# Patient Record
Sex: Male | Born: 1960 | Race: White | Hispanic: No | Marital: Married | Smoking: Never smoker
Health system: Southern US, Community
[De-identification: ages and names within clinical notes are randomized; demographics above are authoritative.]

## PROBLEM LIST (undated history)

## (undated) DIAGNOSIS — E785 Hyperlipidemia, unspecified: Secondary | ICD-10-CM

## (undated) DIAGNOSIS — I219 Acute myocardial infarction, unspecified: Secondary | ICD-10-CM

## (undated) DIAGNOSIS — I5022 Chronic systolic (congestive) heart failure: Secondary | ICD-10-CM

## (undated) DIAGNOSIS — I2510 Atherosclerotic heart disease of native coronary artery without angina pectoris: Secondary | ICD-10-CM

## (undated) DIAGNOSIS — Z95810 Presence of automatic (implantable) cardiac defibrillator: Secondary | ICD-10-CM

## (undated) HISTORY — DX: Hyperlipidemia, unspecified: E78.5

## (undated) HISTORY — DX: Acute myocardial infarction, unspecified: I21.9

## (undated) HISTORY — PX: CARDIAC CATHETERIZATION: SHX172

## (undated) HISTORY — DX: Atherosclerotic heart disease of native coronary artery without angina pectoris: I25.10

## (undated) HISTORY — DX: Chronic systolic (congestive) heart failure: I50.22

## (undated) HISTORY — PX: CAROTID STENT INSERTION: SHX5766

## (undated) HISTORY — DX: Presence of automatic (implantable) cardiac defibrillator: Z95.810

## (undated) HISTORY — PX: VASECTOMY: SHX75

## (undated) HISTORY — PX: CHOLECYSTECTOMY: SHX55

---

## 2002-08-27 ENCOUNTER — Ambulatory Visit (HOSPITAL_COMMUNITY): Admission: RE | Admit: 2002-08-27 | Discharge: 2002-08-27 | Payer: Self-pay | Admitting: Family Medicine

## 2002-08-27 ENCOUNTER — Encounter: Payer: Self-pay | Admitting: Family Medicine

## 2003-07-18 ENCOUNTER — Ambulatory Visit (HOSPITAL_COMMUNITY): Admission: RE | Admit: 2003-07-18 | Discharge: 2003-07-19 | Payer: Self-pay | Admitting: Interventional Cardiology

## 2005-11-24 DIAGNOSIS — I219 Acute myocardial infarction, unspecified: Secondary | ICD-10-CM

## 2005-11-24 HISTORY — PX: CORONARY ARTERY BYPASS GRAFT: SHX141

## 2005-11-24 HISTORY — DX: Acute myocardial infarction, unspecified: I21.9

## 2005-12-06 ENCOUNTER — Inpatient Hospital Stay (HOSPITAL_COMMUNITY): Admission: EM | Admit: 2005-12-06 | Discharge: 2005-12-24 | Payer: Self-pay | Admitting: Emergency Medicine

## 2005-12-06 ENCOUNTER — Ambulatory Visit: Payer: Self-pay | Admitting: Pulmonary Disease

## 2005-12-11 ENCOUNTER — Encounter (INDEPENDENT_AMBULATORY_CARE_PROVIDER_SITE_OTHER): Payer: Self-pay | Admitting: Interventional Cardiology

## 2006-01-09 ENCOUNTER — Encounter (HOSPITAL_COMMUNITY): Admission: RE | Admit: 2006-01-09 | Discharge: 2006-04-09 | Payer: Self-pay | Admitting: Interventional Cardiology

## 2006-04-26 HISTORY — PX: CARDIAC DEFIBRILLATOR PLACEMENT: SHX171

## 2006-05-16 ENCOUNTER — Inpatient Hospital Stay (HOSPITAL_COMMUNITY): Admission: RE | Admit: 2006-05-16 | Discharge: 2006-05-17 | Payer: Self-pay | Admitting: Cardiology

## 2007-12-03 ENCOUNTER — Encounter: Admission: RE | Admit: 2007-12-03 | Discharge: 2007-12-03 | Payer: Self-pay | Admitting: Gastroenterology

## 2008-03-01 ENCOUNTER — Encounter (INDEPENDENT_AMBULATORY_CARE_PROVIDER_SITE_OTHER): Payer: Self-pay | Admitting: General Surgery

## 2008-03-01 ENCOUNTER — Ambulatory Visit (HOSPITAL_COMMUNITY): Admission: RE | Admit: 2008-03-01 | Discharge: 2008-03-02 | Payer: Self-pay | Admitting: General Surgery

## 2008-03-10 ENCOUNTER — Observation Stay (HOSPITAL_COMMUNITY): Admission: RE | Admit: 2008-03-10 | Discharge: 2008-03-11 | Payer: Self-pay | Admitting: Internal Medicine

## 2008-03-10 ENCOUNTER — Ambulatory Visit: Payer: Self-pay | Admitting: Internal Medicine

## 2010-11-09 ENCOUNTER — Encounter (INDEPENDENT_AMBULATORY_CARE_PROVIDER_SITE_OTHER): Payer: Self-pay | Admitting: *Deleted

## 2010-11-13 NOTE — Letter (Signed)
Summary: Appointment - Reminder 2  [facility] [employer], Main Office  [address]   [place], [place] [address]   Phone: [phone]  Fax: [phone]     November 09, 2010 MRN: [id number]   NUSSEN PULLIN [address] [place], [place]  [address]   Dear Mr. Wildey,  Our records indicate that it is time to schedule a follow-up appointment.  Dr.Taylor recommended that you follow up with [place] in April. It is very important that we reach you to schedule this appointment. We look forward to participating in your health care needs. Please contact us at the number listed above at your earliest convenience to schedule your appointment.  If you are unable to make an appointment at this time, give [place] a call so we can update our records.     Sincerely,   Glass blower/designer

## 2011-01-08 NOTE — Op Note (Signed)
NAME:  [name] [name], [name] [name]             ACCOUNT NO.:  [id number]   MEDICAL RECORD NO.:  [id number]          PATIENT TYPE:  OIB   LOCATION:  6522                         FACILITY:  [facility]   PHYSICIAN:  Doylene Canning. [name], MD    DATE OF BIRTH:  [birth year]   DATE OF PROCEDURE:  03/10/2008  DATE OF DISCHARGE:                               OPERATIVE REPORT   PROCEDURE PERFORMED:  Implantable cardioverter-defibrillator lead  revision with insertion of a new lead implantable cardioverter-  defibrillator pocket revision and removal insertion of a previously  implanted defibrillator with defibrillation threshold testing.   INTRODUCTION:  The patient is a 50 year old man with a longstanding  coronary artery disease status post MI with shock resulted in bypass  surgery.  He is left with an EF of 25-30%.  The patient underwent  prophylactic defibrillator insertion not quite 2 years ago.  Less than a  month after his implant, it was learned that his defibrillator lead  which was a 69/49 Sprint Fidelis was under recall notification.  The  device was re-programmed and the patient was stable for almost 2 years.  The patient was subsequently alert and was found to have evidence of  pending lead fracture as he had had multiple short count at V to V  intervals and a marked change in his sensing parameters and is now  referred for ICD lead revision.  After careful discussion with the  procedure, it was deemed most appropriate to place a new defibrillator  lead provided that his left subclavian vein was still in fact patent and  utilize the old device for pacing, sensing, and defibrillation.   PROCEDURE:  After informed consent was obtained, the patient was taken  to the Diagnostic EP Lab in a fasting state.  After usual preparation  and draping, 10 mL of contrast was injected into the left upper  extremity venous system, in fact demonstrated a patent left subclavian  vein.  On accomplishing this, 30 mL of  lidocaine was infiltrated into  the left infraclavicular region.  A 7-cm incision was carried out over  this region and electrocautery was utilized to dissect down to the  fascial plane.  Care was taken not to enter the previously implanted ICD  pocket.  At this point, the left subclavian vein was punctured and the  Medtronic 6947 Sprint Quattro Secure active fixation defibrillation lead  was advanced into the right ventricle.  It should be noted that there  were fairly modest adhesions making passage of the defibrillation lead  more difficult than usual but after multiple attempts, the lead was able  to be passed without complication.  The defibrillator lead was  subsequently advanced into the right ventricle.  The old device was  placed into the floor of the right ventricle and then the new device was  placed on the RV septum where the R-waves with the lead actively fixed  were 14 mV and the pacing impedance was 600 ohms.  The threshold was 0.7  volts at 0.5 milliseconds.  A 10-volt pacing did not stimulate the  diaphragm.  With the defibrillator lead  in satisfactory position, it was  secured to the subpectoralis fascia with a figure-of-eight silk suture.  The sewing sleeve was also secured with silk suture.  At this point, the  pocket was irrigated with kanamycin and next, the electrocautery was  utilized to enter the subcutaneous pocket.  The defibrillator pocket was  revised to accommodate the additional defibrillation lead.  The old  defibrillation lead was capped at all three ports and the old lead  placed back in the subcutaneous pocket.  There was a moderate amount of  adhesions in the pocket on the lead.  At this point, pocket revision was  carried out to accommodate the additional defibrillation lead and  electrocautery was utilized to assure hemostasis.  The pocket was then  irrigated with copious amounts of kanamycin.  The Medtronic Virtuoso  single chamber defibrillator which  had been previously removed from the  pocket was then reinserted into the pocket into the new defibrillation  lead.  The pocket was irrigated with kanamycin.  The patient was deeply  sedated with fentanyl, Versed, and Valium for defibrillation threshold  testing.   After the patient was deeply sedated with fentanyl, Versed and Valium,  VF was subsequently induced with a T-wave shock and a 15 joules shock  was delivered which terminated VF and restored sinus rhythm.  No  additional defibrillation threshold testing was carried out at this  point.  The incision was closed with 2-0 Vicryl, followed by layer of 3-  0 Vicryl.  Benzoin was painted on the skin.  Steri-Strips were placed.  A pressure dressing was made and manual pressure was held for at least 5  minutes after which a very tight pressure dressing was placed and the  patient was returned to his room in satisfactory condition.   COMPLICATIONS:  There were no immediate procedural complications.   RESULTS:  Demonstrated successful removal and insertion of the previous  Medtronic Virtuoso defibrillator along with defibrillation threshold  testing and pocket revision and insertion of a new defibrillator lead in  a patient whose previous defibrillator lead had shown evidence of  impending fracture based on changes in the pacing parameters and  multiple short counted V to V intervals.      Doylene Canning. [name], MD  Electronically Signed     GWT/MEDQ  D:  03/10/2008  T:  03/11/2008  Job:  [id number]   cc:   Francisca December, M.D.  Lyn Records, M.D.

## 2011-01-08 NOTE — Op Note (Signed)
NAME:  [name] [name], [name] [name]             ACCOUNT NO.:  [id number]   MEDICAL RECORD NO.:  [id number]          PATIENT TYPE:  OIB   LOCATION:  3729                         FACILITY:  [facility]   PHYSICIAN:  Angelia Mould. Derrell Lolling, M.D.DATE OF BIRTH:  11-11-1960   DATE OF PROCEDURE:  03/01/2008  DATE OF DISCHARGE:                               OPERATIVE REPORT   PREOPERATIVE DIAGNOSIS:  Chronic cholecystitis with cholelithiasis.   POSTOPERATIVE DIAGNOSIS:  Chronic cholecystitis with cholelithiasis.   OPERATION PERFORMED:  Laparoscopic cholecystectomy with intraoperative  cholangiogram.   SURGEON:  Angelia Mould. Derrell Lolling, MD   FIRST ASSISTANT:  Gabrielle Dare. Janee Morn, MD   OPERATIVE INDICATION:  This is a 50 year old white man who has a past  history of acute myocardial infarction, cardiogenic shock, emergent  coronary artery bypass grafting in 2007, also with a pacemaker and  internal defibrillator in place.  Other than that, he has been extremely  healthy except for the past 3-1/2 months he had been having intermittent  episodes of biliary colic which had been fairly classic in nature.  Dr.  Dorena Cookey evaluated him, and a gallbladder ultrasound revealed several  gallstones and borderline thickening of the gallbladder wall.  He has  had normal liver function test.  Dr. Verdis Prime has cleared him for  surgery, and he has been off his Plavix for 7 days and aspirin for 3-4  days and is brought to the operating room electively.   OPERATIVE FINDINGS:  The gallbladder was chronically inflamed.  There  were moderately severe adhesions to the body and infundibulum of the  gallbladder that took about 15 minutes to take down.  The liver,  stomach, duodenum, small intestine, and large intestine were otherwise  normal to inspection.  The cholangiogram was normal, showing normal  intrahepatic and extrahepatic bile ducts, no filling defects, and no  obstruction with good flow of contrast into the duodenum.   OPERATIVE TECHNIQUE:  Following the induction of general endotracheal  anesthesia, the patient was identified as to correct patient and correct  procedure.  The abdomen was prepped and draped in sterile fashion.  Intravenous antibiotics were given prior to the incision.  A 0.5%  Marcaine with epinephrine was used as a local infiltration anesthetic.  A vertically-oriented incision was made inside the lower rim of the  umbilicus.  The fascia was incised in the midline and the abdominal  cavity was entered under direct vision.  A 10-mm Hasson trocar was  inserted and secured with a pursestring suture of 0 Vicryl.  Pneumoperitoneum was created.  Video camera was inserted with  visualization and findings as described above.  A 10-mm trocar was  placed in the subxiphoid region through an old scar where he had had a  chest tube.  That insertion was uneventful.  Two 5-mm trocars were  placed in the right upper quadrant.  We could clearly identify the  fundus of the gallbladder.  We very carefully took down all of the  chronic adhesions, taking the duodenum and transverse colon and omentum  off the gallbladder.  This was done without any problem.  We ultimately  were able to identify the infundibulum and elevated and retracted  laterally.  We then incised the peritoneum on the right and the left of  the neck of the gallbladder.  We dissected out the cystic duct and  created a large window behind it.  We could visualize the common bile  duct.  A cholangiogram catheter was inserted into the cystic duct.  A  cholangiogram was obtained using the C-arm.  The cholangiogram was  normal as described above.  The cholangiogram catheter was removed, the  cystic duct secured with multiple metal clips and divided.  We then  isolated the cystic artery.  We actually isolated the anterior branch  and the posterior branch separately, secured these with metal clips and  divided these.  We were then able to dissect  the gallbladder with  electrocautery out of its bed, placed it in a specimen bag, and removed  it.  The operative field and subphrenic space were copiously irrigated  with saline.  At the completion of the case, there was no bleeding and  no bile leak whatsoever.  The irrigation fluid was completely clear.  The trocars were removed under direct vision.  There was no bleeding  from the trocar sites.  The pneumoperitoneum was released.  The fascia  at the umbilicus was closed with 0 Vicryl sutures in the skin.  Incisions were all closed with subcuticular sutures of 4-0 Monocryl and  Dermabond.  Clean bandages were placed and the patient was taken to  recovery room in stable condition.  Estimated blood loss was about 10-20  mL.  Complications none.  Sponge, needle, and instrument counts were  correct.      Angelia Mould. Derrell Lolling, M.D.  Electronically Signed     HMI/MEDQ  D:  03/01/2008  T:  03/02/2008  Job:  [id number]   cc:   Everardo All. Madilyn Fireman, M.D.  Holley Bouche, M.D.  Lyn Records, M.D.

## 2011-01-08 NOTE — Discharge Summary (Signed)
NAME:  [name] [name], [name] [name]             ACCOUNT NO.:  [id number]   MEDICAL RECORD NO.:  [id number]          PATIENT TYPE:  OIB   LOCATION:  6522                         FACILITY:  [facility]   PHYSICIAN:  Doylene Canning. [name], MD    DATE OF BIRTH:  Jan 30, 1961   DATE OF ADMISSION:  03/10/2008  DATE OF DISCHARGE:  03/11/2008                               DISCHARGE SUMMARY   Given this patient has allergy to PENICILLIN, any type of sedation gives  the patient intractable nausea.  On this admission, he was treated with  both IV Phenergan and scopolamine patch with very light conscious  sedation.   TIME FOR THIS DICTATION AND EXAM:  Greater than 35 minutes.   FINAL DIAGNOSES:  1. Discharging day #1 status post implantation of a new right      ventricular lead.      a.     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      b.     Defibrillator threshold study done this admission less than       or equal to 15 joules.  2. The patient has Medtronic Virtuoso cardioverter-defibrillator      implanted, September 2007.   SECONDARY DIAGNOSES:  1. History of a prior stent to the left anterior descending, November      2004.  2. Acute anterior myocardial infarction/cardiogenic shock/emergent      coronary artery bypass graft, April 2007.  3. Implant of Medtronic Virtuoso implantable cardioverter-      defibrillator, September 2007.  4. Stress test in the fall of 2008, ejection fraction 28%.  5. New York Heart Association class II chronic systolic congestive      heart failure.  6. Nonsustained ventricular tachycardia.  7. Treated hypothyroidism.  8. Dyslipidemia.   PROCEDURES ON THIS ADMISSION:  On March 10, 2008, disconnect of defective  6949 right ventricular lead with placement of a new right ventricular  lead with reattachment to the patient's Medtronic Virtuoso cardioverter-  defibrillator.  Defibrillator threshold study less than or equal to 15  joules,  Dr. Lewayne Bunting.  The patient has had no postprocedural  complications.   BRIEF HISTORY:  Mr. Jafri is a 50 year old male.  He called his  office, [facility], when his device started beeping at 4 o'clock  in the morning and woke him up.  The patient has a stent to the LAD in  November 2004.  He had coronary artery bypass graft surgery in the  setting of anterior myocardial infarctions and cardiogenic shock in  April 2007.  He had a defibrillator implanted, a Medtronic Virtuoso  single-chamber defibrillator in September 2007 by Dr. Amil Amen.  His  ejection fraction had not improved after the bypass surgery according to  a MUGA scan in September 2007 when it was 28%.   The patient has a device with a 6949 lead.  This morning, he reports it  started beating, he called the [facility] office.  Telephony  showed that the right ventricular lead was defective.  The patient was  advised  to come to Short-Stay C and be seen by [facility] for  right ventricular lead revision.   Despite the patient's prior history, stress test in the fall of 2008  showed continued decreased ejection fraction of 28%; however, he is able  to do just what I want to do.  He uses a treadmill, lifts weights, and  goes fishing.  He has New York Heart Association class II chronic  systolic congestive heart failure.  Prior to this admission, he has not  been having chest pain, dyspnea on exertion, and palpitation.  He did  not receive an ICD discharge.  The patient has allergy to PENICILLIN.  Our plan is to revise the left ventricular lead on March 10, 2008.   HOSPITAL COURSE:  The patient presents to Short-Stay C at [facility] on March 10, 2008, with defective [phone] right ventricular lead in  his cardioverter-defibrillator, this was revised by Dr. [name].  The  same day the patient has had no postprocedural complications.  He is on  Plavix, this was held the day of the procedure and it will  continue to  be on hold until Wednesday, March 16, 2008, then he can restart.  The  patient had defibrillator threshold study, which was less than or equal  to 15 joules.  The patient had some NSVT in the postoperative and some  multifocal PVCs.  With these, he has been totally asymptomatic.  Incision has been inspected and shows no obvious hematoma.  The patient  has an incision check scheduled for Wednesday, March 30, 2008, at 9  o'clock at [facility].  He was asked to keep his incision dry  for next 7 days and to sponge bath until Thursday, March 17, 2008.  Chest  x-ray has been examined and shows that the lead is in appropriate  position.  The device has been interrogated by the Medtronic technical  representative and shows that all parameters are within normal limits.   The patient discharges on his following medications:  1. Coreg 12.5 mg twice daily.  2. Enteric-coated aspirin 325 mg daily.  3. Levothyroxine 25 mcg daily.  4. Diovan 80 mg every evening.  5. Remeron 40 mg at bedtime.  6. He is to continue fish oil tabs.  7. Methocarbamol 750 mg, he takes this as before as needed.  8. Hold Plavix 75 mg daily until Wednesday, March 16, 2008, then      restart.  9. New medication is Cipro 5 mg one tab in the morning and one tab in      the evening for the next 5 days.  10.Crestor 20 mg daily at bedtime.   LABORATORY STUDIES THIS ADMISSION:  White cells of 5.7, hemoglobin 15.1,  hematocrit 42.7, and platelets of 185,000.  Sodium is 140, potassium  3.9, chloride 108, carbonate 25, BUN is 12, creatinine 1.14, glucose  104, protime 13.3, and INR 1.0.      Maple Mirza, PA      Doylene Canning. [name], MD  Electronically Signed    GM/MEDQ  D:  03/11/2008  T:  03/12/2008  Job:  [id number]   cc:   Lewayne Bunting, MD  Holley Bouche, M.D.  Lyn Records, M.D.

## 2011-01-11 NOTE — Op Note (Signed)
NAMEWINNIE, [name]             ACCOUNT NO.:  [id number]   MEDICAL RECORD NO.:  [id number]          PATIENT TYPE:  INP   LOCATION:  2001                         FACILITY:  [facility]   PHYSICIAN:  Francisca December, M.D.  DATE OF BIRTH:  June 01, 1961   DATE OF PROCEDURE:  05/16/2006  DATE OF DISCHARGE:  05/17/2006                                 OPERATIVE REPORT   PROCEDURE:  Insertion implantable cardiac defibrillator.   PROCEDURE PERFORMED:  1. Insertion single chamber implantable cardiac defibrillator.  2. Left subclavian venogram.  3. Defibrillation threshold testing.   INDICATION:  [name] [name] is a 50 year old man who is approximately 6  months SP of an acute anterolateral wall myocardial infarction with emergent  coronary bypass surgery; and a resultant severe ischemic cardiomyopathy LVEF  is recently tested and found to be 28% by MUGA. The patient does not display  any symptoms of heart failure; and his QRS duration is narrow.  He is  brought to the catheterization laboratory, at this time, for insertion of a  single-chamber implantable cardiac defibrillator as well as defibrillation  threshold testing.   PROCEDURAL NOTE:  The patient is brought to cardiac catheterization  laboratory in the fasting state.  The left prepectoral region was prepped  and draped in the usual sterile fashion.  Local anesthesia was obtained with  infiltration of 1% lidocaine with epinephrine throughout the left  prepectoral region.  A 6-to-7-cm incision was then made in the deltopectoral  groove; and this was carried down by sharp dissection and electrocautery to  the prepectoral fascia.  There a plane was lifted and a pocket formed  inferiorly and medially.  Hemostasis was obtained and the pocket was packed  with 1% kanamycin soaked gauze.   A single left subclavian puncture was then performed with an 18-gauge thin-  walled needle through which was passed a 0.038-inch tight J-guidewire.  Over  this guidewire a 9-French tearaway sheath and dilator were advanced.  The  dilator was removed; and the wire was allowed to remain in place.  The  ventricular lead was advanced to the level of the right atrium; and the  sheath was torn away.  A figure-of-eight hemostasis suture was placed around  the insertion site in the pectoralis muscle.  Using standard technique and  fluoroscopic landmarks.  The lead was manipulated into the right ventricular  apex.  This was an active fixation device and the screw was advanced as  appropriate.  The lead was then tested for adequate pacing parameters as  will be noted below.  It was also tested for diaphragmatic pacing at 10  volts and none was found.  The lead was then sutured into place using 3  separate #0 silk ligatures.  The patient was then given a doses of narcotics  and anxiolytics sufficient to induce a light anesthetic state.  Defibrillation threshold testing was then carried out.   Ventricular fibrillation was induced by the shock on T-technique.  This  resulted in prompt the VF.  Detection required 3.5 seconds.  Charging  required for 4.5 seconds.  A 20 joules shock was  delivered with prompt  return of sinus rhythm at 43 ohms impedance;  there was one dropout noted.  We then continued to close the pocket with 2-0 Vicryl in a running  subcuticular fashion.  Two layers were applied.  The skin was approximated  using 4-0 Vicryl in a running subcuticular fashion.  Steri-Strips and a  sterile dressing were applied; and the patient underwent a second  defibrillation threshold testing.  Again, induced by the shock on T-  technique.  Then 20 joules were delivered for a total of 8 seconds.  Sinus  rhythm promptly returned to 43 ohms, 2 dropouts noted at  1.2 mV  sensitivity.   The final device settings were a VF zone at greater than 200 beats per  minute, and a VT monitor zone from 170-200.  No therapy at less than 200  beats per minute.   It  should be noted that a left subclavian venogram was performed, after  anesthetic was applied.  Then 20 mL of Omnipaque were injected via the  peripheral IV.  Digital cine angiograms was obtained in AP projection; and  road mapped to guide future left subclavian puncture.  The venogram did  demonstrate the vein to be widely patent, and coursing in a normal fashion  over the anterior surface of the first rib and beneath the middle third of  the clavicle.  There was no evidence for persistence of the left superior  vena cava.   EQUIPMENT DATA:  The pace shock generator is a Medtronic Virtuoso model  number D-154-VWC serial number [id number].  The ventricular lead is a  Medtronic model number [id number], serial number [id number].   PACING DATA:  The ventricular lead detected a 14.0 millivolt R wave.  The  pacing threshold was 1.0 volts at 0.5 milliseconds pulse width.  The  impedance was 879 ohms.  Resulting in a current captured threshold of 1.4  MA.      Francisca December, M.D.  Electronically Signed     JHE/MEDQ  D:  05/16/2006  T:  05/19/2006  Job:  [id number]   cc:   Holley Bouche, M.D.

## 2011-01-11 NOTE — Cardiovascular Report (Signed)
NAME:  [name] [name], [name] [name]                       ACCOUNT NO.:  [id number]   MEDICAL RECORD NO.:  [id number]                   PATIENT TYPE:  OIB   LOCATION:  2866                                 FACILITY:  [facility]   PHYSICIAN:  Lesleigh Noe, M.D.            DATE OF BIRTH:  [birth year]   DATE OF PROCEDURE:  07/18/2003  DATE OF DISCHARGE:                              CARDIAC CATHETERIZATION   INDICATIONS:  Abnormal Cardiolite, recent chest discomfort.  Cardiolite  suggests anterior wall mild ischemia.   PROCEDURE PERFORMED:  1. Left heart.  2. Coronary angiogram.  3. Left ventriculography.  4. Ostial left anterior descending stent.  5. Intravascular ultrasound.   DESCRIPTION OF PROCEDURE:  After informed consent, a 6-French sheath was  placed in the right femoral artery using the modified Seldinger technique.  A 6-French A2 multipurpose catheter was used for hemodynamic recordings,  left ventriculography, selective left and right coronary angiography.  The  patient tolerated the procedure without complications.  After reviewing the  cineangiograms it was felt that percutaneous intervention was indicated on  the significant proximal LAD that was graded to be approximately 70%.   Intravenous bolus (double) followed by an infusion of Integrilin and heparin  was administered.  ACT was 246 seconds prior to starting the procedure.  An  additional 1000 units of heparin was given near the end of the procedure.  We used a 6-French #4 left Judkins guide catheter, an Asahi medium  guidewire.  We direct stented with a 16 mm long Taxus 3.0 mm diameter stent.  We then post dilated with a 3.75 mm diameter PowerSail balloon to peak  pressure of 13 atmospheres.  The patient tolerated the procedure without  complications.  He did not have chest discomfort.  The peak pressure with  the PowerSail balloon was 13 atmospheres for 27 seconds.  Angiographic  appearance was excellent.  Because of concern  that there may be overhang of  stent into the left main, intravascular ultrasound was performed using the  Scimed system.  Intravascular ultrasound demonstrated symmetrical stent  expansion with greater than 3.25 diameter stent throughout the stent  including proximally and there was no definite documentation of any  significant stent overhang into the left main.  It is difficult to interpret  these images because of cardiac cycle related movement of the intravascular  ultrasound catheter.  ACT post procedure was 270 seconds.   RESULTS:   I. HEMODYNAMIC DATA:  A.  Aortic pressure 125/79.  B.  Left ventricular pressure 122/11 mmHg.   II. LEFT VENTRICULOGRAPHY:  The left ventricle is normal in size and  demonstrates normal contractility.  EF 50%.   III. CORONARY ANGIOGRAPHY:  A.  Left main coronary:  Normal.  B.  Left anterior descending coronary:  The proximal/ostial LAD contains 75%  eccentric narrowing.  The lesion appears to involve the very distal portion  of the ostial of the left main superiorly.  LAD is large, gives origin to a  large diagonal.  No other significant disease.  C.  Circumflex artery:  Normal.  D.  Right coronary:  Dominant and entirely normal with large inferior wall  branch and left ventricular branch.   IV. PERCUTANEOUS CORONARY INTERVENTION:  75% proximal LAD was reduced to 0%  after stenting with a post stent deployment balloon size of 3.75 mm that  extended to the ostium of the LAD.   V. INTRAVASCULAR ULTRASOUND:  Intravascular ultrasound was performed post  procedure because of concern about stent overhang into the left main.  I was  not able to definitively demonstrate any significant overhang of the stent  into the left main.  This evaluation was difficult because of IVUS catheter  movement with the cardiac cycle.  Stent was well opposed at all segments  noted with the smallest apposition diameter being 3.4 x 3.2.  The distal  vessel reference was 3.4  x 3.2 mm.   CONCLUSION:  1. Coronary artery disease with 70% ostial left anterior descending.  2. Successful drug-eluting Taxus stent implantation in the proximal and     ostial left anterior descending with reduction of stenosis to 0%.  3. Successful intravascular ultrasound without demonstration of any     appreciable stent overhang into the left main.  4. Normal circumflex and right coronary.  5. Normal left ventricular function.   PLAN:  Plavix for one year.  Aspirin indefinitely.  Lipitor 10 mg daily.                                               Lesleigh Noe, M.D.    HWS/MEDQ  D:  07/18/2003  T:  07/18/2003  Job:  [id number]   cc:   Holley Bouche, M.D.  [address]  [place], [place] [address]  Fax: [phone]

## 2011-01-11 NOTE — H&P (Signed)
[name] [name], PALMERI             ACCOUNT NO.:  [id number]   MEDICAL RECORD NO.:  [id number]          PATIENT TYPE:  INP   LOCATION:  2807                         FACILITY:  [facility]   PHYSICIAN:  Corky Crafts, MDDATE OF BIRTH:  Apr 04, 1961   DATE OF ADMISSION:  12/06/2005  DATE OF DISCHARGE:                                HISTORY & PHYSICAL   REFERRING PHYSICIAN:  Dr. Vanetta Mulders   PRIMARY PHYSICIAN:  Dr. Leonides Sake   CHIEF COMPLAINT:  Chest pain.   HISTORY OF PRESENT ILLNESS:  The patient is a 50 year old male with prior  coronary artery disease including an ostial LAD stent who had chest pain  starting around 5 p.m.  His ECG from the ambulance showed anterior ST  segment elevation.  The patient was noted to be hypotensive and brought  emergently to the cardiac lab.   PAST MEDICAL HISTORY:  Past medical history significant for coronary artery  disease.   PAST SURGICAL/FAMILY/SOCIAL HISTORY, MEDICATIONS/ALLERGIES AND REVIEW OF  SYSTEMS:  We are unable to obtain a past surgical, family, social history,  medications, allergies and review of systems because of the patient's  critical condition and his inability to answer questions.   PHYSICAL EXAMINATION:  VITAL SIGNS:  Blood pressure 85/60, heart rate of 95  in the emergency room.  GENERAL:  He is awake and clearly in pain.  He is very pale and ill  appearing.  CARDIOVASCULAR EXAM:  Regular rate and rhythm, S1, S2.  No murmurs.  EXTREMITIES:  No edema.  1+ femoral pulsese bilaterally   Physical exam was limited because the patient was being brought emergently  to the cath lab.   LABORATORIES:  Labs are unavailable.   ELECTROCARDIOGRAM:  Normal sinus rhythm with anterior ST elevation and  inferior ST depression.   ASSESSMENT:  A 50 year old with acute anterior myocardial infarction.   PLAN:  Emergent cardiac cath.  We will use heparin, Integrilin, and aspirin.  Further plans will be determined by the success of  revascularization.  No  beta blocker at this time because the patient may be in cardiogenic shock  and is relying on the relative tachycardia.     Corky Crafts, MD  Electronically Signed    JSV/MEDQ  D:  12/06/2005  T:  12/06/2005  Job:  [id number]

## 2011-01-11 NOTE — Cardiovascular Report (Signed)
[name], [name]             ACCOUNT NO.:  [id number]   MEDICAL RECORD NO.:  [id number]          PATIENT TYPE:  INP   LOCATION:  2807                         FACILITY:  [facility]   PHYSICIAN:  Corky Crafts, MDDATE OF BIRTH:  [birth year]   DATE OF PROCEDURE:  12/06/2005  DATE OF DISCHARGE:                              CARDIAC CATHETERIZATION   PROCEDURES PERFORMED:  Coronary angiogram, percutaneous transluminal  coronary angiography of the left main and left anterior descending,  aspiration thrombectomy of the left main, left anterior descending, and left  circumflex, Rheolytic thrombectomy, intra-aortic balloon placement and  transvenous pacemaker placement.   OPERATOR:  Corky Crafts, M.D.  [name] [name], M.D.   INDICATIONS:  Anterior ST elevation with cardiogenic shock.   PROCEDURE NARRATIVE:  The patient was brought emergently to the cardiac cath  lab from the ER because of his critical condition. He was prepped and draped  in a sterile fashion.  His right groin was infiltrated with 1% lidocaine. A  6-French arterial sheath was placed in his right femoral artery using  modified Seldinger technique.  Right coronary artery angiography was  performed with a JR-4 catheter. The catheter was advanced to the vessel  ostium under fluoroscopic guidance. Digital angiography was performed using  hand injection of contrast. We then inserted a CLS-3.5 guide.  This was  advanced to the left main. The initial picture showed an occluded distal  left main artery. The patient was given heparin and Integrilin. He was  subsequently intubated because of an increasing oxygen requirement.  A  Prowater wire was advanced into the circumflex, a whisper wire was then  advanced into the LAD. Aspiration thrombectomy was attempted with a Diver  catheter but there was no successful removal of thrombus.  A 2.5 x 12  Voyager balloon was then advanced to the distal left main and LAD and  inflated to 10 atmospheres.  There was some improvement in flow.  TIMI I  flow was noted in the LAD at that time.  The left groin was then prepped for  intra-aortic balloon pump. The intra-aortic balloon pump was placed under  fluoroscopic guidance in the left groin.  The export catheter was then  advanced to the ostium of the left main and aspiration thrombectomy was  performed.  There was some removal of thrombus after several runs.  Aspiration thrombectomy was then performed in the circumflex. Venous access  was obtained. A temporary pacemaker wire was then placed in the right  femoral vein and RV pacing was performed in preparation for Rheolytic  thrombectomy. The AngioJet was prepared and the AngioJet was then run into  the left circumflex artery with some resolution of thrombus. At the time the  intra-aortic balloon pump was placed, Dr. Dorris Fetch from CT surgery was  consulted emergently. Given the large amount of thrombus and impaired blood  flow that still remained and the patient's critical condition, the decision  was made to taken him for emergent bypass surgery.   IMPRESSION:  1.  Distal left main occlusion.  2.  Cardiogenic shock.  3.  Restoration of some flow  into the left coronary system using      angioplasty, aspiration thrombectomy, Rheolytic thrombectomy, and      balloon pump placement.   RECOMMENDATIONS:  The patient will be continued on anticoagulation and taken  emergently to bypass surgery. I had a discussion with the family regarding  how critical his condition is and that his prognosis is poor at this point.      Corky Crafts, MD  Electronically Signed     JSV/MEDQ  D:  12/06/2005  T:  12/07/2005  Job:  [id number]   cc:   Holley Bouche, M.D.  Fax: [phone]   Lyn Records, M.D.  Fax: [phone]

## 2011-01-11 NOTE — Op Note (Addendum)
NAME:  [name] [name], [name] [name]             ACCOUNT NO.:  [id number]   MEDICAL RECORD NO.:  [id number]          PATIENT TYPE:  INP   LOCATION:  2310                         FACILITY:  [facility]   PHYSICIAN:  Salvatore Decent. Dorris Fetch, M.D.DATE OF BIRTH:  12/08/1960   DATE OF PROCEDURE:  12/06/2005  DATE OF DISCHARGE:                                 OPERATIVE REPORT   ____ QA MARKER: 5#1:Courtesy Copy.                                 PREOPERATIVE DIAGNOSIS:  Distal left main thrombosis with acute MI and  cardiogenic shock.   POSTOPERATIVE DIAGNOSIS:  Distal left main thrombosis with acute MI and  cardiogenic shock.   PROCEDURE:  Emergency salvage median sternotomy, extracorporeal circulation,  coronary bypass grafting x3 (saphenous vein graft to LAD, sequential  saphenous vein graft to ramus intermedius, obtuse marginal 1), endoscopic  vein harvest right leg.   SURGEON:  Salvatore Decent. Dorris Fetch, M.D.   ASSISTANT:  Pecola Leisure, PA   ANESTHESIA:  General.   FINDINGS:  Good-quality vein, good-quality targets, large anterior and  apical infarct with hemorrhage and edema, good wall motion by echocardiogram  of the posterolateral walls and inferior wall.  No movement of the anterior  wall and apex.  Cardiogenic shock on arrival. Post bypass good cardiac  output, but still no wall motion of the anterior wall and apex.   CLINICAL NOTE:  [name] [name] is a 50 year old gentleman who presented to  the emergency room on 12/06/2005 with acute myocardial infarction.  He was  taken emergently cardiac catheterization laboratory where he was found to  have a distal left main occlusion. Dr. Eldridge Dace and Amil Amen were able to  establish some degree of flow into the left side circulation.  There was no  significant disease in the right coronary. The patient was in cardiogenic  shock despite balloon pump and pressor support.  He was intubated with  fulminant pulmonary edema with pink frothy fluid pouring from  the  endotracheal tube.  The patient was unable to respond but the patient's  family was advised of the need for emergent coronary artery bypass grafting  for possible salvage.  They understood that the chances of success were less  than 50%.  The family consented and the patient was taken emergently from  the catheterization laboratory to the operating room as soon as the  operating team arrived.  The patient was already intubated and anesthetized  and the patient was prepped and draped as the anesthesia service was placing  a neck line and Swan-Ganz catheter.  A Foley catheter was already in place  from the cath lab.   A median sternotomy was performed and the patient was fully heparinized.  Simultaneously incision made in the medial aspect of the right knee. Greater  saphenous vein was identified and was harvested endoscopically.  This went  very smoothly was done as rapidly as an open harvest would have been able to  have been done.  The pericardium was opened.  The ascending  aorta was  cannulated via concentric 2-0 Ethibond pledgeted pursestring sutures.  A  dual stage venous cannula placed via pursestring suture in the right atrial  appendage.  As soon as the ACT was adequate, cardiopulmonary bypass was  instituted and the patient was cooled to 32 degrees Celsius. While waiting  on the vein harvest, the coronary arteries were inspected and anastomotic  sites were chosen.  Conduits were inspected and cut to length once the vein  was available.  The foam pad was placed in the pericardium to protect the  left phrenic nerve.  A temperature probe was placed in the myocardial  septum.  A retrograde cardioplegic cannula placed in a pursestring suture in  the right atrium and directed in the coronary sinus and antegrade  cardioplegic cannula placed in the ascending aorta.   The aorta was crossclamped, left ventricle was emptied via aortic root vent.  Cardiac arrest was achieved with a  combination of cold antegrade and  retrograde blood cardioplegia and topical iced saline.  After achieving  complete diastolic arrest and adequate myocardial septal cooling, the  following distal anastomoses were performed.   First a reverse saphenous vein graft was placed end-to-side the LAD.  The  LAD was a 2-mm good-quality conduit.  The saphenous vein was a good-quality  conduit.  The anastomosis was performed with running 7-0 Prolene suture.  There was excellent flow through the graft.  Cardioplegia was administered  down this graft.   Next a reverse saphenous vein graft placed sequentially to the ramus  intermedius and obtuse marginal 1 branches.  These were both 1.5 mm good-  quality targets.  The vein was good quality.  Both anastomoses were  performed with a running 7-0 Prolene sutures.  Both were probed proximally  and distally.  There was excellent flow through the graft.  Cardioplegia was  administered.   Next the cardioplegic cannula was removed from the ascending aorta.  The  proximal vein graft anastomoses performed to 4.5 mm punch aortotomies with  running 6-0 Prolene sutures.  At completion of the final proximal  anastomosis the patient was placed in Trendelenburg position.  A warm dose  of cardioplegia was administered retrograde.  500 mL of this hot shot was  administered.  The aortic root was de-aired.  The aortic crossclamp was  removed.  The total crossclamp time was 53 minutes.   While the patient was being rewarmed, all proximal and distal anastomoses  were inspected for hemostasis.  Epicardial pacing wires were placed on the  right ventricle and right atrium.  When the patient had rewarmed to a core  temperature of 37 degrees Celsius, he was started on a dopamine drip.  The  patient then was weaned from cardiopulmonary bypass with the intra-aortic  balloon pump assistance at one-to-one.  The patient weaned from bypass without difficulty.  Initial cardiac index  was greater than 2 liters per  minute per meter squared.  The patient did have profound hypotension  requiring Neo-Synephrine and eventually epinephrine to treat his peripheral  vasodilatation.  Postbypass transesophageal echocardiography revealed good  wall motion of the posterior and lateral wall.  There was akinesis of the  anterior wall and apex and septum.  A test dose of protamine was  administered and was well tolerated.  The atrial and aortic cannulae were  removed.  The remainder of the protamine was administered without incident.  The chest was irrigated with 1 liter of normal saline containing 1 gram of  vancomycin.  Hemostasis was achieved.  Two mediastinal chest tubes were  placed through separate subcostal incisions.  The sternum was closed with  interrupted heavy gauge stainless steel wires.  The remainder of the  incisions closed standard fashion.  Subcuticular closure used for the skin.  An x-ray was done in the operating room because of the  hurried nature of the procedure and possibility of an incorrect count.  It  revealed no retained instruments or sponges however, there was white out of  both lungs bilaterally due to severe pulmonary edema.  The patient was  transported from the operating room to the surgical intensive care unit in  critical and unstable condition.           ______________________________  Salvatore Decent Dorris Fetch, M.D.     SCH/MEDQ  D:  12/10/2005  T:  12/11/2005  Job:  [id number]   cc:   Lyn Records, M.D.  Fax: [phone]

## 2011-01-11 NOTE — Discharge Summary (Signed)
NAMEMarland Kitchen  [name] [name] [name] [name] [name], [name] [name] [name] [name] [name]             ACCOUNT NO.:  [id number]   MEDICAL RECORD NO.:  [id number]          [name] [name] TYPE:  INP   LOCATION:  2039                         FACILITY:  [facility]   PHYSICIAN:  [name] [name] [name] [name] [name]. 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   DISCHARGE/SECONDARY DIAGNOSES:  1.  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  2.  Postoperative acute hypoxic respiratory failure secondary to pulmonary      edema with postoperative ventilator-dependent respiratory failure and      ARDS syndrome, resolved.  3.  Cardiogenic shock secondary to acute myocardial infarction.  4.  Postoperative lactic acidosis secondary to poor perfusion, resolved.  5.  Postoperative acute blood loss anemia, improved.  6.  Postoperative coagulopathy with thrombocytopenia, corrected.  7.  Postoperative delirium and agitation, improved.  8.  Postoperative relative adrenal insufficiency, improved.  9.  Postoperative hypoglycemia, improved.  10. Postoperative fluid volume excess/congestive heart failure, improving.  11. Postoperative rapid atrial fibrillation, now sinus rhythm now      postoperative azotemia, improved.  12. Coagulase-negative Staphylococcus blood culture x1 (question for      contamination) status post antibiotics with follow-up cultures negative.  13. Allergy to PENICILLIN.  14. Postoperative elevated liver function studies.   PROCEDURES:  1.  December 06, 2005:  Emergent coronary angiogram with percutaneous      transluminal coronary angiography of [name] [name] [name] left main and left anterior      descending, aspiration thrombectomy of [name] [name] [name] left main, left anterior      descending, and [name] [name] [name] left circumflex,  rheolytic  thrombectomy, intra-      aortic balloon placement, and transvenous pacemaker placement by Dr.      Lance [name] and Dr. Corliss [name] [name].  2.  December 06, 2005:  Emergency salvage median sternotomy, extracorporal      circulation, coronary artery bypass grafting x3 using saphenous vein      graft to [name] [name] [name] LAD, sequential saphenous vein graft to [name] [name] [name] ramus      intermedius, and obtuse marginal 1, endoscopic vein harvest from [name] [name] [name]      right leg.  Surgeon Dr. Charlett [name].  3.  August 07, 2006:  Intraoperative transesophageal echocardiogram by Dr.      Bedelia [name] [name].  See data report for details.  4.  December 10, 2005:  Right pleural PICC line insertion by IV nurse.  5.  December 18, 2005:  Bedside swallow study by speech pathology.   BRIEF HISTORY:  [name] [name] [name] [name] [name] is a 50 year old male with prior coronary  artery disease including ostial LAD stent who had chest pain around 5 p.m.  on December 08, 2005. His ECG from [name] [name] [name] ambulance showed anterior ST segment  elevation.  On arrival to [name] [name] [name] ED [name] [name] [name] [name] [name] was noted to be hypotensive and  was evaluated by [name] [name] [name] cardiologist, Dr. Eldridge [name] and  was taken emergently to  [name] [name] [name] cardiac catheterization laboratory where he was found to have distal  left main occlusion.  Cardiologist was able to get flow to [name] [name] [name] left side  circulation.  There was no significant disease in [name] [name] [name] right coronary artery.  [name] [name] [name] [name] [name] had left main cardiogenic shock and __________ support.  He was  intubated because of pulmonary edema __________  endotracheal tube.  [name] [name] [name]  [name] [name] was unable to respond and [name] [name] [name] [name] [name]'s family was advised of [name] [name] [name]  need for emergent coronary artery bypass graft surgery with possible  salvage.  Dr. Dorris [name] had discussed survival in less than 50%.  [name] [name]  was in agreement to proceed with emergent coronary artery bypass graft  surgery.   HOSPITAL COURSE:  [name] [name] [name] was admitted to [facility] on December 06, 2005, taken to [name] [name] [name] OR for  emergent coronary artery bypass graft surgery.  Postoperatively he was critically ill and transferred to [name] [name] [name] surgical  intensive care unit.  [name] [name] [name] [name] [name] had a prolonged postoperative course  including a stay in [name] [name] [name] intensive care unit through December 21, 2005.  During  this time [name] [name] [name] intra-aortic balloon pump was utilized through much of this  time.  He also required several IV drips and vasopressors including Neo-  Synephrine, Levophed, and epinephrine.  Critical care medicine was asked to  assist with critical care management.  He was started on ARDS protocol.  He  was treated with IV Lasix for his pulmonary edema.  He also required  cortisol for postoperative relative adrenal insufficiency.  He was treated  with IV sedation for postoperative agitation and delirium and also required  transfusions for coagulopathy related to thrombocytopenia.  He required  several blood transfusions as well for acute blood loss anemia.Marland Kitchen  He  remained sedated on [name] [name] [name] ventilator for several days requiring TNA and then  Jevity tube feedings via Panda tube.  He was ultimately weaned from  vasopressors and was felt appropriate for ventilator weaning on December 17, 2005.  Of note, prior to extubation he did have some loose stools which were  felt probably most likely related to his tube feeds, although his stool was  sent for C. difficile and were negative.  His diarrhea did improve.  He also  had had fevers as well and subsequently blood cultures were drawn with [name] [name] [name]  one culture showing coagulase-negative Staphylococcus and was treated with  vancomycin and Maxipime, although there was some question whether this was a  contaminated specimen.  He did receive follow-up blood cultures which were  negative.  In regards to his postoperative volume excess, again, he was on  IV Lasix with overall good response, although he did have resulting mild azotemia.  This improved with an adjustment in his diuretic therapy which   was eventually discontinued.  During his postoperative course he also had  episode of rapid atrial fibrillation.  Required IV Amiodarone which was  later converted to p.o. amiodarone.  He was also started on digoxin.  Coreg  therapy was also added as his blood pressure tolerated.  Once extubated both  a speech therapy and physical therapy was requested.  A bedside swallow  study was evaluated and he passed and was advanced to soft diet and then  regular and then later to a heart healthy diet.  Physical therapy did see  him a few times, but felt he met goals and did not feel that he would need  home health physical therapy.  Cardiac rehabilitation also  saw [name] [name] [name] [name] [name]  postoperatively and felt he was walking with a steady gait.  He was able to  walk without assistance or assistive devices.   On December 21, 2005 Mr. Scallon was felt appropriate for transfer out of [name] [name] [name]  surgical intensive care unit onto telemetry unit 2000.  By this time he was  afebrile.  Blood pressure was stable at 100/55 off of pressors.  He was  maintaining sinus rhythm in [name] [name] [name] 90s and saturating 96% on 3 L per nasal  cannula.  His white blood count was trending down now at 13,000 and  hemoglobin/hematocrit were stable at 10 and 31, respectively.  Renal  function also remained stable with BUN of 34 and creatinine 1.4.  Blood  sugar was now also stable at 106 as he did require Lantus insulin previously  while in [name] [name] [name] ICU which has since been discontinued for hypoglycemia.  His  latest chest x-ray showed improving aeration overall without pneumothorax.  His chest tubes and invasive lines had been discontinued by this point.  His  volume excess was also better and diuretic therapy was discontinued.  He  remained on Coreg, amiodarone, and digoxin for his previous atrial  fibrillation.  Once on unit 2000 Mr. Passey made steady progress.  He was  ultimately weaned from supplemental oxygen and saturated above 92%.  At [name] [name] [name]   time of dictation ACE inhibitor therapy has been added to his regimen with  systolic blood pressure holding between 90 and 110, a diastolic in [name] [name] [name] 50s  to 70s.  Will monitor his blood pressure for [name] [name] [name] next one to two days and  make adjustments with his medication regimen if felt appropriate.  While on  2000 physical examination also remained stable showing clear lung sounds,  his heart with a regular rate, and his abdominal examination benign with  both bowel and bladder function appropriately.  His extremities showed no  edema and incisions appeared to be healing well without signs of infection.  His pain was controlled on oral medication as well and he was tolerating a  regular diet.  If he continues to progress in this manner it is anticipated  that he will be stable for discharge home on [name] [name] [name] May 1 or Dec 25, 2005.  Of  note, his mentation has also cleared and he remained neurologically intact.  LABORATORY DATA:  Recent laboratories at this dictation show sodium 140,  potassium 3.6, chloride 107, CO2 27, blood glucose 94, BUN 30 (trending  down), creatinine 1.1, calcium 8.  Digoxin was 0.8.  WBC is 13.3 and also  trending down, hemoglobin 10.8, hematocrit 31.4, platelet count 826.  His  liver function tests on December 20, 2005 showed a total bilirubin of 2.2,  alkaline phosphatase 162, AST of 116, ALT of 200 (his admission LFTs showed  a total bilirubin of 0.7, alkaline phosphatase 114, AST 39, ALT of 43).  Follow-up CMET is pending at [name] [name] [name] time of this dictation.  Magnesium was 2.9.  TSH 5.05.  Random cortisol April 26 is 37.6.   DISCHARGE MEDICATIONS:  1.  Coated aspirin 325 mg p.o. daily.  2.  Coreg 3.125 mg p.o. b.i.d.  3.  Altace 2.5 mg p.o. daily.  4.  Crestor 20 mg p.o. daily.  5.  Amiodarone 200 mg p.o. b.i.d.  6.  Digoxin 0.125 mg daily.  7.  Remeron 15 mg one-half tablet p.o. q.h.s.  8.  Clonazepam 1 mg p.o. q.h.s.  9.  Thiamine 100 mg p.o. daily.  10. Fish oil capsules 1000  mg p.o. daily.  11. Ultram 50 mg one to two tablets p.o. q.6h. p.r.n. pain.   DISCHARGE INSTRUCTIONS:  He is instructed to avoid driving or heavy lifting  more than 10 pounds.  He is encouraged to continue daily walking and  breathing exercises.  He is to follow a low-fat, low-salt diet.  He may  shower and clean his incisions gently with soap and water and should notify  [name] [name] [name] CVTS office if he develops fever greater than 101 or redness or drainage  from his incision sites, increased shortness of breath, or edema.   FOLLOW UP:  He is to follow up to Dr. Charlett [name] at [name] [name] [name] [facility] office  on Jan 21, 2006 at 12 pm or sooner if needed.  He should call and schedule  two week follow up with Dr. Verdis Prime of [facility].  He should have  a chest x-ray as well at this appointment.      Jerold Coombe, P.A.    ______________________________  [name] [name] [name] [name] [name] [name] [name] [name] [name] [name], M.D.    AWZ/MEDQ  D:  12/23/2005  T:  12/24/2005  Job:  [id number]   cc:   Lyn Records, M.D.  Fax: [phone]   Holley Bouche, M.D.  Fax: [phone]   Baldo Daub, M.D.

## 2011-01-11 NOTE — Op Note (Signed)
NAMEELIN, SEATS             ACCOUNT NO.:  [id number]   MEDICAL RECORD NO.:  [id number]          PATIENT TYPE:  INP   LOCATION:  2310                         FACILITY:  [facility]   PHYSICIAN:  Bedelia Person, M.D.        DATE OF BIRTH:  1961/04/09   DATE OF PROCEDURE:  12/06/2005  DATE OF DISCHARGE:                                 OPERATIVE REPORT   PROCEDURE:  Interoperative transesophageal echocardiography.   Mr. Blucher is a 51 year old who presented to the emergency room  and  subsequently the cardiac catheterization for an acute myocardial infarction.  In the cath lab, he was intubated due to severe shortness of breath.  He was  taken urgently to the operating room with anticipation of coronary artery  bypass grafting.  After placement on the operating table, the  transesophageal probe was heavily lubricated and passed down the oropharynx,  it remained at the 40-45 cm mark throughout the case obtaining various  Omniplane views.  At the completion of the procedure, the probe was removed  and there was no evidence of oral or pharyngeal damage.   The pre-bypass examination with an intra-aortic balloon pump in place and  functioning showed the left ventricle to be normal size and thickness.  There was akinesis of the anterior and anteroseptal wall.  There was  hypokinesis of the posterior and mild hypokinesis of the lateral walls.  The  left atrium was normal size and appearance.  The septum was intact, the  appendage was cleaned.  The mitral valve was normal in appearance, coapting  well within the valvular plane.  There was trace central mitral  regurgitation.  The aortic valve had three leaflets, it was normal in  appearance and function.  There was no regurgitant flow.  The tricuspid  valve did show a moderate amount of tricuspid regurgitation with Swan-Ganz  catheter across the valve.  Otherwise, the right heart was normal.   The patient was placed on cardiopulmonary bypass and  underwent emergent  three vessel revascularization at completion of the bypass.  He had dynamic  contractility of both the posterior septum, posterior and lateral walls, on  inotropic Dopamine and epinephrine.  There continued to be akinesis of the  anterior and anteroseptal walls.  The remainder of the exam showed no other  significant changes from the pre-bypass examination.           ______________________________  Bedelia Person, M.D.     LK/MEDQ  D:  12/07/2005  T:  12/07/2005  Job:  [id number]

## 2011-01-22 ENCOUNTER — Encounter: Payer: Self-pay | Admitting: Internal Medicine

## 2011-02-14 ENCOUNTER — Encounter: Payer: Self-pay | Admitting: Internal Medicine

## 2011-04-17 ENCOUNTER — Encounter: Payer: Self-pay | Admitting: Internal Medicine

## 2011-05-24 LAB — BASIC METABOLIC PANEL
CO2: 25
Calcium: 9.4
Chloride: 108
Creatinine, Ser: 1.14
GFR calc Af Amer: >60
GFR calc non Af Amer: >60

## 2011-05-24 LAB — CBC
Hemoglobin: 15.1
Platelets: 185
RBC: 4.53
WBC: 5.7

## 2011-05-24 LAB — APTT: aPTT: 33

## 2011-06-11 ENCOUNTER — Encounter: Payer: Self-pay | Admitting: Internal Medicine

## 2011-10-01 ENCOUNTER — Encounter: Payer: Self-pay | Admitting: Internal Medicine

## 2011-10-01 ENCOUNTER — Ambulatory Visit (INDEPENDENT_AMBULATORY_CARE_PROVIDER_SITE_OTHER): Payer: 59 | Admitting: Internal Medicine

## 2011-10-01 DIAGNOSIS — I5022 Chronic systolic (congestive) heart failure: Secondary | ICD-10-CM | POA: Insufficient documentation

## 2011-10-01 DIAGNOSIS — I255 Ischemic cardiomyopathy: Secondary | ICD-10-CM

## 2011-10-01 DIAGNOSIS — I2589 Other forms of chronic ischemic heart disease: Secondary | ICD-10-CM

## 2011-10-01 DIAGNOSIS — I428 Other cardiomyopathies: Secondary | ICD-10-CM

## 2011-10-01 DIAGNOSIS — Z95810 Presence of automatic (implantable) cardiac defibrillator: Secondary | ICD-10-CM

## 2011-10-01 LAB — ICD DEVICE OBSERVATION
BATTERY VOLTAGE: 3.06 V
BRDY-0002RV: 40 {beats}/min
PACEART VT: 0
RV LEAD AMPLITUDE: 19.8904 mv
RV LEAD THRESHOLD: 0.5 V
TOT-0001: 3
TOT-0006: 20070921000000
TZAT-0001SLOWVT: 2
TZAT-0002SLOWVT: NEGATIVE
TZAT-0002SLOWVT: NEGATIVE
TZAT-0012SLOWVT: 200 ms
TZAT-0012SLOWVT: 200 ms
TZAT-0019FASTVT: 8 V
TZAT-0019SLOWVT: 8 V
TZAT-0020FASTVT: 1.5 ms
TZON-0004SLOWVT: 16
TZON-0004VSLOWVT: 32
TZST-0001FASTVT: 2
TZST-0001FASTVT: 4
TZST-0001FASTVT: 5
TZST-0001FASTVT: 6
TZST-0001SLOWVT: 6
TZST-0002FASTVT: NEGATIVE
TZST-0002FASTVT: NEGATIVE
TZST-0002FASTVT: NEGATIVE
TZST-0002FASTVT: NEGATIVE
TZST-0002SLOWVT: NEGATIVE
VENTRICULAR PACING ICD: 0.01 pct

## 2011-10-01 NOTE — Assessment & Plan Note (Signed)
His device is working normally. We'll plan to recheck in several months. 

## 2011-10-01 NOTE — Assessment & Plan Note (Signed)
His symptoms are well-controlled. He is class II. He will continue his current medical therapy. He will maintain a low-sodium diet.

## 2011-10-01 NOTE — Assessment & Plan Note (Signed)
He denies anginal symptoms. He will continue his current medical therapy. 

## 2011-10-01 NOTE — Progress Notes (Signed)
HPI [name] [name] returns today for followup. He is a very pleasant 51 year old man with an ischemic cardiomyopathy. He is status post ICD implantation. Several years ago he developed a fractured Medtronic ICD lead and underwent revision. The patient denies chest pain or shortness of breath. He mainly complains of arthritis in his lower back. He has had no ICD shocks. No peripheral edema. Allergies  Allergen Reactions  . Adhesive (Tape)   . Eggs Or Egg-Derived Products   . Penicillins      Current Outpatient Prescriptions  Medication Sig Dispense Refill  . aspirin 325 MG EC tablet Take 325 mg by mouth daily.      . carvedilol (COREG) 12.5 MG tablet Take 12.5 mg by mouth 2 (two) times daily with a meal.      . clopidogrel (PLAVIX) 75 MG tablet Take 75 mg by mouth daily.      . diphenoxylate-atropine (LOMOTIL) 2.5-0.025 MG per tablet Take 1 tablet by mouth 4 (four) times daily as needed.      . fish oil-omega-3 fatty acids 1000 MG capsule Take 2 g by mouth daily.      Marland Kitchen levothyroxine (SYNTHROID, LEVOTHROID) 25 MCG tablet Take 25 mcg by mouth daily.      Marland Kitchen lisinopril (PRINIVIL,ZESTRIL) 20 MG tablet Take 20 mg by mouth daily.      . methocarbamol (ROBAXIN) 750 MG tablet Take 750 mg by mouth as needed.      . mirtazapine (REMERON) 45 MG tablet Take 45 mg by mouth at bedtime.      . nitroGLYCERIN (NITROSTAT) 0.4 MG SL tablet Place 0.4 mg under the tongue every 5 (five) minutes as needed.      . rosuvastatin (CRESTOR) 20 MG tablet Take 20 mg by mouth daily.         Past Medical History  Diagnosis Date  . CAD (coronary artery disease)   . MI (myocardial infarction) 11/2005    ROS:   All systems reviewed and negative except as noted in the HPI.   Past Surgical History  Procedure Date  . Cardiac catheterization 2004, 2007  . Cardiac defibrillator placement 04/2006    Medtronic [employer]-Dr. Amil Amen     No family history on file.   History   Social History  . Marital Status:  Married    Spouse Name: N/A    Number of Children: N/A  . Years of Education: N/A   Occupational History  . Not on file.   Social History Main Topics  . Smoking status: Never Smoker   . Smokeless tobacco: Not on file  . Alcohol Use: Not on file  . Drug Use: Not on file  . Sexually Active: Not on file   Other Topics Concern  . Not on file   Social History Narrative  . No narrative on file     BP 114/66  Pulse 65  Ht 6' (1.829 m)  Wt 93.441 kg (206 lb)  BMI 27.94 kg/m2  Physical Exam:  Well appearing middle-aged man, NAD HEENT: Unremarkable Neck:  No JVD, no thyromegally Back:  No CVA tenderness Lungs:  Clear with no wheezes, rales, or rhonchi. Well-healed ICD incision. HEART:  Regular rate rhythm, no murmurs, no rubs, no clicks Abd:  soft, positive bowel sounds, no organomegally, no rebound, no guarding Ext:  2 plus pulses, no edema, no cyanosis, no clubbing Skin:  No rashes no nodules Neuro:  CN II through XII intact, motor grossly intact  DEVICE  Normal device function.  See PaceArt for details.   Assess/Plan:

## 2011-10-01 NOTE — Patient Instructions (Signed)
Your physician wants you to follow-up in: 12 months with Dr Court Joy will receive a reminder letter in the mail two months in advance. If you don't receive a letter, please call our office to schedule the follow-up appointment.   Remote monitoring is used to monitor your Pacemaker of ICD from home. This monitoring reduces the number of office visits required to check your device to one time per year. It allows [place] to keep an eye on the functioning of your device to ensure it is working properly. You are scheduled for a device check from home on 01/02/2012. You may send your transmission at any time that day. If you have a wireless device, the transmission will be sent automatically. After your physician reviews your transmission, you will receive a postcard with your next transmission date.

## 2011-12-03 ENCOUNTER — Encounter: Payer: Self-pay | Admitting: Internal Medicine

## 2012-01-02 ENCOUNTER — Encounter: Payer: 59 | Admitting: *Deleted

## 2012-01-10 ENCOUNTER — Encounter: Payer: Self-pay | Admitting: *Deleted

## 2012-01-16 ENCOUNTER — Ambulatory Visit (INDEPENDENT_AMBULATORY_CARE_PROVIDER_SITE_OTHER): Payer: 59 | Admitting: *Deleted

## 2012-01-16 ENCOUNTER — Encounter: Payer: Self-pay | Admitting: Internal Medicine

## 2012-01-16 ENCOUNTER — Telehealth: Payer: Self-pay | Admitting: Internal Medicine

## 2012-01-16 DIAGNOSIS — I5022 Chronic systolic (congestive) heart failure: Secondary | ICD-10-CM

## 2012-01-16 DIAGNOSIS — Z95810 Presence of automatic (implantable) cardiac defibrillator: Secondary | ICD-10-CM

## 2012-01-16 DIAGNOSIS — I428 Other cardiomyopathies: Secondary | ICD-10-CM

## 2012-01-16 NOTE — Telephone Encounter (Signed)
Instructed pt on how to send transmission manually. Pt to send this afternoon and will call if not received.

## 2012-01-16 NOTE — Telephone Encounter (Signed)
New problem:   Patient calling to speak with Device clinic wants some clarification on home transmitting. Explain to patient to call the 1-800 number on box. Patient would like the nurse to call him to explain.

## 2012-02-06 LAB — REMOTE ICD DEVICE
BATTERY VOLTAGE: 3.03 V
BRDY-0002RV: 40 {beats}/min
DEV-0020ICD: NEGATIVE
TOT-0001: 3
TZAT-0001FASTVT: 1
TZAT-0001SLOWVT: 1
TZAT-0018FASTVT: NEGATIVE
TZAT-0019SLOWVT: 8 V
TZAT-0020SLOWVT: 1.5 ms
TZON-0004SLOWVT: 16
TZON-0005SLOWVT: 12
TZST-0001FASTVT: 3
TZST-0001SLOWVT: 3
TZST-0001SLOWVT: 4
TZST-0001SLOWVT: 5
TZST-0002FASTVT: NEGATIVE
TZST-0002FASTVT: NEGATIVE
TZST-0002SLOWVT: NEGATIVE

## 2012-02-18 ENCOUNTER — Encounter: Payer: Self-pay | Admitting: *Deleted

## 2012-05-11 ENCOUNTER — Encounter: Payer: Self-pay | Admitting: Internal Medicine

## 2012-05-11 ENCOUNTER — Ambulatory Visit (INDEPENDENT_AMBULATORY_CARE_PROVIDER_SITE_OTHER): Payer: 59 | Admitting: *Deleted

## 2012-05-11 DIAGNOSIS — I5022 Chronic systolic (congestive) heart failure: Secondary | ICD-10-CM

## 2012-05-11 DIAGNOSIS — I428 Other cardiomyopathies: Secondary | ICD-10-CM

## 2012-05-12 LAB — REMOTE ICD DEVICE
CHARGE TIME: 9.859 s
FVT: 0
TOT-0002: 0
TOT-0006: 20070921000000
TZAT-0001FASTVT: 1
TZAT-0002SLOWVT: NEGATIVE
TZAT-0002SLOWVT: NEGATIVE
TZAT-0018SLOWVT: NEGATIVE
TZAT-0018SLOWVT: NEGATIVE
TZAT-0019SLOWVT: 8 V
TZAT-0020FASTVT: 1.5 ms
TZAT-0020SLOWVT: 1.5 ms
TZON-0003SLOWVT: 350 ms
TZST-0001FASTVT: 4
TZST-0001SLOWVT: 6
TZST-0002FASTVT: NEGATIVE
TZST-0002FASTVT: NEGATIVE
TZST-0002FASTVT: NEGATIVE
TZST-0002SLOWVT: NEGATIVE
VENTRICULAR PACING ICD: 0.01 pct
VF: 0

## 2012-05-20 ENCOUNTER — Encounter: Payer: Self-pay | Admitting: *Deleted

## 2012-08-17 ENCOUNTER — Ambulatory Visit (INDEPENDENT_AMBULATORY_CARE_PROVIDER_SITE_OTHER): Payer: 59 | Admitting: *Deleted

## 2012-08-17 ENCOUNTER — Encounter: Payer: Self-pay | Admitting: Internal Medicine

## 2012-08-17 DIAGNOSIS — I255 Ischemic cardiomyopathy: Secondary | ICD-10-CM

## 2012-08-17 DIAGNOSIS — I5022 Chronic systolic (congestive) heart failure: Secondary | ICD-10-CM

## 2012-08-17 DIAGNOSIS — Z95810 Presence of automatic (implantable) cardiac defibrillator: Secondary | ICD-10-CM

## 2012-08-17 DIAGNOSIS — I2589 Other forms of chronic ischemic heart disease: Secondary | ICD-10-CM

## 2012-08-24 LAB — REMOTE ICD DEVICE
BATTERY VOLTAGE: 2.99 V
BRDY-0002RV: 40 {beats}/min
FVT: 0
RV LEAD AMPLITUDE: 19.3 mv
RV LEAD IMPEDENCE ICD: 504 Ohm
TOT-0006: 20070921000000
TZAT-0001FASTVT: 1
TZAT-0001SLOWVT: 1
TZAT-0001SLOWVT: 2
TZAT-0002SLOWVT: NEGATIVE
TZAT-0012FASTVT: 200 ms
TZAT-0018SLOWVT: NEGATIVE
TZAT-0019FASTVT: 8 V
TZON-0003SLOWVT: 350 ms
TZON-0003VSLOWVT: 350 ms
TZON-0004SLOWVT: 16
TZON-0004VSLOWVT: 32
TZON-0005SLOWVT: 12
TZST-0001FASTVT: 3
TZST-0001FASTVT: 6
TZST-0002FASTVT: NEGATIVE
TZST-0002FASTVT: NEGATIVE
TZST-0002FASTVT: NEGATIVE
TZST-0002SLOWVT: NEGATIVE
VF: 0

## 2012-08-28 ENCOUNTER — Encounter: Payer: Self-pay | Admitting: *Deleted

## 2012-09-30 ENCOUNTER — Encounter: Payer: Self-pay | Admitting: Internal Medicine

## 2012-09-30 ENCOUNTER — Ambulatory Visit (INDEPENDENT_AMBULATORY_CARE_PROVIDER_SITE_OTHER): Payer: 59 | Admitting: Internal Medicine

## 2012-09-30 VITALS — BP 120/80 | HR 57 | Ht 72.0 in | Wt 207.4 lb

## 2012-09-30 DIAGNOSIS — I2589 Other forms of chronic ischemic heart disease: Secondary | ICD-10-CM

## 2012-09-30 DIAGNOSIS — Z95810 Presence of automatic (implantable) cardiac defibrillator: Secondary | ICD-10-CM

## 2012-09-30 DIAGNOSIS — I5022 Chronic systolic (congestive) heart failure: Secondary | ICD-10-CM

## 2012-09-30 DIAGNOSIS — I255 Ischemic cardiomyopathy: Secondary | ICD-10-CM

## 2012-09-30 LAB — ICD DEVICE OBSERVATION
BATTERY VOLTAGE: 2.97 V
BRDY-0002RV: 40 {beats}/min
FVT: 0
PACEART VT: 0
TZAT-0002SLOWVT: NEGATIVE
TZAT-0012FASTVT: 200 ms
TZAT-0012SLOWVT: 200 ms
TZAT-0018SLOWVT: NEGATIVE
TZAT-0019SLOWVT: 8 V
TZAT-0019SLOWVT: 8 V
TZAT-0020FASTVT: 1.5 ms
TZAT-0020SLOWVT: 1.5 ms
TZON-0003VSLOWVT: 350 ms
TZON-0004VSLOWVT: 32
TZST-0001FASTVT: 5
TZST-0001SLOWVT: 6
TZST-0002FASTVT: NEGATIVE
TZST-0002FASTVT: NEGATIVE
TZST-0002SLOWVT: NEGATIVE
VENTRICULAR PACING ICD: 0.03 pct
VF: 0

## 2012-09-30 NOTE — Progress Notes (Signed)
HPI [name] [name] returns today for followup. He is a very pleasant 52 year old man with an ischemic cardiomyopathy, status post myocardial infarction, chronic class I congestive heart failure, status post ICD implantation. In the interim, he has done well. He denies chest pain, shortness of breath, or peripheral edema. No syncope or ICD shock. Allergies  Allergen Reactions  . Adhesive (Tape)   . Eggs Or Egg-Derived Products   . Penicillins      Current Outpatient Prescriptions  Medication Sig Dispense Refill  . aspirin 325 MG EC tablet Take 325 mg by mouth daily.      . carvedilol (COREG) 12.5 MG tablet Take 12.5 mg by mouth 2 (two) times daily with a meal.      . clopidogrel (PLAVIX) 75 MG tablet Take 75 mg by mouth daily.      . diphenoxylate-atropine (LOMOTIL) 2.5-0.025 MG per tablet Take 1 tablet by mouth 4 (four) times daily as needed.      . fish oil-omega-3 fatty acids 1000 MG capsule Take 2 g by mouth daily.      Marland Kitchen levothyroxine (SYNTHROID, LEVOTHROID) 25 MCG tablet Take 25 mcg by mouth daily.      Marland Kitchen lisinopril (PRINIVIL,ZESTRIL) 20 MG tablet Take 20 mg by mouth daily.      . methocarbamol (ROBAXIN) 750 MG tablet Take 750 mg by mouth as needed.      . mirtazapine (REMERON) 45 MG tablet Take 45 mg by mouth at bedtime.      . nitroGLYCERIN (NITROSTAT) 0.4 MG SL tablet Place 0.4 mg under the tongue every 5 (five) minutes as needed.      . rosuvastatin (CRESTOR) 20 MG tablet Take 20 mg by mouth daily.         Past Medical History  Diagnosis Date  . CAD (coronary artery disease)   . MI (myocardial infarction) 11/2005    ROS:   All systems reviewed and negative except as noted in the HPI.   Past Surgical History  Procedure Date  . Cardiac catheterization 2004, 2007  . Cardiac defibrillator placement 04/2006    [employer]tronic [employer]-Dr. Amil Amen     No family history on file.   History   Social History  . Marital Status: Married    Spouse Name: N/A    Number of  Children: N/A  . Years of Education: N/A   Occupational History  . Not on file.   Social History Main Topics  . Smoking status: Never Smoker   . Smokeless tobacco: Not on file  . Alcohol Use: Not on file  . Drug Use: Not on file  . Sexually Active: Not on file   Other Topics Concern  . Not on file   Social History Narrative  . No narrative on file     BP 120/80  Pulse 57  Ht 6' (1.829 m)  Wt 207 lb 6.4 oz (94.076 kg)  BMI 28.13 kg/m2  Physical Exam:  Well appearingmiddle-aged man, NAD HEENT: Unremarkable Neck:  No JVD, no thyromegally Lungs:  Clear with no wheezes, rales, or rhonchi. HEART:  Regular rate rhythm, no murmurs, no rubs, no clicks Abd:  soft, positive bowel sounds, no organomegally, no rebound, no guarding Ext:  2 plus pulses, no edema, no cyanosis, no clubbing Skin:  No rashes no nodules Neuro:  CN II through XII intact, motor grossly intact  DEVICE  Normal device function.  See PaceArt for details.   Assess/Plan:

## 2012-09-30 NOTE — Assessment & Plan Note (Signed)
Despite severe left ventricular systolic function, he has no anginal symptoms. I've encouraged the patient to increase his physical activity. He'll continue his current medical therapy.

## 2012-09-30 NOTE — Assessment & Plan Note (Signed)
His chronic systolic heart failure is class I. He will continue his current medications and maintain a low-sodium diet.

## 2012-09-30 NOTE — Assessment & Plan Note (Signed)
His Medtronic single-chamber ICD is working normally. We'll plan to recheck in several months. 

## 2012-10-14 ENCOUNTER — Telehealth: Payer: Self-pay | Admitting: Internal Medicine

## 2012-10-14 NOTE — Telephone Encounter (Signed)
Per pt call he has a medtronic device and his care link monitor has went bad and he needs another one ordered

## 2012-10-14 NOTE — Telephone Encounter (Signed)
New transmitter and return box ordered for pt. Spoke w/pt and pt aware of this.

## 2012-12-02 ENCOUNTER — Encounter: Payer: Self-pay | Admitting: Internal Medicine

## 2012-12-28 ENCOUNTER — Other Ambulatory Visit: Payer: Self-pay | Admitting: Internal Medicine

## 2012-12-28 ENCOUNTER — Ambulatory Visit (INDEPENDENT_AMBULATORY_CARE_PROVIDER_SITE_OTHER): Payer: 59 | Admitting: *Deleted

## 2012-12-28 DIAGNOSIS — Z95810 Presence of automatic (implantable) cardiac defibrillator: Secondary | ICD-10-CM

## 2012-12-28 DIAGNOSIS — I2589 Other forms of chronic ischemic heart disease: Secondary | ICD-10-CM

## 2012-12-28 DIAGNOSIS — I255 Ischemic cardiomyopathy: Secondary | ICD-10-CM

## 2013-01-03 LAB — REMOTE ICD DEVICE
BATTERY VOLTAGE: 2.95 V
BRDY-0002RV: 40 {beats}/min
CHARGE TIME: 10.37 s
RV LEAD AMPLITUDE: 19.3 mv
TOT-0002: 0
TOT-0006: 20070921000000
TZAT-0001FASTVT: 1
TZAT-0001SLOWVT: 2
TZAT-0002SLOWVT: NEGATIVE
TZAT-0012FASTVT: 200 ms
TZAT-0012SLOWVT: 200 ms
TZAT-0019SLOWVT: 8 V
TZAT-0020SLOWVT: 1.5 ms
TZON-0003SLOWVT: 350 ms
TZON-0004SLOWVT: 32
TZON-0004VSLOWVT: 32
TZON-0005SLOWVT: 12
TZST-0001FASTVT: 2
TZST-0001FASTVT: 3
TZST-0001FASTVT: 4
TZST-0001SLOWVT: 4
TZST-0001SLOWVT: 5
TZST-0001SLOWVT: 6
TZST-0002FASTVT: NEGATIVE
TZST-0002FASTVT: NEGATIVE
TZST-0002SLOWVT: NEGATIVE
TZST-0002SLOWVT: NEGATIVE
TZST-0002SLOWVT: NEGATIVE

## 2013-01-13 ENCOUNTER — Encounter: Payer: Self-pay | Admitting: *Deleted

## 2013-01-27 ENCOUNTER — Encounter: Payer: Self-pay | Admitting: Internal Medicine

## 2013-04-05 ENCOUNTER — Encounter: Payer: Self-pay | Admitting: Internal Medicine

## 2013-04-05 ENCOUNTER — Ambulatory Visit (INDEPENDENT_AMBULATORY_CARE_PROVIDER_SITE_OTHER): Payer: 59 | Admitting: *Deleted

## 2013-04-05 DIAGNOSIS — Z95810 Presence of automatic (implantable) cardiac defibrillator: Secondary | ICD-10-CM

## 2013-04-05 DIAGNOSIS — I255 Ischemic cardiomyopathy: Secondary | ICD-10-CM

## 2013-04-05 DIAGNOSIS — I2589 Other forms of chronic ischemic heart disease: Secondary | ICD-10-CM

## 2013-04-12 LAB — REMOTE ICD DEVICE
BATTERY VOLTAGE: 2.92 V
CHARGE TIME: 10.37 s
DEV-0020ICD: NEGATIVE
PACEART VT: 0
RV LEAD AMPLITUDE: 19.9 mv
TOT-0001: 3
TOT-0006: 20070921000000
TZAT-0001SLOWVT: 2
TZAT-0012FASTVT: 200 ms
TZAT-0012SLOWVT: 200 ms
TZAT-0012SLOWVT: 200 ms
TZAT-0018FASTVT: NEGATIVE
TZAT-0018SLOWVT: NEGATIVE
TZAT-0019SLOWVT: 8 V
TZAT-0020FASTVT: 1.5 ms
TZAT-0020SLOWVT: 1.5 ms
TZAT-0020SLOWVT: 1.5 ms
TZON-0003SLOWVT: 350 ms
TZON-0004SLOWVT: 32
TZON-0004VSLOWVT: 32
TZST-0001FASTVT: 2
TZST-0001FASTVT: 6
TZST-0001SLOWVT: 3
TZST-0001SLOWVT: 4
TZST-0002FASTVT: NEGATIVE
TZST-0002FASTVT: NEGATIVE
TZST-0002SLOWVT: NEGATIVE
TZST-0002SLOWVT: NEGATIVE
TZST-0002SLOWVT: NEGATIVE
VENTRICULAR PACING ICD: 0.01 pct

## 2013-04-16 ENCOUNTER — Encounter: Payer: Self-pay | Admitting: *Deleted

## 2013-07-12 ENCOUNTER — Encounter: Payer: Self-pay | Admitting: Internal Medicine

## 2013-07-12 ENCOUNTER — Ambulatory Visit (INDEPENDENT_AMBULATORY_CARE_PROVIDER_SITE_OTHER): Payer: 59 | Admitting: *Deleted

## 2013-07-12 DIAGNOSIS — I428 Other cardiomyopathies: Secondary | ICD-10-CM

## 2013-07-12 DIAGNOSIS — Z95810 Presence of automatic (implantable) cardiac defibrillator: Secondary | ICD-10-CM

## 2013-07-12 LAB — MDC_IDC_ENUM_SESS_TYPE_REMOTE
Brady Statistic RV Percent Paced: 0.01 %
HighPow Impedance: 50 Ohm
HighPow Impedance: 61 Ohm
Lead Channel Impedance Value: 528 Ohm
Lead Channel Setting Pacing Pulse Width: 0.4 ms
Zone Setting Detection Interval: 300 ms
Zone Setting Detection Interval: 350 ms

## 2013-07-27 ENCOUNTER — Encounter: Payer: Self-pay | Admitting: *Deleted

## 2013-09-25 ENCOUNTER — Encounter: Payer: Self-pay | Admitting: *Deleted

## 2013-09-25 DIAGNOSIS — I2510 Atherosclerotic heart disease of native coronary artery without angina pectoris: Secondary | ICD-10-CM

## 2013-09-25 DIAGNOSIS — I25810 Atherosclerosis of coronary artery bypass graft(s) without angina pectoris: Secondary | ICD-10-CM | POA: Insufficient documentation

## 2013-09-25 DIAGNOSIS — E785 Hyperlipidemia, unspecified: Secondary | ICD-10-CM | POA: Insufficient documentation

## 2013-10-22 ENCOUNTER — Other Ambulatory Visit: Payer: Self-pay | Admitting: Interventional Cardiology

## 2013-10-25 ENCOUNTER — Encounter: Payer: Self-pay | Admitting: Cardiology

## 2013-10-28 ENCOUNTER — Ambulatory Visit: Payer: 59 | Admitting: Interventional Cardiology

## 2013-11-04 ENCOUNTER — Encounter: Payer: Self-pay | Admitting: Cardiology

## 2013-11-06 ENCOUNTER — Other Ambulatory Visit: Payer: Self-pay | Admitting: Interventional Cardiology

## 2013-11-09 ENCOUNTER — Ambulatory Visit: Payer: 59 | Admitting: Interventional Cardiology

## 2013-11-09 ENCOUNTER — Encounter: Payer: 59 | Admitting: Cardiology

## 2013-12-03 ENCOUNTER — Encounter: Payer: Self-pay | Admitting: Cardiology

## 2013-12-09 ENCOUNTER — Other Ambulatory Visit: Payer: Self-pay | Admitting: Interventional Cardiology

## 2013-12-10 ENCOUNTER — Telehealth: Payer: Self-pay | Admitting: Internal Medicine

## 2013-12-10 NOTE — Telephone Encounter (Deleted)
ERROR

## 2013-12-21 ENCOUNTER — Ambulatory Visit: Payer: 59 | Admitting: Interventional Cardiology

## 2013-12-21 ENCOUNTER — Encounter: Payer: 59 | Admitting: Cardiology

## 2013-12-24 NOTE — Telephone Encounter (Signed)
Please close encounter, Thanks! SR °

## 2014-01-25 ENCOUNTER — Encounter: Payer: Self-pay | Admitting: Interventional Cardiology

## 2014-02-07 ENCOUNTER — Other Ambulatory Visit: Payer: Self-pay | Admitting: Interventional Cardiology

## 2014-02-10 ENCOUNTER — Ambulatory Visit: Payer: 59 | Admitting: Interventional Cardiology

## 2014-02-10 ENCOUNTER — Encounter: Payer: 59 | Admitting: Cardiology

## 2014-02-14 ENCOUNTER — Other Ambulatory Visit: Payer: Self-pay | Admitting: Interventional Cardiology

## 2014-03-02 ENCOUNTER — Ambulatory Visit (INDEPENDENT_AMBULATORY_CARE_PROVIDER_SITE_OTHER): Payer: 59 | Admitting: Interventional Cardiology

## 2014-03-02 ENCOUNTER — Encounter: Payer: Self-pay | Admitting: Interventional Cardiology

## 2014-03-02 ENCOUNTER — Encounter: Payer: Self-pay | Admitting: Internal Medicine

## 2014-03-02 ENCOUNTER — Ambulatory Visit (INDEPENDENT_AMBULATORY_CARE_PROVIDER_SITE_OTHER): Payer: 59 | Admitting: Internal Medicine

## 2014-03-02 VITALS — BP 112/68 | HR 58 | Ht 72.0 in | Wt 209.0 lb

## 2014-03-02 VITALS — BP 122/68 | HR 52 | Ht 72.0 in | Wt 209.1 lb

## 2014-03-02 DIAGNOSIS — Z95810 Presence of automatic (implantable) cardiac defibrillator: Secondary | ICD-10-CM

## 2014-03-02 DIAGNOSIS — I5022 Chronic systolic (congestive) heart failure: Secondary | ICD-10-CM

## 2014-03-02 DIAGNOSIS — I255 Ischemic cardiomyopathy: Secondary | ICD-10-CM

## 2014-03-02 DIAGNOSIS — I25810 Atherosclerosis of coronary artery bypass graft(s) without angina pectoris: Secondary | ICD-10-CM

## 2014-03-02 DIAGNOSIS — E785 Hyperlipidemia, unspecified: Secondary | ICD-10-CM

## 2014-03-02 DIAGNOSIS — I2589 Other forms of chronic ischemic heart disease: Secondary | ICD-10-CM

## 2014-03-02 LAB — MDC_IDC_ENUM_SESS_TYPE_INCLINIC
Battery Voltage: 2.71 V
Brady Statistic RV Percent Paced: 0.01 %
Date Time Interrogation Session: 20150708101700
HIGH POWER IMPEDANCE MEASURED VALUE: 49 Ohm
HighPow Impedance: 60 Ohm
Lead Channel Pacing Threshold Amplitude: 0.5 V
Lead Channel Sensing Intrinsic Amplitude: 19.8904
Lead Channel Setting Pacing Pulse Width: 0.4 ms
Lead Channel Setting Sensing Sensitivity: 0.3 mV
MDC IDC MSMT LEADCHNL RV IMPEDANCE VALUE: 520 Ohm
MDC IDC MSMT LEADCHNL RV PACING THRESHOLD PULSEWIDTH: 0.4 ms
MDC IDC SET LEADCHNL RV PACING AMPLITUDE: 2.5 V
Zone Setting Detection Interval: 300 ms
Zone Setting Detection Interval: 350 ms
Zone Setting Detection Interval: 350 ms

## 2014-03-02 NOTE — Patient Instructions (Signed)
Your physician recommends that you continue on your current medications as directed. Please refer to the Current Medication list given to you today.  Please have Dr.Harris's office forward us a copy of your labs. Our fax # [phone]  Your physician wants you to follow-up in: 1 year You will receive a reminder letter in the mail two months in advance. If you don't receive a letter, please call our office to schedule the follow-up appointment.

## 2014-03-02 NOTE — Assessment & Plan Note (Signed)
His ICD is working normally. He is approximately 1 year out from ICD generator change. Will recheck his device in several months.

## 2014-03-02 NOTE — Progress Notes (Signed)
Patient ID: [name] [name], male   DOB: 09/28/1960, 53 y.o.   MRN: [id number]    [address] [place], [place]  [address] Phone: [phone] Fax:  [phone]  Date:  03/02/2014   ID:  [name] [name], DOB 06/06/1961, MRN [id number]  PCP:  No primary provider on file.   ASSESSMENT:  1. Ischemic cardiomyopathy with stable systolic heart failure 2. AICD 3. Coronary artery disease without angina 4. Hyperlipidemia  PLAN:  1. Patient needs to have a blood work done and states that his primary physician we'll be doing this within the next 2 weeks. 2. He needs to have repeat LV assessment done but he has insurance deductible concerns. We will wait until the patient has had his AICD battery change out and do an echocardiogram at that time since that will put him over his deductible. 3. Plan see him back in one year   SUBJECTIVE: [name] [name] is a 53 y.o. male who has no complaints. He has known decrease LV systolic function and coronary disease. He had ostial LAD occlusion with a large anterior myocardial infarction approximately 8 years ago. He underwent salvage angioplasty and emergency coronary bypass grafting. Residual LV function has been less than 35%. He has no significant cardiac complaints. He has an AICD has never had a shock. He denies orthopnea, PND, palpitations, syncope, edema, and other cardiac complaints.   Wt Readings from Last 3 Encounters:  03/02/14 209 lb 1.9 oz (94.856 kg)  09/30/12 207 lb 6.4 oz (94.076 kg)  10/01/11 206 lb (93.441 kg)     Past Medical History  Diagnosis Date  . CAD (coronary artery disease)   . MI (myocardial infarction) 11/2005  . Hyperlipidemia     Current Outpatient Prescriptions  Medication Sig Dispense Refill  . aspirin 325 MG EC tablet Take 325 mg by mouth daily.      . carvedilol (COREG) 12.5 MG tablet TAKE ONE TABLET BY MOUTH TWICE DAILY   180 tablet  0  . clopidogrel (PLAVIX) 75 MG tablet TAKE ONE TABLET BY  MOUTH ONE TIME DAILY   30 tablet  0  . diphenoxylate-atropine (LOMOTIL) 2.5-0.025 MG per tablet Take 1 tablet by mouth 4 (four) times daily as needed.      . fish oil-omega-3 fatty acids 1000 MG capsule Take 2 g by mouth daily.      Marland Kitchen. levothyroxine (SYNTHROID, LEVOTHROID) 25 MCG tablet Take 25 mcg by mouth daily.      Marland Kitchen. lisinopril (PRINIVIL,ZESTRIL) 20 MG tablet TAKE ONE TABLET BY MOUTH DAILY  90 tablet  3  . Melatonin-Pyridoxine (MELATIN PO) Take 5 mg by mouth at bedtime.      . methocarbamol (ROBAXIN) 750 MG tablet Take 750 mg by mouth as needed.      . mirtazapine (REMERON) 45 MG tablet Take 45 mg by mouth at bedtime.      . nitroGLYCERIN (NITROSTAT) 0.4 MG SL tablet Place 0.4 mg under the tongue every 5 (five) minutes as needed.      . rosuvastatin (CRESTOR) 20 MG tablet Take 20 mg by mouth daily.       No current facility-administered medications for this visit.    Allergies:    Allergies  Allergen Reactions  . Adhesive [Tape]   . Eggs Or Egg-Derived Products   . Penicillins     Social History:  The patient  reports that he has never smoked. He does not have any smokeless tobacco history on  file.   ROS:  Please see the history of present illness.   Appetite is been stable. He denies wheezing. No transient neurological complaints. No peripheral edema. No claudication. No blood in his urine or stool.   All other systems reviewed and negative.   OBJECTIVE: VS:  BP 122/68  Pulse 52  Ht 6' (1.829 m)  Wt 209 lb 1.9 oz (94.856 kg)  BMI 28.36 kg/m2 Well nourished, well developed, in no acute distress, younger than stated age HEENT: normal Neck: JVD flat. Carotid bruit absent  Cardiac:  normal S1, S2; RRR; no murmur Lungs:  clear to auscultation bilaterally, no wheezing, rhonchi or rales Abd: soft, nontender, no hepatomegaly Ext: Edema  Absent . Pulses 2+ and symmetric in the upper lower extremities  Skin: warm and dry Neuro:  CNs 2-12 intact, no focal abnormalities noted  EKG:   Normal sinus rhythm with left anterior hemiblock, LVH, QS pattern V1 through V2.       Signed, Darci NeedleHenry W. B. Smith III, MD 03/02/2014 9:24 AM

## 2014-03-02 NOTE — Patient Instructions (Signed)
Your physician recommends that you continue on your current medications as directed. Please refer to the Current Medication list given to you today.  Remote monitoring is used to monitor your Pacemaker of ICD from home. This monitoring reduces the number of office visits required to check your device to one time per year. It allows us to keep an eye on the functioning of your device to ensure it is working properly. You are scheduled for a device check from home on 06/06/14. You may send your transmission at any time that day. If you have a wireless device, the transmission will be sent automatically. After your physician reviews your transmission, you will receive a postcard with your next transmission date.  Your physician wants you to follow-up in: 1 year with Dr. Taylor.  You will receive a reminder letter in the mail two months in advance. If you don't receive a letter, please call our office to schedule the follow-up appointment.  

## 2014-03-02 NOTE — Progress Notes (Signed)
HPI Mr. Lyn Hollingsheadlexander returns today for followup. He is a very pleasant 53 year old man with an ischemic cardiomyopathy, status post myocardial infarction, chronic class I congestive heart failure, status post ICD implantation. In the interim, he has done well. He denies chest pain, shortness of breath, or peripheral edema. No syncope or ICD shock.  Allergies  Allergen Reactions  . Adhesive [Tape]   . Eggs Or Egg-Derived Products   . Penicillins      Current Outpatient Prescriptions  Medication Sig Dispense Refill  . aspirin 325 MG EC tablet Take 325 mg by mouth daily.      . carvedilol (COREG) 12.5 MG tablet TAKE ONE TABLET BY MOUTH TWICE DAILY   180 tablet  0  . clopidogrel (PLAVIX) 75 MG tablet TAKE ONE TABLET BY MOUTH ONE TIME DAILY   30 tablet  0  . diphenoxylate-atropine (LOMOTIL) 2.5-0.025 MG per tablet Take 1 tablet by mouth 4 (four) times daily as needed.      . fish oil-omega-3 fatty acids 1000 MG capsule Take 2 g by mouth daily.      Marland Kitchen. levothyroxine (SYNTHROID, LEVOTHROID) 25 MCG tablet Take 25 mcg by mouth daily.      Marland Kitchen. lisinopril (PRINIVIL,ZESTRIL) 20 MG tablet TAKE ONE TABLET BY MOUTH DAILY  90 tablet  3  . Melatonin-Pyridoxine (MELATIN PO) Take 5 mg by mouth at bedtime.      . methocarbamol (ROBAXIN) 750 MG tablet Take 750 mg by mouth as needed.      . mirtazapine (REMERON) 45 MG tablet Take 45 mg by mouth at bedtime.      . nitroGLYCERIN (NITROSTAT) 0.4 MG SL tablet Place 0.4 mg under the tongue every 5 (five) minutes as needed.      . rosuvastatin (CRESTOR) 20 MG tablet Take 20 mg by mouth daily.       No current facility-administered medications for this visit.     Past Medical History  Diagnosis Date  . CAD (coronary artery disease)   . MI (myocardial infarction) 11/2005  . Hyperlipidemia     ROS:   All systems reviewed and negative except as noted in the HPI.   Past Surgical History  Procedure Laterality Date  . Cardiac catheterization  2004, 2007  . Cardiac  defibrillator placement  04/2006    [employer] [facility]-Dr. Amil AmenEdmunds  . Vasectomy    . Cholecystectomy    . Arterial bypass surgry    . Carotid stent insertion       No family history on file.   History   Social History  . Marital Status: Married    Spouse Name: N/A    Number of Children: N/A  . Years of Education: N/A   Occupational History  . Not on file.   Social History Main Topics  . Smoking status: Never Smoker   . Smokeless tobacco: Not on file  . Alcohol Use: Not on file  . Drug Use: Not on file  . Sexual Activity: Not on file   Other Topics Concern  . Not on file   Social History Narrative  . No narrative on file     BP 112/68  Pulse 58  Ht 6' (1.829 m)  Wt 209 lb (94.802 kg)  BMI 28.34 kg/m2  Physical Exam:  Well appearing middle-aged man, NAD HEENT: Unremarkable Neck:  No JVD, no thyromegally Lungs:  Clear with no wheezes, rales, or rhonchi. HEART:  Regular rate rhythm, no murmurs, no rubs, no clicks Abd:  soft, positive bowel  sounds, no organomegally, no rebound, no guarding Ext:  2 plus pulses, no edema, no cyanosis, no clubbing Skin:  No rashes no nodules Neuro:  CN II through XII intact, motor grossly intact  DEVICE  Normal device function.  See PaceArt for details.   Assess/Plan:

## 2014-03-02 NOTE — Assessment & Plan Note (Signed)
He denies anginal symptoms. No change in meds.  

## 2014-03-03 ENCOUNTER — Encounter: Payer: Self-pay | Admitting: Internal Medicine

## 2014-03-15 ENCOUNTER — Other Ambulatory Visit: Payer: Self-pay | Admitting: Interventional Cardiology

## 2014-03-16 ENCOUNTER — Encounter: Payer: Self-pay | Admitting: Interventional Cardiology

## 2014-05-09 ENCOUNTER — Other Ambulatory Visit: Payer: Self-pay | Admitting: *Deleted

## 2014-05-09 MED ORDER — CARVEDILOL 12.5 MG PO TABS: ORAL_TABLET | ORAL | Status: DC

## 2014-06-06 ENCOUNTER — Ambulatory Visit (INDEPENDENT_AMBULATORY_CARE_PROVIDER_SITE_OTHER): Payer: 59 | Admitting: *Deleted

## 2014-06-06 DIAGNOSIS — Z95810 Presence of automatic (implantable) cardiac defibrillator: Secondary | ICD-10-CM

## 2014-06-06 DIAGNOSIS — I255 Ischemic cardiomyopathy: Secondary | ICD-10-CM

## 2014-06-06 LAB — MDC_IDC_ENUM_SESS_TYPE_REMOTE
Brady Statistic RV Percent Paced: 0.01 %
HIGH POWER IMPEDANCE MEASURED VALUE: 47 Ohm
HighPow Impedance: 59 Ohm
Lead Channel Impedance Value: 520 Ohm
Lead Channel Sensing Intrinsic Amplitude: 19.8904
Lead Channel Setting Pacing Pulse Width: 0.4 ms
Lead Channel Setting Sensing Sensitivity: 0.3 mV
MDC IDC MSMT BATTERY VOLTAGE: 2.65 V
MDC IDC SESS DTM: 20151012121352
MDC IDC SET LEADCHNL RV PACING AMPLITUDE: 2.5 V
MDC IDC SET ZONE DETECTION INTERVAL: 300 ms
Zone Setting Detection Interval: 350 ms
Zone Setting Detection Interval: 350 ms

## 2014-06-06 NOTE — Progress Notes (Signed)
Remote ICD transmission.   

## 2014-06-17 ENCOUNTER — Encounter: Payer: Self-pay | Admitting: *Deleted

## 2014-07-01 ENCOUNTER — Encounter: Payer: Self-pay | Admitting: Internal Medicine

## 2014-09-05 ENCOUNTER — Telehealth: Payer: Self-pay | Admitting: Interventional Cardiology

## 2014-09-05 ENCOUNTER — Ambulatory Visit (INDEPENDENT_AMBULATORY_CARE_PROVIDER_SITE_OTHER): Payer: 59 | Admitting: *Deleted

## 2014-09-05 ENCOUNTER — Encounter: Payer: Self-pay | Admitting: Nurse Practitioner

## 2014-09-05 ENCOUNTER — Ambulatory Visit (INDEPENDENT_AMBULATORY_CARE_PROVIDER_SITE_OTHER): Payer: 59 | Admitting: Nurse Practitioner

## 2014-09-05 VITALS — BP 108/70 | HR 63 | Ht 72.0 in | Wt 212.4 lb

## 2014-09-05 DIAGNOSIS — I255 Ischemic cardiomyopathy: Secondary | ICD-10-CM

## 2014-09-05 DIAGNOSIS — Z95810 Presence of automatic (implantable) cardiac defibrillator: Secondary | ICD-10-CM

## 2014-09-05 DIAGNOSIS — I25810 Atherosclerosis of coronary artery bypass graft(s) without angina pectoris: Secondary | ICD-10-CM

## 2014-09-05 DIAGNOSIS — E785 Hyperlipidemia, unspecified: Secondary | ICD-10-CM

## 2014-09-05 DIAGNOSIS — I5022 Chronic systolic (congestive) heart failure: Secondary | ICD-10-CM

## 2014-09-05 DIAGNOSIS — R002 Palpitations: Secondary | ICD-10-CM

## 2014-09-05 LAB — MDC_IDC_ENUM_SESS_TYPE_INCLINIC
Battery Voltage: 2.65 V
HighPow Impedance: 51 Ohm
HighPow Impedance: 62 Ohm
Lead Channel Impedance Value: 552 Ohm
Lead Channel Pacing Threshold Pulse Width: 0.4 ms
Lead Channel Setting Pacing Amplitude: 2.5 V
Lead Channel Setting Pacing Pulse Width: 0.4 ms
Lead Channel Setting Sensing Sensitivity: 0.3 mV
MDC IDC MSMT LEADCHNL RV PACING THRESHOLD AMPLITUDE: 0.5 V
MDC IDC MSMT LEADCHNL RV SENSING INTR AMPL: 19.8904
MDC IDC SESS DTM: 20160111141700
MDC IDC SET ZONE DETECTION INTERVAL: 400 ms
MDC IDC STAT BRADY RV PERCENT PACED: 0.01 %
Zone Setting Detection Interval: 300 ms
Zone Setting Detection Interval: 350 ms

## 2014-09-05 LAB — BASIC METABOLIC PANEL
BUN: 16 mg/dL (ref 6–23)
CO2: 29 mEq/L (ref 19–32)
Calcium: 9.2 mg/dL (ref 8.4–10.5)
Chloride: 105 mEq/L (ref 96–112)
Creatinine, Ser: 1.2 mg/dL (ref 0.4–1.5)
GFR: 64.91 mL/min (ref >60.00)
Glucose, Bld: 75 mg/dL (ref 70–99)
Potassium: 4.1 mEq/L (ref 3.5–5.1)
Sodium: 141 mEq/L (ref 135–145)

## 2014-09-05 LAB — CBC
HCT: 47.7 % (ref 39.0–52.0)
Hemoglobin: 15.9 g/dL (ref 13.0–17.0)
MCHC: 33.4 g/dL (ref 30.0–36.0)
MCV: 96.9 fl (ref 78.0–100.0)
Platelets: 188 10*3/uL (ref 150.0–400.0)
RBC: 4.93 Mil/uL (ref 4.22–5.81)
RDW: 12.4 % (ref 11.5–15.5)
WBC: 6.5 10*3/uL (ref 4.0–10.5)

## 2014-09-05 LAB — TSH: TSH: 1.59 u[IU]/mL (ref 0.35–4.50)

## 2014-09-05 NOTE — Patient Instructions (Addendum)
We will be checking the following labs today BMET, CBC & TSH  We will arrange for an echocardiogram  Continue to limit your caffeine  Try to exercise regularly  If your symptoms persist - call us back - we will place a cardiac event monitor  Stay on your current medicines for now.   Call [facility] office at [phone] if you have any questions, problems or concerns.

## 2014-09-05 NOTE — Progress Notes (Signed)
ICD check in clinic. Normal device function. Thresholds and sensing consistent with previous device measurements. Impedance trends stable over time. No evidence of any ventricular arrhythmias. Histogram distribution appropriate for patient and level of activity. turned VT monitor zone at 150 on due to pt having palpitations. Device programmed at appropriate safety margins. Device programmed to optimize intrinsic conduction. Battery voltage 2.65 V.Pt enrolled in remote follow-up.  Alert tones/vibration demonstrated for patient and pt aware to contact office if heard. Carelink 12-05-14 and ROV in July with GT.

## 2014-09-05 NOTE — Telephone Encounter (Signed)
Returned pt call. He reports that he has been having intermittent palpitations. He has reduced his caffeine intake, he has had little relief. He has not checked his heart rate.he denies chest pian, sob, fatigue, cough, fever. He sts that he was told by Dr.Smith if has this reoccur he should come to the office for a EKG.pt would like to be seen. appt scheduled today with Dawayne PatriciaLori G.,NP for an EKG. He was thankful and verbalized understanding.

## 2014-09-05 NOTE — Progress Notes (Signed)
[name] [name] Date of Birth: [birth year] Medical Record #[id number]  History of Present Illness: [name] [name] is seen back today for a work in visit. Seen for Dr. [name] and Dr. Katrinka Blazing. He is a 54 year old male with an ischemic CM, prior PCI to the LAD in 2004, followed by massive anterior MI associated with cardiogenic shock treated with salvage PCI followed by CABG with SVG to LAD, SVG to ramus intermedius, and OM1, HLD, chronic class I CHF, has single chamber ICD in place since 2007. Other issues as noted below. Last Myoview looks to be from 2009 with large scar noted, no ischemia and EF of 30%.   Last seen here in July by Dr. [name] and Dr. Katrinka Blazing. Cardiac status appeared stable. Discussion of obtaining an echo was entertained but patient was having insurance issues.   Called here this am - "Returned pt call. He reports that he has been having intermittent palpitations. He has reduced his caffeine intake, he has had little relief. He has not checked his heart rate.he denies chest pian, sob, fatigue, cough, fever. He sts that he was told by Dr.Smith if has this reoccur he should come to the office for a EKG.pt would like to be seen. appt scheduled today with Dawayne Patricia.,NP for an EKG. He was thankful and verbalized understanding."  Thus added to the flex schedule for today.  Comes in today. Here alone. He notes that he has basically done well since his last visit here. He has had an occasional "thumpity thump" - but has had this ever since his MI. Over the past week - he has had more. Had a 3 hour episode this past Friday night. He did not feel bad with this. No chest pain. But did have about 45 seconds of midsternal "gall bladder" like pain - this only happened once and he attributed it to a chicken pie that he had eaten. Was not short of breath. Not dizzy or lightheaded. No diaphoresis. Has been compliant with all of his medicines. No excessive caffeine and what little caffeine he had been using,  he has now stopped. He notes that last week was pretty stressful - had a stressful real estate closing. Chronically does not sleep well. He tries to exercise routinely but sometimes not able to achieve due to his work requirements. No cold medicine. No precipitating events that he can identify. He has had no recurrence (other than his normal "thumpity thump") since Friday night.   Current Outpatient Prescriptions  Medication Sig Dispense Refill  . aspirin 325 MG EC tablet Take 325 mg by mouth daily.    . carvedilol (COREG) 12.5 MG tablet TAKE ONE TABLET BY MOUTH TWICE DAILY 180 tablet 1  . clopidogrel (PLAVIX) 75 MG tablet TAKE ONE TABLET BY MOUTH ONE TIME DAILY  30 tablet 6  . diphenoxylate-atropine (LOMOTIL) 2.5-0.025 MG per tablet Take 1 tablet by mouth 4 (four) times daily as needed.    . fish oil-omega-3 fatty acids 1000 MG capsule Take 2 g by mouth daily.    Marland Kitchen levothyroxine (SYNTHROID, LEVOTHROID) 25 MCG tablet Take 25 mcg by mouth daily.    Marland Kitchen lisinopril (PRINIVIL,ZESTRIL) 20 MG tablet TAKE ONE TABLET BY MOUTH DAILY 90 tablet 3  . Melatonin 5 MG TABS Take 5 mg by mouth at bedtime.    . methocarbamol (ROBAXIN) 750 MG tablet Take 750 mg by mouth as needed.    . mirtazapine (REMERON) 45 MG tablet Take 45 mg by mouth at bedtime.    Marland Kitchen  nitroGLYCERIN (NITROSTAT) 0.4 MG SL tablet Place 0.4 mg under the tongue every 5 (five) minutes as needed.    . promethazine (PHENERGAN) 25 MG tablet Take 25 mg by mouth every 6 (six) hours as needed for nausea or vomiting.    . rosuvastatin (CRESTOR) 20 MG tablet Take 20 mg by mouth daily.     No current facility-administered medications for this visit.    Allergies  Allergen Reactions  . Adhesive [Tape]   . Eggs Or Egg-Derived Products   . Penicillins     Past Medical History  Diagnosis Date  . CAD (coronary artery disease)     Anterior MI with emergent PCI followed by emergent CABG x 3 in April of 2007 per Dr. Dorris Fetch with SVG to LAD and SVG to  ramus & OM1  . MI (myocardial infarction) 11/2005  . Hyperlipidemia   . ICD (implantable cardioverter-defibrillator) in place   . Chronic systolic heart failure     Past Surgical History  Procedure Laterality Date  . Cardiac catheterization  2004, 2007  . Cardiac defibrillator placement  04/2006    [employer] [facility]-Dr. Amil Amen  . Vasectomy    . Cholecystectomy    . Coronary artery bypass graft  11/2005  . Carotid stent insertion      History  Smoking status  . Never Smoker   Smokeless tobacco  . Not on file    History  Alcohol Use No    Family History  Problem Relation Age of Onset  . Heart attack Father     Review of Systems: The review of systems is per the HPI.  All other systems were reviewed and are negative.  Physical Exam: BP 108/70 mmHg  Pulse 63  Ht 6' (1.829 m)  Wt 212 lb 6.4 oz (96.344 kg)  BMI 28.80 kg/m2 Patient is very pleasant and in no acute distress. Skin is warm and dry. Color is normal.  HEENT is unremarkable. Normocephalic/atraumatic. PERRL. Sclera are nonicteric. Neck is supple. No masses. No JVD. Lungs are clear. Cardiac exam shows a regular rate and rhythm. Abdomen is soft. Extremities are without edema. Gait and ROM are intact. No gross neurologic deficits noted.  Wt Readings from Last 3 Encounters:  09/05/14 212 lb 6.4 oz (96.344 kg)  03/02/14 209 lb (94.802 kg)  03/02/14 209 lb 1.9 oz (94.856 kg)    LABORATORY DATA/PROCEDURES: EKG with sinus rhythm, bifascicular block. Diffuse ST and T wave changes that are chronic. Reviewed with Dr. Katrinka Blazing.   Lab Results  Component Value Date   WBC 5.7 03/10/2008   HGB 15.1 03/10/2008   HCT 42.7 03/10/2008   PLT 185 03/10/2008   GLUCOSE 104* 03/10/2008   NA 140 03/10/2008   K 3.9 03/10/2008   CL 108 03/10/2008   CREATININE 1.14 03/10/2008   BUN 12 03/10/2008   CO2 25 03/10/2008   INR 1.0 03/10/2008    BNP (last 3 results) No results for input(s): PROBNP in the last 8760  hours.   Assessment / Plan: 1. Palpitations - his device was checked - no episodes noted. It was noted that we could reproduce his symptoms when checking his V lead. He has a single chamber unit device in place - no info from the [facility] - we will set a monitor zone for 150 on his device. May need to consider event monitor if has recurrence of symptoms. Will check baseline labs and update his echo.   2. Known CAD with remote anterior MI - emergent  PCI followed by CABG from 2007 - no recurrent chest pain. EKG unchanged.   3. Chronic systolic HF - stable.  4. ICM - needs his echo updated.   Discussed with Dr. Katrinka BlazingSmith here in the office. He is in agreement to the plan of care. Will check labs, update echo. If he has recurrence, he will need event monitor placed.   Patient is agreeable to this plan and will call if any problems develop in the interim.   Rosalio MacadamiaLori C. Vinny Taranto, RN, ANP-C [facility] [address] [place], [place]  [address] [phone]  \

## 2014-09-05 NOTE — Telephone Encounter (Signed)
New Message     Patient needs to schedule an appt : patient states heart is skipping around and out of beat. He says that Dr. Katrinka BlazingSmith states that anytime his heart is out of rhythum he needs to schedule an appt.  Please call Patient  Thanks

## 2014-09-09 ENCOUNTER — Encounter: Payer: Self-pay | Admitting: Cardiology

## 2014-09-13 ENCOUNTER — Ambulatory Visit (HOSPITAL_COMMUNITY): Payer: 59 | Attending: Cardiology | Admitting: Cardiology

## 2014-09-13 DIAGNOSIS — R002 Palpitations: Secondary | ICD-10-CM

## 2014-09-13 DIAGNOSIS — Z95810 Presence of automatic (implantable) cardiac defibrillator: Secondary | ICD-10-CM | POA: Diagnosis not present

## 2014-09-13 DIAGNOSIS — I25810 Atherosclerosis of coronary artery bypass graft(s) without angina pectoris: Secondary | ICD-10-CM | POA: Diagnosis not present

## 2014-09-13 DIAGNOSIS — I255 Ischemic cardiomyopathy: Secondary | ICD-10-CM | POA: Diagnosis not present

## 2014-09-13 DIAGNOSIS — I5022 Chronic systolic (congestive) heart failure: Secondary | ICD-10-CM | POA: Diagnosis not present

## 2014-09-13 DIAGNOSIS — E785 Hyperlipidemia, unspecified: Secondary | ICD-10-CM

## 2014-09-13 NOTE — Progress Notes (Signed)
Echo performed. 

## 2014-09-21 ENCOUNTER — Encounter: Payer: Self-pay | Admitting: Internal Medicine

## 2014-09-23 ENCOUNTER — Telehealth: Payer: Self-pay | Admitting: Interventional Cardiology

## 2014-09-23 DIAGNOSIS — R002 Palpitations: Secondary | ICD-10-CM

## 2014-09-23 NOTE — Telephone Encounter (Signed)
Returned patient's call. Patient's PCP wants to check patient's cholesterol level, and patient wanted to make sure our office had not already done this. Informed patient that cholesterol was not checked, so he should have it checked with PCP. Informed patient that I would forward his message concerning his pacemaker to EP triage.

## 2014-09-23 NOTE — Telephone Encounter (Signed)
New Msg        Pt calling, states at last appt on 09/05/14 he was told his pacemaker was going to be res

## 2014-09-23 NOTE — Telephone Encounter (Signed)
New Msg       Pt calling states at last appt on 09/05/14 he was told his pacemaker was going to be reset and hasn't heard back,   Pt is also concerned about his cholesterol levels and wants to know if that was checked?   Please return pt call.

## 2014-09-30 NOTE — Telephone Encounter (Signed)
Pt states my heart has been going, "thumpity thumpity thumpity." At times, it occurs for a three hour period. Pt's states palpitations occur intermittently. Pt can see his chest jumping around when sitting down. Pt does not have sensations while moving or while exercising on his elliptical. No SOB, no angina. No presyncope or syncope.  No alerts or episodes on Carelink.   Pt understands his device will not provide therapy unless his rate reaches 200bpm. I explained to pt that if he reaches 200bpm, he will likely have accompanying symptoms such as pre/syncope, SOB, or palps. Pt aware that intermittent PVCs, unless in the detection zone, are not managed by his device.

## 2014-09-30 NOTE — Telephone Encounter (Signed)
Please schedule a 48 hour Holter monitor

## 2014-10-03 ENCOUNTER — Other Ambulatory Visit: Payer: Self-pay | Admitting: Interventional Cardiology

## 2014-10-03 NOTE — Telephone Encounter (Signed)
Pt aware of Dr.Smith's recommendation .pt needs  a 48 hour Holter monitor. Adv him a scheduler from our office will call him to schedule. He verbalized understanding.

## 2014-10-03 NOTE — Addendum Note (Signed)
Addended by: Jarvis NewcomerPARRIS-GODLEY, LISA S on: 10/03/2014 09:41 AM   Modules accepted: Orders

## 2014-10-11 ENCOUNTER — Other Ambulatory Visit: Payer: Self-pay

## 2014-10-11 ENCOUNTER — Encounter: Payer: Self-pay | Admitting: *Deleted

## 2014-10-11 ENCOUNTER — Encounter (INDEPENDENT_AMBULATORY_CARE_PROVIDER_SITE_OTHER): Payer: 59

## 2014-10-11 DIAGNOSIS — R002 Palpitations: Secondary | ICD-10-CM

## 2014-10-11 MED ORDER — LISINOPRIL 20 MG PO TABS: 20.0000 mg | ORAL_TABLET | Freq: Every day | ORAL | Status: DC

## 2014-10-11 NOTE — Progress Notes (Signed)
Patient ID: [name] [name], male   DOB: 12/06/1960, 54 y.o.   MRN: [id number] [facility] 48 hour holter monitor applied to patient.

## 2014-10-12 ENCOUNTER — Encounter: Payer: Self-pay | Admitting: *Deleted

## 2014-10-12 NOTE — Progress Notes (Signed)
Patient ID: [name] [name], male   DOB: 08/26/1961, 54 y.o.   MRN: [id number] Patients' holter monitor was applied 10/11/14 with electrodes for sensitive skin due to adhesive tape allergy.  Sensitive skin electrodes kept falling off.  Patient came back in 10/12/14.  48 Hour holter monitor started again using regular electrodes.  Expected end of service will be Friday, 10/14/14, at 10AM.

## 2014-11-02 ENCOUNTER — Telehealth: Payer: Self-pay

## 2014-11-02 NOTE — Telephone Encounter (Signed)
[email or web address] to give pt holter monitor results

## 2014-11-03 NOTE — Telephone Encounter (Signed)
Message routed to Dr.Smith to call pt

## 2014-11-03 NOTE — Telephone Encounter (Signed)
Follow up      Pt request to talk to Dr Smith only----noKatrinka Blazing tech, nurse or PA.  He want to tell Dr Katrinka BlazingSmith what is going on with his heart and to let him know he still has not received the results from his monitor.  Please call after 1pm

## 2014-11-03 NOTE — Telephone Encounter (Signed)
Spoke to patient

## 2014-11-13 ENCOUNTER — Other Ambulatory Visit: Payer: Self-pay | Admitting: Interventional Cardiology

## 2014-11-30 ENCOUNTER — Other Ambulatory Visit: Payer: Self-pay | Admitting: Interventional Cardiology

## 2014-12-01 NOTE — Telephone Encounter (Signed)
Rosalio MacadamiaLori C Gerhardt, NP at 09/05/2014 1:29 PM  carvedilol (COREG) 12.5 MG tablet TAKE ONE TABLET BY MOUTH TWICE DAILY Stay on your current medicines for now.   Refills requested by pt: Last refill sent in without any remaining refills. Not sure why.

## 2014-12-05 ENCOUNTER — Telehealth: Payer: Self-pay | Admitting: Cardiology

## 2014-12-05 ENCOUNTER — Ambulatory Visit (INDEPENDENT_AMBULATORY_CARE_PROVIDER_SITE_OTHER): Payer: 59 | Admitting: *Deleted

## 2014-12-05 DIAGNOSIS — I255 Ischemic cardiomyopathy: Secondary | ICD-10-CM

## 2014-12-05 DIAGNOSIS — I2589 Other forms of chronic ischemic heart disease: Secondary | ICD-10-CM

## 2014-12-05 NOTE — Telephone Encounter (Signed)
Spoke with pt and reminded pt of remote transmission that is due today. Pt verbalized understanding.   

## 2014-12-06 ENCOUNTER — Encounter: Payer: Self-pay | Admitting: Cardiology

## 2014-12-06 DIAGNOSIS — I255 Ischemic cardiomyopathy: Secondary | ICD-10-CM

## 2014-12-06 LAB — MDC_IDC_ENUM_SESS_TYPE_REMOTE
Date Time Interrogation Session: 20160412144912
HighPow Impedance: 47 Ohm
HighPow Impedance: 58 Ohm
Lead Channel Sensing Intrinsic Amplitude: 19.89 mV
Lead Channel Setting Sensing Sensitivity: 0.3 mV
MDC IDC MSMT BATTERY VOLTAGE: 2.62 V
MDC IDC MSMT LEADCHNL RV IMPEDANCE VALUE: 520 Ohm
MDC IDC SET LEADCHNL RV PACING AMPLITUDE: 2.5 V
MDC IDC SET LEADCHNL RV PACING PULSEWIDTH: 0.4 ms
MDC IDC SET ZONE DETECTION INTERVAL: 350 ms
MDC IDC STAT BRADY RV PERCENT PACED: 0 %
Zone Setting Detection Interval: 300 ms
Zone Setting Detection Interval: 400 ms

## 2014-12-06 NOTE — Progress Notes (Signed)
Remote ICD transmission.   

## 2014-12-19 ENCOUNTER — Encounter: Payer: Self-pay | Admitting: Cardiology

## 2014-12-22 ENCOUNTER — Encounter: Payer: Self-pay | Admitting: Internal Medicine

## 2015-01-06 ENCOUNTER — Ambulatory Visit (INDEPENDENT_AMBULATORY_CARE_PROVIDER_SITE_OTHER): Payer: 59 | Admitting: *Deleted

## 2015-01-06 DIAGNOSIS — I255 Ischemic cardiomyopathy: Secondary | ICD-10-CM

## 2015-01-06 LAB — CUP PACEART REMOTE DEVICE CHECK
Battery Voltage: 2.62 V
Date Time Interrogation Session: 20160513063328
HighPow Impedance: 48 Ohm
HighPow Impedance: 58 Ohm
Lead Channel Impedance Value: 520 Ohm
Lead Channel Setting Pacing Amplitude: 2.5 V
Lead Channel Setting Sensing Sensitivity: 0.3 mV
MDC IDC MSMT LEADCHNL RV SENSING INTR AMPL: 19.89 mV
MDC IDC SET LEADCHNL RV PACING PULSEWIDTH: 0.4 ms
MDC IDC SET ZONE DETECTION INTERVAL: 350 ms
MDC IDC SET ZONE DETECTION INTERVAL: 400 ms
MDC IDC STAT BRADY RV PERCENT PACED: 0.02 %
Zone Setting Detection Interval: 300 ms

## 2015-01-06 NOTE — Progress Notes (Signed)
Remote ICD transmission.   

## 2015-01-13 ENCOUNTER — Encounter: Payer: Self-pay | Admitting: Cardiology

## 2015-01-17 ENCOUNTER — Encounter: Payer: Self-pay | Admitting: Internal Medicine

## 2015-02-06 ENCOUNTER — Ambulatory Visit (INDEPENDENT_AMBULATORY_CARE_PROVIDER_SITE_OTHER): Payer: 59 | Admitting: *Deleted

## 2015-02-06 DIAGNOSIS — Z95810 Presence of automatic (implantable) cardiac defibrillator: Secondary | ICD-10-CM

## 2015-02-06 NOTE — Progress Notes (Signed)
Remote ICD transmission.   

## 2015-02-10 ENCOUNTER — Other Ambulatory Visit: Payer: Self-pay | Admitting: *Deleted

## 2015-02-10 LAB — CUP PACEART REMOTE DEVICE CHECK
Battery Voltage: 2.62 V
Date Time Interrogation Session: 20160613062507
HIGH POWER IMPEDANCE MEASURED VALUE: 50 Ohm
HighPow Impedance: 62 Ohm
Lead Channel Impedance Value: 520 Ohm
Lead Channel Setting Sensing Sensitivity: 0.3 mV
MDC IDC MSMT LEADCHNL RV SENSING INTR AMPL: 19.89 mV
MDC IDC SET LEADCHNL RV PACING AMPLITUDE: 2.5 V
MDC IDC SET LEADCHNL RV PACING PULSEWIDTH: 0.4 ms
MDC IDC SET ZONE DETECTION INTERVAL: 300 ms
MDC IDC STAT BRADY RV PERCENT PACED: 0.09 %
Zone Setting Detection Interval: 350 ms
Zone Setting Detection Interval: 400 ms

## 2015-02-10 MED ORDER — NITROGLYCERIN 0.4 MG SL SUBL: 0.4000 mg | SUBLINGUAL_TABLET | SUBLINGUAL | Status: DC | PRN

## 2015-02-10 MED ORDER — CARVEDILOL 12.5 MG PO TABS: 12.5000 mg | ORAL_TABLET | Freq: Two times a day (BID) | ORAL | Status: DC

## 2015-02-15 ENCOUNTER — Encounter: Payer: Self-pay | Admitting: Cardiology

## 2015-02-17 ENCOUNTER — Encounter: Payer: Self-pay | Admitting: Internal Medicine

## 2015-03-06 ENCOUNTER — Other Ambulatory Visit: Payer: Self-pay | Admitting: Interventional Cardiology

## 2015-03-06 ENCOUNTER — Other Ambulatory Visit: Payer: Self-pay

## 2015-03-06 MED ORDER — LISINOPRIL 20 MG PO TABS: 20.0000 mg | ORAL_TABLET | Freq: Every day | ORAL | Status: DC

## 2015-03-13 ENCOUNTER — Ambulatory Visit (INDEPENDENT_AMBULATORY_CARE_PROVIDER_SITE_OTHER): Payer: 59 | Admitting: *Deleted

## 2015-03-13 DIAGNOSIS — I255 Ischemic cardiomyopathy: Secondary | ICD-10-CM | POA: Diagnosis not present

## 2015-03-13 NOTE — Progress Notes (Signed)
Remote ICD transmission.   

## 2015-03-14 LAB — CUP PACEART REMOTE DEVICE CHECK
Battery Voltage: 2.62 V
HighPow Impedance: 47 Ohm
HighPow Impedance: 58 Ohm
Lead Channel Impedance Value: 504 Ohm
Lead Channel Sensing Intrinsic Amplitude: 19.89 mV
Lead Channel Setting Pacing Amplitude: 2.5 V
Lead Channel Setting Pacing Pulse Width: 0.4 ms
MDC IDC SESS DTM: 20160718073327
MDC IDC SET LEADCHNL RV SENSING SENSITIVITY: 0.3 mV
MDC IDC SET ZONE DETECTION INTERVAL: 300 ms
MDC IDC SET ZONE DETECTION INTERVAL: 350 ms
MDC IDC SET ZONE DETECTION INTERVAL: 400 ms
MDC IDC STAT BRADY RV PERCENT PACED: 0.04 %

## 2015-03-22 ENCOUNTER — Other Ambulatory Visit: Payer: Self-pay

## 2015-03-22 ENCOUNTER — Other Ambulatory Visit: Payer: Self-pay | Admitting: Interventional Cardiology

## 2015-03-22 MED ORDER — CLOPIDOGREL BISULFATE 75 MG PO TABS: 75.0000 mg | ORAL_TABLET | Freq: Every day | ORAL | Status: DC

## 2015-03-31 ENCOUNTER — Encounter: Payer: Self-pay | Admitting: Cardiology

## 2015-04-04 ENCOUNTER — Other Ambulatory Visit: Payer: Self-pay | Admitting: Interventional Cardiology

## 2015-04-04 ENCOUNTER — Encounter: Payer: Self-pay | Admitting: Internal Medicine

## 2015-04-10 ENCOUNTER — Ambulatory Visit (INDEPENDENT_AMBULATORY_CARE_PROVIDER_SITE_OTHER): Payer: 59 | Admitting: *Deleted

## 2015-04-10 ENCOUNTER — Telehealth: Payer: Self-pay | Admitting: *Deleted

## 2015-04-10 DIAGNOSIS — Z95810 Presence of automatic (implantable) cardiac defibrillator: Secondary | ICD-10-CM | POA: Diagnosis not present

## 2015-04-10 NOTE — Progress Notes (Signed)
Remote ICD transmission.   

## 2015-04-10 NOTE — Telephone Encounter (Signed)
Pt's device reached RRT on 04/08/15. Pt has been alerted each morning at 7am. I offered pt a courtesy appt to turn off notifier. Pt prefers to leave alert tone on. Pt aware he will hear alert tone each morning unless we disable it. Pt aware ROV w/ Dr. [name] 05/12/15 to discuss changeout.

## 2015-04-12 LAB — CUP PACEART REMOTE DEVICE CHECK
Date Time Interrogation Session: 20160815052508
HIGH POWER IMPEDANCE MEASURED VALUE: 47 Ohm
HIGH POWER IMPEDANCE MEASURED VALUE: 57 Ohm
Lead Channel Impedance Value: 504 Ohm
Lead Channel Sensing Intrinsic Amplitude: 19.89 mV
Lead Channel Setting Pacing Amplitude: 2.5 V
MDC IDC MSMT BATTERY VOLTAGE: 2.61 V
MDC IDC SET LEADCHNL RV PACING PULSEWIDTH: 0.4 ms
MDC IDC SET LEADCHNL RV SENSING SENSITIVITY: 0.3 mV
MDC IDC SET ZONE DETECTION INTERVAL: 300 ms
MDC IDC SET ZONE DETECTION INTERVAL: 350 ms
MDC IDC STAT BRADY RV PERCENT PACED: 0.02 %
Zone Setting Detection Interval: 400 ms

## 2015-04-13 ENCOUNTER — Ambulatory Visit (INDEPENDENT_AMBULATORY_CARE_PROVIDER_SITE_OTHER): Payer: 59 | Admitting: *Deleted

## 2015-04-13 DIAGNOSIS — I255 Ischemic cardiomyopathy: Secondary | ICD-10-CM

## 2015-04-13 DIAGNOSIS — I5022 Chronic systolic (congestive) heart failure: Secondary | ICD-10-CM | POA: Diagnosis not present

## 2015-04-13 LAB — CUP PACEART INCLINIC DEVICE CHECK
Brady Statistic RV Percent Paced: 0.1 % — CL
Lead Channel Impedance Value: 528 Ohm
Lead Channel Sensing Intrinsic Amplitude: 19.9 mV
MDC IDC MSMT BATTERY VOLTAGE: 2.62 V
MDC IDC SESS DTM: 20160818160912

## 2015-04-13 NOTE — Progress Notes (Signed)
ICD check in clinic to d/c audible alert (N/C). No evidence of any ventricular arrhythmias. Device and monitor alerts for RRT d/c'd. ERI reached on 04/08/2015. Plan to follow up with Dr.Taylor as scheduled.

## 2015-04-27 ENCOUNTER — Encounter: Payer: Self-pay | Admitting: Cardiology

## 2015-05-02 ENCOUNTER — Encounter: Payer: Self-pay | Admitting: Internal Medicine

## 2015-05-09 ENCOUNTER — Encounter: Payer: Self-pay | Admitting: Internal Medicine

## 2015-05-12 ENCOUNTER — Encounter: Payer: Self-pay | Admitting: Interventional Cardiology

## 2015-05-12 ENCOUNTER — Ambulatory Visit (INDEPENDENT_AMBULATORY_CARE_PROVIDER_SITE_OTHER): Payer: 59 | Admitting: Internal Medicine

## 2015-05-12 ENCOUNTER — Encounter: Payer: Self-pay | Admitting: Internal Medicine

## 2015-05-12 ENCOUNTER — Ambulatory Visit (INDEPENDENT_AMBULATORY_CARE_PROVIDER_SITE_OTHER): Payer: 59 | Admitting: Interventional Cardiology

## 2015-05-12 VITALS — BP 108/74 | HR 59 | Ht 72.5 in | Wt 213.8 lb

## 2015-05-12 VITALS — BP 108/74 | HR 59 | Ht 72.0 in | Wt 213.8 lb

## 2015-05-12 DIAGNOSIS — Z95810 Presence of automatic (implantable) cardiac defibrillator: Secondary | ICD-10-CM

## 2015-05-12 DIAGNOSIS — I5022 Chronic systolic (congestive) heart failure: Secondary | ICD-10-CM | POA: Diagnosis not present

## 2015-05-12 DIAGNOSIS — I25810 Atherosclerosis of coronary artery bypass graft(s) without angina pectoris: Secondary | ICD-10-CM | POA: Diagnosis not present

## 2015-05-12 DIAGNOSIS — R0683 Snoring: Secondary | ICD-10-CM

## 2015-05-12 DIAGNOSIS — E785 Hyperlipidemia, unspecified: Secondary | ICD-10-CM

## 2015-05-12 DIAGNOSIS — R0989 Other specified symptoms and signs involving the circulatory and respiratory systems: Secondary | ICD-10-CM

## 2015-05-12 NOTE — Assessment & Plan Note (Signed)
His CHF remains class 1-2. He has an EF of 25% by echo. Will plan to schedule ICD generator change out. He wants to wait until January due to insurance reasons. His device should last this long at ERI. He has never been shocked.

## 2015-05-12 NOTE — Assessment & Plan Note (Signed)
His device is at [facility]. Will schedule gen change in January. Patient's preference.

## 2015-05-12 NOTE — Progress Notes (Signed)
HPI [name] [name] returns today for followup. He is a very pleasant 54 year-old man with an ischemic cardiomyopathy, status post myocardial infarction, chronic class I congestive heart failure, status post ICD implantation. In the interim, he has done well. He denies chest pain, shortness of breath, or peripheral edema. No syncope or ICD shock. He has reached ERI. His EF back in January was 25% by echo.  Allergies  Allergen Reactions  . Adhesive [Tape] Other (See Comments)    REACTION: "IT BURNS ME"  . Eggs Or Egg-Derived Products Nausea And Vomiting  . Penicillins Rash     Current Outpatient Prescriptions  Medication Sig Dispense Refill  . aspirin 325 MG EC tablet Take 325 mg by mouth daily.    . carvedilol (COREG) 12.5 MG tablet Take 1 tablet (12.5 mg total) by mouth 2 (two) times daily. 180 tablet 2  . clopidogrel (PLAVIX) 75 MG tablet Take 1 tablet (75 mg total) by mouth daily. 30 tablet 5  . CRESTOR 40 MG tablet Take 20 mg by mouth daily.  5  . diphenoxylate-atropine (LOMOTIL) 2.5-0.025 MG per tablet Take 1 tablet by mouth 4 (four) times daily as needed for diarrhea or loose stools.     . fish oil-omega-3 fatty acids 1000 MG capsule Take 1 g by mouth daily.     Marland Kitchen levothyroxine (SYNTHROID, LEVOTHROID) 25 MCG tablet Take 25 mcg by mouth daily.    Marland Kitchen lisinopril (PRINIVIL,ZESTRIL) 20 MG tablet TAKE ONE TABLET BY MOUTH DAILY 90 tablet 0  . Melatonin 5 MG TABS Take 5 mg by mouth at bedtime.     . mirtazapine (REMERON) 45 MG tablet Take 45 mg by mouth at bedtime.    . nitroGLYCERIN (NITROSTAT) 0.4 MG SL tablet Place 1 tablet (0.4 mg total) under the tongue every 5 (five) minutes as needed. 25 tablet 3  . promethazine (PHENERGAN) 25 MG tablet Take 25 mg by mouth every 6 (six) hours as needed for nausea or vomiting.     No current facility-administered medications for this visit.     Past Medical History  Diagnosis Date  . CAD (coronary artery disease)     Anterior MI with emergent PCI  followed by emergent CABG x 3 in April of 2007 per Dr. Dorris Fetch with SVG to LAD and SVG to ramus & OM1  . MI (myocardial infarction) 11/2005  . Hyperlipidemia   . ICD (implantable cardioverter-defibrillator) in place   . Chronic systolic heart failure     ROS:   All systems reviewed and negative except as noted in the HPI.   Past Surgical History  Procedure Laterality Date  . Cardiac catheterization  2004, 2007  . Cardiac defibrillator placement  04/2006    [employer] [facility]-Dr. Amil Amen  . Vasectomy    . Cholecystectomy    . Coronary artery bypass graft  11/2005  . Carotid stent insertion       Family History  Problem Relation Age of Onset  . Heart attack Father      Social History   Social History  . Marital Status: Married    Spouse Name: N/A  . Number of Children: N/A  . Years of Education: N/A   Occupational History  . Not on file.   Social History Main Topics  . Smoking status: Never Smoker   . Smokeless tobacco: Never Used  . Alcohol Use: No  . Drug Use: No  . Sexual Activity: Not on file   Other Topics Concern  . Not on  file   Social History Narrative     BP 108/74 mmHg  Pulse 59  Ht 6' (1.829 m)  Wt 213 lb 12.8 oz (96.979 kg)  BMI 28.99 kg/m2  SpO2 95%  Physical Exam:  Well appearing middle-aged man, NAD HEENT: Unremarkable Neck:  No JVD, no thyromegally Lungs:  Clear with no wheezes, rales, or rhonchi. HEART:  Regular rate rhythm, no murmurs, no rubs, no clicks Abd:  soft, positive bowel sounds, no organomegally, no rebound, no guarding Ext:  2 plus pulses, no edema, no cyanosis, no clubbing Skin:  No rashes no nodules Neuro:  CN II through XII intact, motor grossly intact  DEVICE  Normal device function.  See PaceArt for details.   Assess/Plan:

## 2015-05-12 NOTE — Assessment & Plan Note (Signed)
He is pending sleep evaluation.

## 2015-05-12 NOTE — Progress Notes (Signed)
Cardiology Office Note   Date:  05/12/2015   ID:  Christophor, Eick 03-Jan-1961, MRN [id number]  PCP:  [name], MD  Cardiologist:  Lesleigh Noe, MD   Chief Complaint  Patient presents with  . Coronary Artery Disease      History of Present Illness: [name] [name] is a 54 y.o. male who presents for follow-up of ostial LAD Taxus DES stent, subsequent acute stent thrombosis 2007, emergency coronary bypass grafting with saphenous vein grafts 2007, chronic systolic heart failure with EF 25-30% January 2016, hyperlipidemia, and AICD.  [name] [name] is doing well. He denies angina and heart failure complaints. He is nearing end-of-life of his AICD and will need to have a battery change. There is no lower extremity swelling. No medication side effects. He has not needed to use nitroglycerin. He is still having difficulty with sleeping. He denies claudication.  Past Medical History  Diagnosis Date  . CAD (coronary artery disease)     Anterior MI with emergent PCI followed by emergent CABG x 3 in April of 2007 per Dr. Dorris Fetch with SVG to LAD and SVG to ramus & OM1  . MI (myocardial infarction) 11/2005  . Hyperlipidemia   . ICD (implantable cardioverter-defibrillator) in place   . Chronic systolic heart failure     Past Surgical History  Procedure Laterality Date  . Cardiac catheterization  2004, 2007  . Cardiac defibrillator placement  04/2006    [employer] [facility]-Dr. Amil Amen  . Vasectomy    . Cholecystectomy    . Coronary artery bypass graft  11/2005  . Carotid stent insertion       Current Outpatient Prescriptions  Medication Sig Dispense Refill  . aspirin 325 MG EC tablet Take 325 mg by mouth daily.    . carvedilol (COREG) 12.5 MG tablet Take 1 tablet (12.5 mg total) by mouth 2 (two) times daily. 180 tablet 2  . clopidogrel (PLAVIX) 75 MG tablet Take 1 tablet (75 mg total) by mouth daily. 30 tablet 5  . CRESTOR 40 MG tablet Take 20 mg by mouth daily.   5  . diphenoxylate-atropine (LOMOTIL) 2.5-0.025 MG per tablet Take 1 tablet by mouth 4 (four) times daily as needed for diarrhea or loose stools.     . fish oil-omega-3 fatty acids 1000 MG capsule Take 1 g by mouth daily.     Marland Kitchen levothyroxine (SYNTHROID, LEVOTHROID) 25 MCG tablet Take 25 mcg by mouth daily.    Marland Kitchen lisinopril (PRINIVIL,ZESTRIL) 20 MG tablet TAKE ONE TABLET BY MOUTH DAILY 90 tablet 0  . Melatonin 5 MG TABS Take 5 mg by mouth at bedtime.     . mirtazapine (REMERON) 45 MG tablet Take 45 mg by mouth at bedtime.    . nitroGLYCERIN (NITROSTAT) 0.4 MG SL tablet Place 1 tablet (0.4 mg total) under the tongue every 5 (five) minutes as needed. 25 tablet 3  . promethazine (PHENERGAN) 25 MG tablet Take 25 mg by mouth every 6 (six) hours as needed for nausea or vomiting.     No current facility-administered medications for this visit.    Allergies:   Adhesive; Eggs or egg-derived products; and Penicillins    Social History:  The patient  reports that he has never smoked. He has never used smokeless tobacco. He reports that he does not drink alcohol or use illicit drugs.   Family History:  The patient's family history includes Heart attack in his father.    ROS:  Please see the  history of present illness.   Otherwise, review of systems are positive for insomnia, snoring, back discomfort, palpitations..   All other systems are reviewed and negative.    PHYSICAL EXAM: VS:  BP 108/74 mmHg  Pulse 59  Ht 6' 0.5" (1.842 m)  Wt 96.979 kg (213 lb 12.8 oz)  BMI 28.58 kg/m2  SpO2 95% , BMI Body mass index is 28.58 kg/(m^2). GEN: Well nourished, well developed, in no acute distress HEENT: normal Neck: no JV. There is a right carotid bruits. Cardiac: RRR.  There has no murmur, rub, or gallop. There is no edema. Respiratory:  clear to auscultation bilaterally, normal work of breathing. GI: soft, nontender, nondistended, + BS MS: no deformity or atrophy Skin: warm and dry, no rash Neuro:   Strength and sensation are intact Psych: euthymic mood, full affect   EKG:  EKG is not ordered todal. Last EKG performed at time of device check   Recent Labs: 09/05/2014: BUN 16; Creatinine, Ser 1.2; Hemoglobin 15.9; Platelets 188.0; Potassium 4.1; Sodium 141; TSH 1.59    Lipid Panel No results found for: CHOL, TRIG, HDL, CHOLHDL, VLDL, LDLCALC, LDLDIRECT    Wt Readings from Last 3 Encounters:  05/12/15 96.979 kg (213 lb 12.8 oz)  05/12/15 96.979 kg (213 lb 12.8 oz)  09/05/14 96.344 kg (212 lb 6.4 oz)      Other studies Reviewed: Additional studies/ records that were reviewed today include: Old echo report. Prior EKGs.. The findings include no new findings..    ASSESSMENT AND PLAN:  1. Chronic systolic heart failure No evidence of volume overload  2. Coronary artery disease involving coronary bypass graft of native heart without angina pectoris Denies angina. Needs ischemic evaluation as a screen. No testing and over 5 years.  3. Hyperlipidemia On therapy and followed by primary care.  4. Automatic implantable cardioverter-defibrillator in situ Near time for battery change  5. Right carotid bruit Needs bilateral carotid Doppler  '6. Sleep disturbance with snoring and insomnia Rule out sleep apnea   Current medicines are reviewed at length with the patient today.  The patient has the following concerns regarding medicines: None.  The following changes/actions have been instituted:    Lexiscan myocardial perfusion imaging  Bilateral carotid Doppler  Sleep study  Aerobic activity  Labs/ tests ordered today include:   Orders Placed This Encounter  Procedures  . Myocardial Perfusion Imaging  . Split night study   Bilateral carotid Doppler   Disposition:   FU with HS in 1 year  Signed, Lesleigh Noe, MD  05/12/2015 9:01 AM    [facility] [address] [place], [place], [place]  [address] Phone: [phone]; Fax: [phone]

## 2015-05-12 NOTE — Patient Instructions (Signed)
Medication Instructions:  Your physician recommends that you continue on your current medications as directed. Please refer to the Current Medication list given to you today.   Labwork: None ordered  Testing/Procedures: Your physician has requested that you have a carotid duplex. This test is an ultrasound of the carotid arteries in your neck. It looks at blood flow through these arteries that supply the brain with blood. Allow one hour for this exam. There are no restrictions or special instructions.  Your physician has requested that you have a lexiscan myoview. For further information please visit [email or web address] Please follow instruction sheet, as given.  Your physician has recommended that you have a sleep study. This test records several body functions during sleep, including: brain activity, eye movement, oxygen and carbon dioxide blood levels, heart rate and rhythm, breathing rate and rhythm, the flow of air through your mouth and nose, snoring, body muscle movements, and chest and belly movement.    Follow-Up: Your physician wants you to follow-up in: 1 year with Dr.Smith You will receive a reminder letter in the mail two months in advance. If you don't receive a letter, please call our office to schedule the follow-up appointment.   Any Other Special Instructions Will Be Listed Below (If Applicable).

## 2015-05-12 NOTE — Patient Instructions (Signed)
Medication Instructions:  Your physician recommends that you continue on your current medications as directed. Please refer to the Current Medication list given to you today.  Labwork: None ordered  Testing/Procedures: None ordered  Follow-Up: Your physician recommends that you schedule a follow-up appointment in: December with Gypsy Balsam, NP  (for H&P, labs & schedule ICD generator change)  Any Other Special Instructions Will Be Listed Below (If Applicable). Thank you for choosing [facility]!!

## 2015-07-08 ENCOUNTER — Other Ambulatory Visit: Payer: Self-pay | Admitting: Interventional Cardiology

## 2015-08-14 ENCOUNTER — Ambulatory Visit: Payer: 59 | Admitting: Nurse Practitioner

## 2015-08-15 NOTE — Progress Notes (Signed)
Electrophysiology Office Note Date: 08/16/2015  ID:  [name] [name], [name] [name] 04-18-1961, MRN [id number]  PCP: [name], MD Primary Cardiologist: Katrinka Blazing Electrophysiologist: [name]  CC: discuss ICD generator change  [name] [name] is a 54 y.o. male seen today for Dr [name].  His ICD reached ERI earlier this fall and he preferred to wait until now to have generator change.  He presents today for evaluation for ICD generator change. Since last being seen in our clinic, the patient reports doing very well. He denies chest pain, palpitations, dyspnea, PND, orthopnea, nausea, vomiting, dizziness, syncope, edema, weight gain, or early satiety.  He has not had ICD shocks.   Echo 08/2014 demonstrated EF 25-30%, akinesis of anteroseptal and apical myocardium, mild MR, mild AR.   He remains very active and has no functional limitations. He is able to push mow his lawn and work on the elliptical for >30 minutes without chest pain or shortness of breath. He has no HF symptoms.   Device History: MDT single chamber ICD implanted 2007 for ICM History of appropriate therapy: no History of AAD therapy: no   Past Medical History  Diagnosis Date  . CAD (coronary artery disease)     Anterior MI with emergent PCI followed by emergent CABG x 3 in April of 2007 per Dr. Dorris Fetch with SVG to LAD and SVG to ramus & OM1  . MI (myocardial infarction) (HCC) 11/2005  . Hyperlipidemia   . ICD (implantable cardioverter-defibrillator) in place   . Chronic systolic heart failure [facility])    Past Surgical History  Procedure Laterality Date  . Cardiac catheterization  2004, 2007  . Cardiac defibrillator placement  04/2006    [employer] [facility]-Dr. Amil Amen  . Vasectomy    . Cholecystectomy    . Coronary artery bypass graft  11/2005  . Carotid stent insertion      Current Outpatient Prescriptions  Medication Sig Dispense Refill  . aspirin 325 MG EC tablet Take 325 mg by mouth daily.    . carvedilol  (COREG) 12.5 MG tablet Take 1 tablet (12.5 mg total) by mouth 2 (two) times daily. 180 tablet 2  . clopidogrel (PLAVIX) 75 MG tablet Take 1 tablet (75 mg total) by mouth daily. 30 tablet 5  . CRESTOR 40 MG tablet Take 20 mg by mouth daily.  5  . diphenoxylate-atropine (LOMOTIL) 2.5-0.025 MG per tablet Take 1 tablet by mouth 4 (four) times daily as needed for diarrhea or loose stools.     . fish oil-omega-3 fatty acids 1000 MG capsule Take 1 g by mouth daily.     Marland Kitchen levothyroxine (SYNTHROID, LEVOTHROID) 25 MCG tablet Take 25 mcg by mouth daily.    Marland Kitchen lisinopril (PRINIVIL,ZESTRIL) 20 MG tablet TAKE ONE TABLET BY MOUTH DAILY 90 tablet 2  . Melatonin 5 MG TABS Take 5 mg by mouth at bedtime.     . mirtazapine (REMERON) 45 MG tablet Take 45 mg by mouth at bedtime.    . nitroGLYCERIN (NITROSTAT) 0.4 MG SL tablet Place 1 tablet (0.4 mg total) under the tongue every 5 (five) minutes as needed. 25 tablet 3  . promethazine (PHENERGAN) 25 MG tablet Take 25 mg by mouth every 6 (six) hours as needed for nausea or vomiting.     No current facility-administered medications for this visit.    Allergies:   Adhesive; Eggs or egg-derived products; and Penicillins   Social History: Social History   Social History  . Marital Status: Married  Spouse Name: N/A  . Number of Children: N/A  . Years of Education: N/A   Occupational History  . Not on file.   Social History Main Topics  . Smoking status: Never Smoker   . Smokeless tobacco: Never Used  . Alcohol Use: No  . Drug Use: No  . Sexual Activity: Not on file   Other Topics Concern  . Not on file   Social History Narrative    Family History: Family History  Problem Relation Age of Onset  . Heart attack Father     Review of Systems: All other systems reviewed and are otherwise negative except as noted above.   Physical Exam: VS:  BP 112/80 mmHg  Pulse 58  Ht 6' (1.829 m)  Wt 214 lb 9.6 oz (97.342 kg)  BMI 29.10 kg/m2 , BMI Body mass  index is 29.1 kg/(m^2).  GEN- The patient is well appearing, alert and oriented x 3 today.   HEENT: normocephalic, atraumatic; sclera clear, conjunctiva pink; hearing intact; oropharynx clear; neck supple, no JVP Lymph- no cervical lymphadenopathy Lungs- Clear to ausculation bilaterally, normal work of breathing.  No wheezes, rales, rhonchi Heart- Regular rate and rhythm, no murmurs, rubs or gallops GI- soft, non-tender, non-distended, bowel sounds present Extremities- no clubbing, cyanosis, or edema; DP/PT/radial pulses 2+ bilaterally MS- no significant deformity or atrophy Skin- warm and dry, no rash or lesion; ICD pocket well healed Psych- euthymic mood, full affect Neuro- strength and sensation are intact  ICD interrogation- reviewed in detail today,  See PACEART report  EKG:  EKG is ordered today. The ekg ordered today shows sinus rhythm, IVCD QRS 148msec  Recent Labs: 09/05/2014: BUN 16; Creatinine, Ser 1.2; Hemoglobin 15.9; Platelets 188.0; Potassium 4.1; Sodium 141; TSH 1.59   Wt Readings from Last 3 Encounters:  08/16/15 214 lb 9.6 oz (97.342 kg)  05/12/15 213 lb 12.8 oz (96.979 kg)  05/12/15 213 lb 12.8 oz (96.979 kg)     Other studies Reviewed: Additional studies/ records that were reviewed today include: Dr Lubertha Basqueaylor's notes, echo  Assessment and Plan:  1.  Chronic systolic dysfunction euvolemic today [facility]  Risks, benefits to generator change discussed with patient today who would like to proceed. He has IVCD with QRS of >13430msec but no HF symptoms.  Will schedule single chamber ICD gen change with Dr Ladona Ridgelaylor at next available time.  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   2.  ICM/CAD No recent ischemic symptoms Consider decreasing ASA to 81mg  daily, will defer to  Dr Katrinka BlazingSmith Continue medical therapy     Current medicines are reviewed at length with the patient today.   The patient does not have concerns regarding his medicines.  The following changes were made today:  none  Labs/ tests ordered today include: none   Disposition:   Follow up with Dr Ladona Ridgelaylor after generator change, Dr Katrinka BlazingSmith as scheduled   Signed, Gypsy BalsamAmber Seiler, NP 08/16/2015 12:15 PM  [facility] [address] [place] [address] [phone] (office) [phone] (fax)

## 2015-08-16 ENCOUNTER — Encounter: Payer: Self-pay | Admitting: Internal Medicine

## 2015-08-16 ENCOUNTER — Encounter: Payer: Self-pay | Admitting: *Deleted

## 2015-08-16 ENCOUNTER — Ambulatory Visit (INDEPENDENT_AMBULATORY_CARE_PROVIDER_SITE_OTHER): Payer: 59 | Admitting: Nurse Practitioner

## 2015-08-16 ENCOUNTER — Encounter: Payer: Self-pay | Admitting: Nurse Practitioner

## 2015-08-16 VITALS — BP 112/80 | HR 58 | Ht 72.0 in | Wt 214.6 lb

## 2015-08-16 DIAGNOSIS — I255 Ischemic cardiomyopathy: Secondary | ICD-10-CM

## 2015-08-16 DIAGNOSIS — I5022 Chronic systolic (congestive) heart failure: Secondary | ICD-10-CM

## 2015-08-16 NOTE — Patient Instructions (Signed)
Medication Instructions:   CONTINUE SAME MEDICATIONS   If you need a refill on your cardiac medications before your next appointment, please call your pharmacy.   Labwork:  WILL BE DRAWN MORNING OF PROCEDURE    Testing/Procedures:  SEE LETTER FOR GENERATOR CHANGE ON    Follow-Up:  10 DAY  WOUND CHECK WITH DEVICE CLINIC  09/18/15   31 DAY FOLLOW UP PHY DEFIB  WITH DR Ladona RidgelAYLOR 12/06/15     Any Other Special Instructions Will Be Listed Below (If Applicable).

## 2015-08-25 ENCOUNTER — Telehealth: Payer: Self-pay | Admitting: Interventional Cardiology

## 2015-08-25 NOTE — Telephone Encounter (Signed)
I spoke with the pt and he would like Dr Michaelle CopasSmith's opinion in regards to his EKG from 08/16/15 office visit.  The pt is scheduled for ICD generator change on 09/06/15 with Dr Ladona Ridgelaylor. The pt states that [employer] Seiler NP did speak with him about needing a 2nd lead placed in the near future (BiV upgrade) and he would like to get Dr Michaelle CopasSmith's thoughts in regards to needing BiV ICD.  The pt feels like this should go ahead and be done 09/06/15 if it will be needed in the future.  I made the pt aware that I will forward this information to Dr Katrinka BlazingSmith for review.

## 2015-08-25 NOTE — Telephone Encounter (Signed)
°  New Prob   Pt is requesting to speak to nurse/Dr. Katrinka BlazingSmith regarding getting an additional lead after changes on last EKG. Please call.

## 2015-08-25 NOTE — Telephone Encounter (Signed)
I generally agree with the recommendation for resynchronization. QRSD where benefit starts is 150 msec; on his most recent ECG QRSD was 148 msec.

## 2015-08-30 NOTE — Telephone Encounter (Signed)
Returned pt call. Pt is aware of Dr.Smith's response below "I generally agree with the recommendation for resynchronization. QRSD where benefit starts is 150 msec; on his most recent ECG QRSD was 148 msec" Pt ICD generator change is scheduled for 09/06/15. Pt sts that he would want to have his BiV ICD done during that procedure. Adv pt that I will fwd the message to Dr.Taylor and his nurse Tresa EndoKelly to call him to discuss. Pt voiced appreciation and verbalized understanding.

## 2015-08-30 NOTE — Telephone Encounter (Signed)
Follow up      [name] [name] calling to see what Dr Katrinka BlazingSmith said about his upcoming procedure with Dr Ladona Ridgelaylor

## 2015-08-30 NOTE — Telephone Encounter (Signed)
The literature does not help us much here. I would suggest he have a 12 lead ECG prior to the procedure. If QRS greater than 150, then we would conisder a BiV upgrade. Recently additional criteria have been described in patient's with LBBB/IVCD pattern which help determine whether patient's might respond to biv pacing.

## 2015-08-31 NOTE — Telephone Encounter (Signed)
Spoke with patient and let him know Dr Lubertha Basqueaylor's recommendations and that we will obtain an EKG at the hospital the day of procedure.  He was appreciative of my call and verbalized understanding

## 2015-09-01 ENCOUNTER — Telehealth: Payer: Self-pay | Admitting: Interventional Cardiology

## 2015-09-01 NOTE — Telephone Encounter (Signed)
Message routed to Dr Smith

## 2015-09-01 NOTE — Telephone Encounter (Signed)
New problem   Pt need to speak to Dr Katrinka BlazingSmith only concerning his surgery on Wednesday. If he can't speak to Dr Katrinka BlazingSmith he might have to cancel his surgery.

## 2015-09-04 ENCOUNTER — Telehealth: Payer: Self-pay | Admitting: Interventional Cardiology

## 2015-09-04 NOTE — Telephone Encounter (Signed)
New message    Patient wants to cancel surgery on  1.11.2016 . Would like for Dr. Katrinka BlazingSmith and Dr. Ladona Ridgelaylor to negotiation with insurance company with additional wires .    Patient does not want to have back to back surgery.

## 2015-09-05 ENCOUNTER — Other Ambulatory Visit: Payer: Self-pay | Admitting: Interventional Cardiology

## 2015-09-05 NOTE — Telephone Encounter (Signed)
Dr.Smith has called pt directly

## 2015-09-06 ENCOUNTER — Encounter (HOSPITAL_COMMUNITY): Payer: Self-pay | Admitting: Internal Medicine

## 2015-09-06 ENCOUNTER — Encounter (HOSPITAL_COMMUNITY)
Admission: RE | Disposition: A | Discharge status: Home / Self Care | Payer: Self-pay | Source: Ambulatory Visit | Attending: Internal Medicine

## 2015-09-06 ENCOUNTER — Ambulatory Visit (HOSPITAL_COMMUNITY)
Admission: RE | Admit: 2015-09-06 | Discharge: 2015-09-06 | Disposition: A | Discharge status: Home / Self Care | Payer: 59 | Source: Ambulatory Visit | Attending: Internal Medicine | Admitting: Internal Medicine

## 2015-09-06 DIAGNOSIS — I2510 Atherosclerotic heart disease of native coronary artery without angina pectoris: Secondary | ICD-10-CM | POA: Insufficient documentation

## 2015-09-06 DIAGNOSIS — Z8249 Family history of ischemic heart disease and other diseases of the circulatory system: Secondary | ICD-10-CM | POA: Diagnosis not present

## 2015-09-06 DIAGNOSIS — Z7902 Long term (current) use of antithrombotics/antiplatelets: Secondary | ICD-10-CM | POA: Diagnosis not present

## 2015-09-06 DIAGNOSIS — E785 Hyperlipidemia, unspecified: Secondary | ICD-10-CM | POA: Insufficient documentation

## 2015-09-06 DIAGNOSIS — Z95810 Presence of automatic (implantable) cardiac defibrillator: Secondary | ICD-10-CM | POA: Diagnosis present

## 2015-09-06 DIAGNOSIS — I255 Ischemic cardiomyopathy: Secondary | ICD-10-CM | POA: Diagnosis not present

## 2015-09-06 DIAGNOSIS — Z880 Allergy status to penicillin: Secondary | ICD-10-CM | POA: Insufficient documentation

## 2015-09-06 DIAGNOSIS — I5022 Chronic systolic (congestive) heart failure: Secondary | ICD-10-CM | POA: Diagnosis not present

## 2015-09-06 DIAGNOSIS — Z7982 Long term (current) use of aspirin: Secondary | ICD-10-CM | POA: Insufficient documentation

## 2015-09-06 DIAGNOSIS — Z4502 Encounter for adjustment and management of automatic implantable cardiac defibrillator: Secondary | ICD-10-CM | POA: Insufficient documentation

## 2015-09-06 DIAGNOSIS — I252 Old myocardial infarction: Secondary | ICD-10-CM | POA: Diagnosis not present

## 2015-09-06 DIAGNOSIS — Z951 Presence of aortocoronary bypass graft: Secondary | ICD-10-CM | POA: Insufficient documentation

## 2015-09-06 HISTORY — PX: EP IMPLANTABLE DEVICE: SHX172B

## 2015-09-06 LAB — CBC
HEMATOCRIT: 46.6 % (ref 39.0–52.0)
Hemoglobin: 16.1 g/dL (ref 13.0–17.0)
MCH: 32.6 pg (ref 26.0–34.0)
MCHC: 34.5 g/dL (ref 30.0–36.0)
MCV: 94.3 fL (ref 78.0–100.0)
Platelets: 168 10*3/uL (ref 150–400)
RBC: 4.94 MIL/uL (ref 4.22–5.81)
RDW: 11.8 % (ref 11.5–15.5)
WBC: 6 10*3/uL (ref 4.0–10.5)

## 2015-09-06 LAB — SURGICAL PCR SCREEN
MRSA, PCR: NEGATIVE
Staphylococcus aureus: NEGATIVE

## 2015-09-06 LAB — BASIC METABOLIC PANEL
Anion gap: 8 (ref 5–15)
BUN: 13 mg/dL (ref 6–20)
CHLORIDE: 107 mmol/L (ref 101–111)
CO2: 24 mmol/L (ref 22–32)
Calcium: 9.1 mg/dL (ref 8.9–10.3)
Creatinine, Ser: 1.26 mg/dL — ABNORMAL HIGH (ref 0.61–1.24)
GFR calc Af Amer: >60 mL/min (ref >60)
GFR calc non Af Amer: >60 mL/min (ref >60)
GLUCOSE: 101 mg/dL — AB (ref 65–99)
POTASSIUM: 4.1 mmol/L (ref 3.5–5.1)
Sodium: 139 mmol/L (ref 135–145)

## 2015-09-06 SURGERY — ICD/BIV ICD GENERATOR CHANGEOUT

## 2015-09-06 MED ORDER — ACETAMINOPHEN 325 MG PO TABS: 325.0000 mg | ORAL_TABLET | ORAL | Status: DC | PRN

## 2015-09-06 MED ORDER — LIDOCAINE HCL (PF) 1 % IJ SOLN
INTRAMUSCULAR | Status: DC | PRN
  Administered 2015-09-06: 61 mL via INTRADERMAL

## 2015-09-06 MED ORDER — MIDAZOLAM HCL 5 MG/5ML IJ SOLN
INTRAMUSCULAR | Status: AC
  Filled 2015-09-06: qty 5

## 2015-09-06 MED ORDER — LIDOCAINE HCL (PF) 1 % IJ SOLN
INTRAMUSCULAR | Status: AC
  Filled 2015-09-06: qty 30

## 2015-09-06 MED ORDER — ONDANSETRON HCL 4 MG/2ML IJ SOLN: 4.0000 mg | Freq: Four times a day (QID) | INTRAMUSCULAR | Status: DC | PRN

## 2015-09-06 MED ORDER — SODIUM CHLORIDE 0.9 % IR SOLN
Status: AC
  Filled 2015-09-06: qty 2

## 2015-09-06 MED ORDER — MUPIROCIN 2 % EX OINT
TOPICAL_OINTMENT | CUTANEOUS | Status: AC
  Filled 2015-09-06: qty 22

## 2015-09-06 MED ORDER — GENTAMICIN SULFATE 40 MG/ML IJ SOLN
INTRAMUSCULAR | Status: AC
  Filled 2015-09-06: qty 2

## 2015-09-06 MED ORDER — FENTANYL CITRATE (PF) 100 MCG/2ML IJ SOLN
INTRAMUSCULAR | Status: DC | PRN
  Administered 2015-09-06 (×2): 25 ug via INTRAVENOUS
  Administered 2015-09-06: 12.5 ug via INTRAVENOUS
  Administered 2015-09-06: 25 ug via INTRAVENOUS
  Administered 2015-09-06: 12.5 ug via INTRAVENOUS
  Administered 2015-09-06: 25 ug via INTRAVENOUS
  Administered 2015-09-06: 12.5 ug via INTRAVENOUS
  Administered 2015-09-06: 25 ug via INTRAVENOUS

## 2015-09-06 MED ORDER — SODIUM CHLORIDE 0.9 % IV SOLN
INTRAVENOUS | Status: DC
  Administered 2015-09-06: 07:00:00 via INTRAVENOUS

## 2015-09-06 MED ORDER — SODIUM CHLORIDE 0.9 % IR SOLN
80.0000 mg | Status: AC
  Administered 2015-09-06 (×2): 80 mg
  Filled 2015-09-06: qty 2

## 2015-09-06 MED ORDER — MUPIROCIN 2 % EX OINT
1.0000 "application " | TOPICAL_OINTMENT | Freq: Once | CUTANEOUS | Status: AC
  Administered 2015-09-06: 1 via TOPICAL

## 2015-09-06 MED ORDER — MIDAZOLAM HCL 5 MG/5ML IJ SOLN
INTRAMUSCULAR | Status: DC | PRN
  Administered 2015-09-06: 2 mg via INTRAVENOUS
  Administered 2015-09-06 (×2): 1 mg via INTRAVENOUS
  Administered 2015-09-06 (×3): 2 mg via INTRAVENOUS
  Administered 2015-09-06 (×2): 1 mg via INTRAVENOUS
  Administered 2015-09-06: 2 mg via INTRAVENOUS

## 2015-09-06 MED ORDER — FENTANYL CITRATE (PF) 100 MCG/2ML IJ SOLN
INTRAMUSCULAR | Status: AC
  Filled 2015-09-06: qty 2

## 2015-09-06 MED ORDER — ONDANSETRON HCL 4 MG/2ML IJ SOLN
INTRAMUSCULAR | Status: DC | PRN
  Administered 2015-09-06 (×2): 2 mg via INTRAVENOUS

## 2015-09-06 MED ORDER — CHLORHEXIDINE GLUCONATE 4 % EX LIQD
60.0000 mL | Freq: Once | CUTANEOUS | Status: DC
  Filled 2015-09-06: qty 60

## 2015-09-06 MED ORDER — VANCOMYCIN HCL IN DEXTROSE 1-5 GM/200ML-% IV SOLN
1000.0000 mg | INTRAVENOUS | Status: AC
  Administered 2015-09-06: 1000 mg via INTRAVENOUS
  Filled 2015-09-06: qty 200

## 2015-09-06 MED ORDER — ONDANSETRON HCL 4 MG/2ML IJ SOLN
INTRAMUSCULAR | Status: AC
  Filled 2015-09-06: qty 2

## 2015-09-06 SURGICAL SUPPLY — 5 items
CABLE SURGICAL S-101-97-12 (CABLE) ×2 IMPLANT
ICD VISIA AF VR DVAB1D1 (ICD Generator) IMPLANT
PAD DEFIB LIFELINK (PAD) ×2 IMPLANT
TRAY PACEMAKER INSERTION (PACKS) ×2 IMPLANT
VISIA AF VR DVAB1D1 (ICD Generator) ×3 IMPLANT

## 2015-09-06 NOTE — H&P (Signed)
Electrophysiology Office Note Date: 08/16/2015  ID: [name] [name], DOB 06/07/1961, MRN [id number]  PCP: [name] BlamerHARRIS, WILLIAM, MD Primary Cardiologist: Katrinka BlazingSmith Electrophysiologist: Ladona Ridgelaylor  CC: discuss ICD generator change  [name] [name] is a 55 y.o. male seen today for Dr Ladona Ridgelaylor. His ICD reached ERI earlier this fall and he preferred to wait until now to have generator change. He presents today for evaluation for ICD generator change. Since last being seen in our clinic, the patient reports doing very well. He denies chest pain, palpitations, dyspnea, PND, orthopnea, nausea, vomiting, dizziness, syncope, edema, weight gain, or early satiety. He has not had ICD shocks.   Echo 08/2014 demonstrated EF 25-30%, akinesis of anteroseptal and apical myocardium, mild MR, mild AR.   He remains very active and has no functional limitations. He is able to push mow his lawn and work on the elliptical for >30 minutes without chest pain or shortness of breath. He has no HF symptoms.   Device History: MDT single chamber ICD implanted 2007 for ICM History of appropriate therapy: no History of AAD therapy: no   Past Medical History  Diagnosis Date  . CAD (coronary artery disease)     Anterior MI with emergent PCI followed by emergent CABG x 3 in April of 2007 per Dr. Dorris FetchHendrickson with SVG to LAD and SVG to ramus & OM1  . MI (myocardial infarction) (HCC) 11/2005  . Hyperlipidemia   . ICD (implantable cardioverter-defibrillator) in place   . Chronic systolic heart failure [facility](HCC)    Past Surgical History  Procedure Laterality Date  . Cardiac catheterization  2004, 2007  . Cardiac defibrillator placement  04/2006    [employer] [facility]-Dr. Amil AmenEdmunds  . Vasectomy    . Cholecystectomy    . Coronary artery bypass graft  11/2005  . Carotid stent insertion      Current Outpatient Prescriptions  Medication Sig Dispense Refill  . aspirin 325 MG  EC tablet Take 325 mg by mouth daily.    . carvedilol (COREG) 12.5 MG tablet Take 1 tablet (12.5 mg total) by mouth 2 (two) times daily. 180 tablet 2  . clopidogrel (PLAVIX) 75 MG tablet Take 1 tablet (75 mg total) by mouth daily. 30 tablet 5  . CRESTOR 40 MG tablet Take 20 mg by mouth daily.  5  . diphenoxylate-atropine (LOMOTIL) 2.5-0.025 MG per tablet Take 1 tablet by mouth 4 (four) times daily as needed for diarrhea or loose stools.     . fish oil-omega-3 fatty acids 1000 MG capsule Take 1 g by mouth daily.     Marland Kitchen. levothyroxine (SYNTHROID, LEVOTHROID) 25 MCG tablet Take 25 mcg by mouth daily.    Marland Kitchen. lisinopril (PRINIVIL,ZESTRIL) 20 MG tablet TAKE ONE TABLET BY MOUTH DAILY 90 tablet 2  . Melatonin 5 MG TABS Take 5 mg by mouth at bedtime.     . mirtazapine (REMERON) 45 MG tablet Take 45 mg by mouth at bedtime.    . nitroGLYCERIN (NITROSTAT) 0.4 MG SL tablet Place 1 tablet (0.4 mg total) under the tongue every 5 (five) minutes as needed. 25 tablet 3  . promethazine (PHENERGAN) 25 MG tablet Take 25 mg by mouth every 6 (six) hours as needed for nausea or vomiting.     No current facility-administered medications for this visit.    Allergies: Adhesive; Eggs or egg-derived products; and Penicillins   Social History: Social History   Social History  . Marital Status: Married    Spouse Name: N/A  . Number of Children:  N/A  . Years of Education: N/A   Occupational History  . Not on file.   Social History Main Topics  . Smoking status: Never Smoker   . Smokeless tobacco: Never Used  . Alcohol Use: No  . Drug Use: No  . Sexual Activity: Not on file   Other Topics Concern  . Not on file   Social History Narrative    Family History: Family History  Problem Relation Age of Onset  . Heart attack Father     Review of Systems: All other systems reviewed and are  otherwise negative except as noted above.   Physical Exam: VS: BP 112/80 mmHg  Pulse 58  Ht 6' (1.829 m)  Wt 214 lb 9.6 oz (97.342 kg)  BMI 29.10 kg/m2 , BMI Body mass index is 29.1 kg/(m^2).  GEN- The patient is well appearing, alert and oriented x 3 today.  HEENT: normocephalic, atraumatic; sclera clear, conjunctiva pink; hearing intact; oropharynx clear; neck supple, no JVP Lymph- no cervical lymphadenopathy Lungs- Clear to ausculation bilaterally, normal work of breathing. No wheezes, rales, rhonchi Heart- Regular rate and rhythm, no murmurs, rubs or gallops GI- soft, non-tender, non-distended, bowel sounds present Extremities- no clubbing, cyanosis, or edema; DP/PT/radial pulses 2+ bilaterally MS- no significant deformity or atrophy Skin- warm and dry, no rash or lesion; ICD pocket well healed Psych- euthymic mood, full affect Neuro- strength and sensation are intact  ICD interrogation- reviewed in detail today, See PACEART report  EKG: EKG is ordered today. The ekg ordered today shows sinus rhythm, IVCD QRS  Recent Labs: 09/05/2014: BUN 16; Creatinine, Ser 1.2; Hemoglobin 15.9; Platelets 188.0; Potassium 4.1; Sodium 141; TSH 1.59   Wt Readings from Last 3 Encounters:  08/16/15 214 lb 9.6 oz (97.342 kg)  05/12/15 213 lb 12.8 oz (96.979 kg)  05/12/15 213 lb 12.8 oz (96.979 kg)     Other studies Reviewed: Additional studies/ records that were reviewed today include: Dr Lubertha Basque notes, echo  Assessment and Plan:  1. Chronic systolic dysfunction euvolemic today Stable on an appropriate medical regimen ICD at [facility]. Risks, benefits to generator change discussed with patient today who would like to proceed. He has IVCD with QRS of >17msec but no HF symptoms. Will schedule single chamber ICD gen change with Dr [name] at next available time.  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   2. ICM/CAD No recent ischemic symptoms Consider decreasing ASA to 81mg  daily, will defer to Dr Katrinka Blazing Continue medical therapy     Current medicines are reviewed at length with the patient today.  The patient does not have concerns regarding his medicines. The following changes were made today: none  Labs/ tests ordered today include: none   Disposition: Follow up with Dr [name] after generator change, Dr Katrinka Blazing as scheduled         EP Attending  Patient seen and examined. Agree with above. Ok to proceed with ICD gen change.   Leonia Reeves.D.

## 2015-09-06 NOTE — Discharge Instructions (Signed)

## 2015-09-06 NOTE — H&P (Signed)
  ICD Criteria  Current LVEF:25%. Within 12 months prior to implant: Yes   Heart failure history: Yes, Class I  Cardiomyopathy history: Yes, Ischemic Cardiomyopathy.  Atrial Fibrillation/Atrial Flutter: No.  Ventricular tachycardia history: No.  Cardiac arrest history: No.  History of syndromes with risk of sudden death: No.  Previous ICD: Yes, Reason for ICD:  Primary prevention.  Current ICD indication: Primary  PPM indication: No.   Class I or II Bradycardia indication present: No  Beta Blocker therapy for 3 or more months: Yes, prescribed.   Ace Inhibitor/ARB therapy for 3 or more months: Yes, prescribed.

## 2015-09-07 ENCOUNTER — Telehealth: Payer: Self-pay | Admitting: Internal Medicine

## 2015-09-07 NOTE — Telephone Encounter (Signed)
Okay to travel.  Patient is aware.  He is going to see grandson but not driving.

## 2015-09-07 NOTE — Telephone Encounter (Signed)
New message      Pt had a defibulator replaced yesterday.  Will it be ok to take a 2hr ride to [place]?  Leave sat and come back Sunday. He will not be driving.

## 2015-09-11 ENCOUNTER — Telehealth: Payer: Self-pay | Admitting: Internal Medicine

## 2015-09-11 NOTE — Telephone Encounter (Signed)
Pt has questions re when he can remove bandage and when he can take a shower had new defib put in on 09-06-15 pls call [phone]

## 2015-09-11 NOTE — Telephone Encounter (Signed)
Returned patient's call.  Patient removed occlusive dressing while on the phone, noted no drainage on gauze and noted that site was closed with skin glue.  Advised patient that he may take short showers beginning today, but to be gentle washing around wound site.  Reviewed signs/symptoms of infection with patient, and he is aware to call with redness, swelling, tenderness, drainage, or fever/chills.  Patient is appreciative of call and denies additional questions or concerns at this time.

## 2015-09-11 NOTE — Telephone Encounter (Signed)
Will forward to device clinic  

## 2015-09-18 ENCOUNTER — Encounter: Payer: Self-pay | Admitting: Internal Medicine

## 2015-09-18 ENCOUNTER — Ambulatory Visit (INDEPENDENT_AMBULATORY_CARE_PROVIDER_SITE_OTHER): Payer: 59 | Admitting: *Deleted

## 2015-09-18 DIAGNOSIS — I5022 Chronic systolic (congestive) heart failure: Secondary | ICD-10-CM

## 2015-09-18 DIAGNOSIS — I255 Ischemic cardiomyopathy: Secondary | ICD-10-CM | POA: Diagnosis not present

## 2015-09-18 LAB — CUP PACEART INCLINIC DEVICE CHECK
Battery Remaining Longevity: 136 mo
Battery Voltage: 3.1 V
Brady Statistic RV Percent Paced: 0.03 %
HIGH POWER IMPEDANCE MEASURED VALUE: 45 Ohm
HighPow Impedance: 61 Ohm
Implantable Lead Model: 6947
Lead Channel Impedance Value: 513 Ohm
Lead Channel Impedance Value: 589 Ohm
Lead Channel Setting Pacing Amplitude: 2.5 V
Lead Channel Setting Pacing Pulse Width: 0.4 ms
Lead Channel Setting Sensing Sensitivity: 0.3 mV
MDC IDC LEAD IMPLANT DT: 20070921
MDC IDC LEAD LOCATION: 753860
MDC IDC MSMT LEADCHNL RV PACING THRESHOLD AMPLITUDE: 0.375 V
MDC IDC MSMT LEADCHNL RV PACING THRESHOLD PULSEWIDTH: 0.4 ms
MDC IDC MSMT LEADCHNL RV SENSING INTR AMPL: 26.125 mV
MDC IDC MSMT LEADCHNL RV SENSING INTR AMPL: 28 mV
MDC IDC SESS DTM: 20170123173117

## 2015-09-18 NOTE — Progress Notes (Signed)
Wound check appointment. Dermabond removed. Wound without redness or edema. Incision edges approximated, wound well healed. Normal device function. Threshold, sensing, and impedances consistent with implant measurements. Device programmed at appropriate safety margins. Histogram distribution appropriate for patient and level of activity. No AF episodes or ventricular arrhythmias noted. Patient educated about wound care, arm mobility, and shock plan. ROV in 3 months with GT.

## 2015-09-21 ENCOUNTER — Ambulatory Visit (HOSPITAL_COMMUNITY)
Admission: RE | Admit: 2015-09-21 | Discharge: 2015-09-21 | Disposition: A | Discharge status: Home / Self Care | Payer: 59 | Source: Ambulatory Visit | Attending: Internal Medicine | Admitting: Internal Medicine

## 2015-09-21 DIAGNOSIS — E785 Hyperlipidemia, unspecified: Secondary | ICD-10-CM | POA: Diagnosis not present

## 2015-09-21 DIAGNOSIS — R0989 Other specified symptoms and signs involving the circulatory and respiratory systems: Secondary | ICD-10-CM | POA: Diagnosis not present

## 2015-09-27 ENCOUNTER — Encounter (HOSPITAL_COMMUNITY): Payer: 59

## 2015-09-28 ENCOUNTER — Ambulatory Visit: Payer: 59

## 2015-10-11 ENCOUNTER — Telehealth (HOSPITAL_COMMUNITY): Payer: Self-pay | Admitting: *Deleted

## 2015-10-11 NOTE — Telephone Encounter (Signed)
Left message on voicemail in reference to upcoming appointment scheduled for 20/20/17. Phone number given for a call back so details instructions can be given. Chade Pitner, Adelene Idler

## 2015-10-16 ENCOUNTER — Ambulatory Visit (HOSPITAL_COMMUNITY): Payer: 59 | Attending: Cardiovascular Disease

## 2015-10-16 ENCOUNTER — Encounter (HOSPITAL_COMMUNITY): Payer: Self-pay

## 2015-10-16 DIAGNOSIS — I779 Disorder of arteries and arterioles, unspecified: Secondary | ICD-10-CM | POA: Diagnosis not present

## 2015-10-16 DIAGNOSIS — R9439 Abnormal result of other cardiovascular function study: Secondary | ICD-10-CM | POA: Diagnosis not present

## 2015-10-16 DIAGNOSIS — I25810 Atherosclerosis of coronary artery bypass graft(s) without angina pectoris: Secondary | ICD-10-CM | POA: Diagnosis not present

## 2015-10-16 DIAGNOSIS — I517 Cardiomegaly: Secondary | ICD-10-CM | POA: Insufficient documentation

## 2015-10-16 DIAGNOSIS — Z8249 Family history of ischemic heart disease and other diseases of the circulatory system: Secondary | ICD-10-CM | POA: Insufficient documentation

## 2015-10-16 LAB — MYOCARDIAL PERFUSION IMAGING
CHL CUP NUCLEAR SDS: 2
CHL CUP RESTING HR STRESS: 46 {beats}/min
LHR: 0.3
LV dias vol: 312 mL
LV sys vol: 226 mL
Peak HR: 80 {beats}/min
SRS: 18
SSS: 20
TID: 1.11

## 2015-10-16 MED ORDER — TECHNETIUM TC 99M SESTAMIBI GENERIC - CARDIOLITE
10.3000 | Freq: Once | INTRAVENOUS | Status: AC | PRN
  Administered 2015-10-16: 10 via INTRAVENOUS

## 2015-10-16 MED ORDER — REGADENOSON 0.4 MG/5ML IV SOLN
0.4000 mg | Freq: Once | INTRAVENOUS | Status: AC
  Administered 2015-10-16: 0.4 mg via INTRAVENOUS

## 2015-10-16 MED ORDER — PERFLUTREN LIPID MICROSPHERE: 1.0000 mL | INTRAVENOUS | Status: AC | PRN

## 2015-10-16 MED ORDER — TECHNETIUM TC 99M SESTAMIBI GENERIC - CARDIOLITE
33.0000 | Freq: Once | INTRAVENOUS | Status: AC | PRN
  Administered 2015-10-16: 33 via INTRAVENOUS

## 2015-10-19 ENCOUNTER — Other Ambulatory Visit: Payer: Self-pay

## 2015-10-19 DIAGNOSIS — G4719 Other hypersomnia: Secondary | ICD-10-CM

## 2015-10-19 DIAGNOSIS — R0683 Snoring: Secondary | ICD-10-CM

## 2015-10-20 ENCOUNTER — Other Ambulatory Visit: Payer: Self-pay | Admitting: Interventional Cardiology

## 2015-10-26 ENCOUNTER — Telehealth: Payer: Self-pay | Admitting: *Deleted

## 2015-10-26 ENCOUNTER — Encounter: Payer: Self-pay | Admitting: *Deleted

## 2015-10-26 NOTE — Telephone Encounter (Signed)
[name] [name] from [facility] called to let me know that the lab sleep study has been denied and they are suggesting home sleep study.    Ordering provider can do a Peer to Peer review between today and the next 14 days by calling [phone] Ref # [id number].   Forwarding to ordering provider for approval  For a home sleep study.

## 2015-12-12 ENCOUNTER — Encounter: Payer: Self-pay | Admitting: Internal Medicine

## 2015-12-12 ENCOUNTER — Ambulatory Visit (INDEPENDENT_AMBULATORY_CARE_PROVIDER_SITE_OTHER): Payer: 59 | Admitting: Internal Medicine

## 2015-12-12 VITALS — BP 110/58 | HR 59 | Ht 72.0 in | Wt 215.2 lb

## 2015-12-12 DIAGNOSIS — I5022 Chronic systolic (congestive) heart failure: Secondary | ICD-10-CM

## 2015-12-12 MED ORDER — APIXABAN 5 MG PO TABS: 5.0000 mg | ORAL_TABLET | Freq: Two times a day (BID) | ORAL | Status: DC

## 2015-12-12 MED ORDER — ASPIRIN 81 MG PO TBEC: 81.0000 mg | DELAYED_RELEASE_TABLET | Freq: Every day | ORAL | Status: AC

## 2015-12-12 NOTE — Progress Notes (Signed)
Electrophysiology Office Note Date: 12/12/2015  ID:  Yuvin, Bussiere [birth year], MRN [id number]  PCP: [name], MD Primary Cardiologist: Katrinka Blazing Electrophysiologist: Floyce Stakes [name] [name] is a 55 y.o. male seen today for ongoing evaluation of his ICD.  He is s/p ICD gen change several months ago  He presents today for evaluation for ICD generator change. Since last being seen in our clinic, the patient reports doing very well. He denies chest pain, palpitations, dyspnea, PND, orthopnea, nausea, vomiting, dizziness, syncope, edema, weight gain, or early satiety.  He has not had ICD shocks.   Echo 08/2014 demonstrated EF 25-30%, akinesis of anteroseptal and apical myocardium, mild MR, mild AR.   He remains very active and has no functional limitations. He is able to push mow his lawn and work on the elliptical for >30 minutes without chest pain or shortness of breath. He has no HF symptoms.   Device History: MDT single chamber ICD implanted 2007 for ICM with gen change and pocket rev. 08/2009 History of appropriate therapy: no History of AAD therapy: no   Past Medical History  Diagnosis Date  . CAD (coronary artery disease)     Anterior MI with emergent PCI followed by emergent CABG x 3 in April of 2007 per Dr. Dorris Fetch with SVG to LAD and SVG to ramus & OM1  . MI (myocardial infarction) (HCC) 11/2005  . Hyperlipidemia   . ICD (implantable cardioverter-defibrillator) in place   . Chronic systolic heart failure [facility])    Past Surgical History  Procedure Laterality Date  . Cardiac catheterization  2004, 2007  . Cardiac defibrillator placement  04/2006    [employer] [facility]-Dr. Amil Amen  . Vasectomy    . Cholecystectomy    . Coronary artery bypass graft  11/2005  . Carotid stent insertion    . Ep implantable device N/A 09/06/2015    Procedure: ICD Generator Changeout;  Surgeon: Marinus Maw, MD;  Location: [facility] INVASIVE CV LAB;  Service: Cardiovascular;   Laterality: N/A;    Current Outpatient Prescriptions  Medication Sig Dispense Refill  . aspirin 325 MG EC tablet Take 325 mg by mouth daily.    . carvedilol (COREG) 12.5 MG tablet Take 1 tablet (12.5 mg total) by mouth 2 (two) times daily. 180 tablet 1  . clopidogrel (PLAVIX) 75 MG tablet TAKE 1 TABLET (75 MG TOTAL) BY MOUTH DAILY. 30 tablet 11  . CRESTOR 40 MG tablet Take 20 mg by mouth daily.  5  . diphenoxylate-atropine (LOMOTIL) 2.5-0.025 MG per tablet Take 1 tablet by mouth 4 (four) times daily as needed for diarrhea or loose stools.     . fish oil-omega-3 fatty acids 1000 MG capsule Take 1 g by mouth daily.     Marland Kitchen levothyroxine (SYNTHROID, LEVOTHROID) 25 MCG tablet Take 25 mcg by mouth daily.    Marland Kitchen lisinopril (PRINIVIL,ZESTRIL) 20 MG tablet TAKE ONE TABLET BY MOUTH DAILY 90 tablet 2  . Melatonin 5 MG TABS Take 5 mg by mouth at bedtime.     . mirtazapine (REMERON) 30 MG tablet Take 30 mg by mouth at bedtime.    . nitroGLYCERIN (NITROSTAT) 0.4 MG SL tablet Place 1 tablet (0.4 mg total) under the tongue every 5 (five) minutes as needed. 25 tablet 3  . promethazine (PHENERGAN) 25 MG tablet Take 25 mg by mouth every 6 (six) hours as needed for nausea or vomiting.     No current facility-administered medications for this visit.  Allergies:   Adhesive; Eggs or egg-derived products; and Penicillins   Social History: Social History   Social History  . Marital Status: Married    Spouse Name: N/A  . Number of Children: N/A  . Years of Education: N/A   Occupational History  . Not on file.   Social History Main Topics  . Smoking status: Never Smoker   . Smokeless tobacco: Never Used  . Alcohol Use: No  . Drug Use: No  . Sexual Activity: Not on file   Other Topics Concern  . Not on file   Social History Narrative    Family History: Family History  Problem Relation Age of Onset  . Heart attack Father     Review of Systems: All other systems reviewed and are otherwise  negative except as noted above.   Physical Exam: VS:  BP 110/58 mmHg  Pulse 59  Ht 6' (1.829 m)  Wt 215 lb 3.2 oz (97.614 kg)  BMI 29.18 kg/m2 , BMI Body mass index is 29.18 kg/(m^2).  GEN- The patient is well appearing, alert and oriented x 3 today.   HEENT: normocephalic, atraumatic; sclera clear, conjunctiva pink; hearing intact; oropharynx clear; neck supple, no JVP Lymph- no cervical lymphadenopathy Lungs- Clear to ausculation bilaterally, normal work of breathing.  No wheezes, rales, rhonchi Heart- Regular rate and rhythm, no murmurs, rubs or gallops GI- soft, non-tender, non-distended, bowel sounds present Extremities- no clubbing, cyanosis, or edema; DP/PT/radial pulses 2+ bilaterally MS- no significant deformity or atrophy Skin- warm and dry, no rash or lesion; ICD pocket well healed Psych- euthymic mood, full affect Neuro- strength and sensation are intact  ICD interrogation- reviewed in detail today,  See PACEART report Atrial fib is present  Recent Labs: 09/06/2015: BUN 13; Creatinine, Ser 1.26*; Hemoglobin 16.1; Platelets 168; Potassium 4.1; Sodium 139   Wt Readings from Last 3 Encounters:  12/12/15 215 lb 3.2 oz (97.614 kg)  10/16/15 214 lb (97.07 kg)  09/06/15 206 lb (93.441 kg)     Other studies Reviewed: Additional studies/ records that were reviewed today include: Dr Lubertha Basqueaylor's notes, echo  Assessment and Plan:  1.  Chronic systolic dysfunction euvolemic today Stable on an appropriate medical regimen A low sodium diet is requested  2.  ICM/CAD No recent ischemic symptoms Consider decreasing ASA to 81mg  daily, discussed with Dr Katrinka BlazingSmith Continue medical therapy   3. Atrial fib - he is having atrial fib. Will start Eliquis and stop plavix. Discussed with Dr. Katrinka BlazingSmith.  4. HTN - his blood pressure is well controlled. No change in meds.  Leonia ReevesGregg Taylor,M.D.

## 2015-12-12 NOTE — Patient Instructions (Addendum)
Medication Instructions:  Your physician has recommended you make the following change in your medication:  1) Stop Plavix 2) Start Eliquis 5 mg twice daily 3) Decrease Aspirin to 81 mg daily    Labwork: None ordered   Testing/Procedures: None ordered   Follow-Up: Your physician wants you to follow-up in: 9 months with Dr Court Joyaylor You will receive a reminder letter in the mail two months in advance. If you don't receive a letter, please call our office to schedule the follow-up appointment.   Remote monitoring is used to monitor your ICD from home. This monitoring reduces the number of office visits required to check your device to one time per year. It allows us to keep an eye on the functioning of your device to ensure it is working properly. You are scheduled for a device check from home on 03/12/16. You may send your transmission at any time that day. If you have a wireless device, the transmission will be sent automatically. After your physician reviews your transmission, you will receive a postcard with your next transmission date.    Any Other Special Instructions Will Be Listed Below (If Applicable).     If you need a refill on your cardiac medications before your next appointment, please call your pharmacy.

## 2015-12-26 ENCOUNTER — Other Ambulatory Visit: Payer: Self-pay | Admitting: Interventional Cardiology

## 2016-03-11 IMAGING — NM NM MISC PROCEDURE
3 series · 18 of 18 positions shown · non-contrast
Comparison: none

[Series 1: wbr_r-proj_st rest_(id)_sa · 6.5mm · 6.51mm/px · 6 of 64 frames shown]
[frame 6/64]
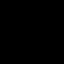
[frame 16/64]
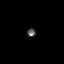
[frame 27/64]
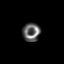
[frame 38/64]
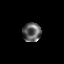
[frame 48/64]
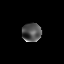
[frame 59/64]
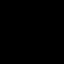

[Series 1: wbr_s-proj_st stress_(id)_sa · 6.5mm · 6.51mm/px · 6 of 512 frames shown (1 of 2)]
[frame 43/512]
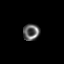
[frame 128/512]
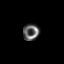
[frame 214/512]
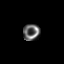
[frame 299/512]
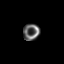
[frame 384/512]
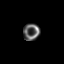
[frame 470/512]
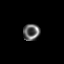

[Series 1: wbr_s-proj_st stress_(id)_sa · 6.5mm · 6.51mm/px · 6 of 64 frames shown (2 of 2)]
[frame 6/64]
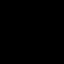
[frame 16/64]
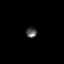
[frame 27/64]
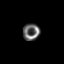
[frame 38/64]
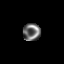
[frame 48/64]
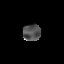
[frame 59/64]
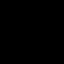

[18 of 18 positions shown; findings below may reference images not displayed]

Canned report from images found in remote index.

Refer to host system for actual result text.

## 2016-03-12 ENCOUNTER — Ambulatory Visit (INDEPENDENT_AMBULATORY_CARE_PROVIDER_SITE_OTHER): Payer: 59 | Admitting: *Deleted

## 2016-03-12 DIAGNOSIS — I255 Ischemic cardiomyopathy: Secondary | ICD-10-CM

## 2016-03-12 NOTE — Progress Notes (Signed)
Remote ICD transmission.   

## 2016-03-14 ENCOUNTER — Encounter: Payer: Self-pay | Admitting: Cardiology

## 2016-03-19 ENCOUNTER — Other Ambulatory Visit: Payer: Self-pay | Admitting: Internal Medicine

## 2016-03-19 MED ORDER — NITROGLYCERIN 0.4 MG SL SUBL: 0.4000 mg | SUBLINGUAL_TABLET | SUBLINGUAL | 3 refills | Status: DC | PRN

## 2016-03-21 ENCOUNTER — Encounter: Payer: Self-pay | Admitting: Interventional Cardiology

## 2016-03-25 LAB — CUP PACEART REMOTE DEVICE CHECK
Battery Remaining Longevity: 134 mo
Brady Statistic RV Percent Paced: 0.04 %
HIGH POWER IMPEDANCE MEASURED VALUE: 44 Ohm
HighPow Impedance: 58 Ohm
Implantable Lead Model: 6947
Lead Channel Impedance Value: 456 Ohm
Lead Channel Sensing Intrinsic Amplitude: 22.5 mV
Lead Channel Setting Pacing Amplitude: 2.5 V
Lead Channel Setting Pacing Pulse Width: 0.4 ms
Lead Channel Setting Sensing Sensitivity: 0.3 mV
MDC IDC LEAD IMPLANT DT: 20070921
MDC IDC LEAD LOCATION: 753860
MDC IDC MSMT BATTERY VOLTAGE: 3.08 V
MDC IDC MSMT LEADCHNL RV IMPEDANCE VALUE: 513 Ohm
MDC IDC MSMT LEADCHNL RV PACING THRESHOLD AMPLITUDE: 0.5 V
MDC IDC MSMT LEADCHNL RV PACING THRESHOLD PULSEWIDTH: 0.4 ms
MDC IDC MSMT LEADCHNL RV SENSING INTR AMPL: 22.5 mV
MDC IDC SESS DTM: 20170718083724

## 2016-04-06 ENCOUNTER — Other Ambulatory Visit: Payer: Self-pay | Admitting: Interventional Cardiology

## 2016-05-01 ENCOUNTER — Encounter: Payer: Self-pay | Admitting: Interventional Cardiology

## 2016-05-02 ENCOUNTER — Telehealth: Payer: Self-pay

## 2016-05-02 NOTE — Telephone Encounter (Signed)
[facility] Patient is scheduled to have a coloncscopy on 05/21/16 with Dr.Hayes. Eliquis will need to be HELD 3 days prior to procedure.  [facility]  [facility] [phone]  Request forwarded to Dr.Smith for approval

## 2016-05-03 NOTE — Telephone Encounter (Signed)
It would be acceptable to hold Apixaban for 48-72 hours prior to colonoscopy. Aspirin is also being used and would be okay to hold if necessary. Please resume therapy as soon as possible post procedure.

## 2016-05-09 ENCOUNTER — Other Ambulatory Visit: Payer: Self-pay | Admitting: Interventional Cardiology

## 2016-05-10 NOTE — Telephone Encounter (Signed)
[facility]

## 2016-05-16 ENCOUNTER — Ambulatory Visit (INDEPENDENT_AMBULATORY_CARE_PROVIDER_SITE_OTHER): Payer: 59 | Admitting: Interventional Cardiology

## 2016-05-16 ENCOUNTER — Encounter: Payer: Self-pay | Admitting: Interventional Cardiology

## 2016-05-16 VITALS — BP 110/64 | HR 58 | Ht 72.5 in | Wt 214.8 lb

## 2016-05-16 DIAGNOSIS — Z95810 Presence of automatic (implantable) cardiac defibrillator: Secondary | ICD-10-CM

## 2016-05-16 DIAGNOSIS — I480 Paroxysmal atrial fibrillation: Secondary | ICD-10-CM

## 2016-05-16 DIAGNOSIS — I25709 Atherosclerosis of coronary artery bypass graft(s), unspecified, with unspecified angina pectoris: Secondary | ICD-10-CM | POA: Diagnosis not present

## 2016-05-16 DIAGNOSIS — E785 Hyperlipidemia, unspecified: Secondary | ICD-10-CM

## 2016-05-16 DIAGNOSIS — I5022 Chronic systolic (congestive) heart failure: Secondary | ICD-10-CM

## 2016-05-16 DIAGNOSIS — I255 Ischemic cardiomyopathy: Secondary | ICD-10-CM

## 2016-05-16 DIAGNOSIS — I4891 Unspecified atrial fibrillation: Secondary | ICD-10-CM | POA: Insufficient documentation

## 2016-05-16 NOTE — Progress Notes (Signed)
Cardiology Office Note    Date:  05/16/2016   ID:  Harrington, Jobe 11-24-1960, MRN [id number]  PCP:  [name], MD  Cardiologist: Lesleigh Noe, MD   Chief Complaint  Patient presents with  . Coronary Artery Disease  . Congestive Heart Failure    History of Present Illness:  [name] [name] is a 55 y.o. male follow-up of ostial LAD Taxus DES stent, subsequent acute stent thrombosis 2007, emergency coronary bypass grafting with saphenous vein grafts 2007, chronic systolic heart failure with EF 25-30% January 2016, hyperlipidemia, and AICD.  No anginal complaints. He denies dyspnea. No limitations in physical activity except hot muggy weather. No peripheral edema. He is on beta blocker and ACE inhibitor therapy   Past Medical History:  Diagnosis Date  . CAD (coronary artery disease)    Anterior MI with emergent PCI followed by emergent CABG x 3 in April of 2007 per Dr. Dorris Fetch with SVG to LAD and SVG to ramus & OM1  . Chronic systolic heart failure (HCC)   . Hyperlipidemia   . ICD (implantable cardioverter-defibrillator) in place   . MI (myocardial infarction) (HCC) 11/2005    Past Surgical History:  Procedure Laterality Date  . CARDIAC CATHETERIZATION  2004, 2007  . CARDIAC DEFIBRILLATOR PLACEMENT  04/2006   Medtronic Virtuoso-Dr. Amil Amen  . CAROTID STENT INSERTION    . CHOLECYSTECTOMY    . CORONARY ARTERY BYPASS GRAFT  11/2005  . EP IMPLANTABLE DEVICE N/A 09/06/2015   Procedure: ICD Generator Changeout;  Surgeon: Marinus Maw, MD;  Location: [facility] INVASIVE CV LAB;  Service: Cardiovascular;  Laterality: N/A;  . VASECTOMY      Current Medications: Outpatient Medications Prior to Visit  Medication Sig Dispense Refill  . apixaban (ELIQUIS) 5 MG TABS tablet Take 1 tablet (5 mg total) by mouth 2 (two) times daily. 60 tablet 11  . aspirin 81 MG EC tablet Take 1 tablet (81 mg total) by mouth daily.    . carvedilol (COREG) 12.5 MG tablet Take 1 tablet (12.5  mg total) by mouth 2 (two) times daily with a meal. 180 tablet 3  . CRESTOR 40 MG tablet Take 20 mg by mouth daily.  5  . diphenoxylate-atropine (LOMOTIL) 2.5-0.025 MG per tablet Take 1 tablet by mouth 4 (four) times daily as needed for diarrhea or loose stools.     . fish oil-omega-3 fatty acids 1000 MG capsule Take 1 g by mouth daily.     [name] [name] Kitchen levothyroxine (SYNTHROID, LEVOTHROID) 25 MCG tablet Take 25 mcg by mouth daily.    [name] [name] Kitchen lisinopril (PRINIVIL,ZESTRIL) 20 MG tablet TAKE ONE TABLET BY MOUTH DAILY 90 tablet 2  . Melatonin 5 MG TABS Take 5 mg by mouth at bedtime.     . mirtazapine (REMERON) 30 MG tablet Take 30 mg by mouth at bedtime.    . nitroGLYCERIN (NITROSTAT) 0.4 MG SL tablet Place 1 tablet (0.4 mg total) under the tongue every 5 (five) minutes as needed. 25 tablet 3  . promethazine (PHENERGAN) 25 MG tablet Take 25 mg by mouth every 6 (six) hours as needed for nausea or vomiting.     No facility-administered medications prior to visit.      Allergies:   Adhesive [tape]; Eggs or egg-derived products; and Penicillins   Social History   Social History  . Marital status: Married    Spouse name: N/A  . Number of children: N/A  . Years of education: N/A   Social History  Main Topics  . Smoking status: Never Smoker  . Smokeless tobacco: Never Used  . Alcohol use No  . Drug use: No  . Sexual activity: Not Asked   Other Topics Concern  . None   Social History Narrative  . None     Family History:  The patient's family history includes Heart attack in his father.   ROS:   Please see the history of present illness.    None  All other systems reviewed and are negative.   PHYSICAL EXAM:   VS:  BP 110/64   Pulse (!) 58   Ht 6' 0.5" (1.842 m)   Wt 214 lb 12.8 oz (97.4 kg)   BMI 28.73 kg/m    GEN: Well nourished, well developed, in no acute distress  HEENT: normal  Neck: no JVD, carotid bruits, or masses Cardiac: RRR; no murmurs, rubs, or gallops,no edema  Respiratory:   clear to auscultation bilaterally, normal work of breathing GI: soft, nontender, nondistended, + BS MS: no deformity or atrophy  Skin: warm and dry, no rash Neuro:  Alert and Oriented x 3, Strength and sensation are intact Psych: euthymic mood, full affect  Wt Readings from Last 3 Encounters:  05/16/16 214 lb 12.8 oz (97.4 kg)  12/12/15 215 lb 3.2 oz (97.6 kg)  10/16/15 214 lb (97.1 kg)      Studies/Labs Reviewed:   EKG:  EKG  Sinus rhythm, incomplete left bundle branch block, but no evidence of pacing.  Recent Labs: 09/06/2015: BUN 13; Creatinine, Ser 1.26; Hemoglobin 16.1; Platelets 168; Potassium 4.1; Sodium 139   Lipid Panel No results found for: CHOL, TRIG, HDL, CHOLHDL, VLDL, LDLCALC, LDLDIRECT  Additional studies/ records that were reviewed today include:  January 2016 echocardiogram: Study Conclusions  - Left ventricle: The cavity size was moderately dilated. Wall thickness was normal. Systolic function was severely reduced. The estimated ejection fraction was in the range of 25% to 30%. There is akinesis of the anteroseptal and apical myocardium. - Aortic valve: There was mild regurgitation directed eccentrically in the LVOT and towards the mitral anterior leaflet. - Mitral valve: There was mild regurgitation.  Impressions:  - Compared to the prior study, there has been no significant interval change.  Nuclear stress test 10/16/15: Study Highlights   Addendum by Quintella Reichert, MD on Mon Oct 16, 2015 3:33 PM   Nuclear stress EF: 28%.  There is a medium defect of moderate severity present in the basal inferolateral, basal anterolateral, mid inferolateral and apical lateral location. The defect is non-reversible.There is a large defect of severe severity present in the basal anteroseptal, mid anterior, mid anteroseptal, apical anterior and apical septal location. The defect is non-reversible. There is a small defect of mild severity present in the  apical inferior and apex location. The defect is non-reversible.  This is a high risk study.  The left ventricular ejection fraction is severely decreased (<30%).  No ischemia. Fixed defects consistent with infarct.       ASSESSMENT:    1. Chronic systolic heart failure (HCC)   2. Coronary artery disease involving coronary bypass graft with unspecified angina pectoris   3. Hyperlipidemia   4. Automatic implantable cardioverter-defibrillator in situ      PLAN:  In order of problems listed above:  1. 2-D Doppler echocardiogram to reassess LV function. Last study done 18 months ago EF was 25%. If increasing LV cavity size or decreasing function will switch lisinopril to Entresto. Discussed this with the patient.  Otherwise follow-up in 9-12 months. 2. Stable without evidence of angina. No ischemia noted on nuclear study done in February 2017. 3. On statin therapy without complications. 4. Normal device function with recent power source change by Dr. Ladona Ridgelaylor.    Medication Adjustments/Labs and Tests Ordered: Current medicines are reviewed at length with the patient today.  Concerns regarding medicines are outlined above.  Medication changes, Labs and Tests ordered today are listed in the Patient Instructions below. There are no Patient Instructions on file for this visit.   Signed, Lesleigh NoeHenry W Smith III, MD  05/16/2016 11:17 AM    [facility] [address], [place], [place]  [id number] Phone: [phone]; Fax: [phone]

## 2016-05-16 NOTE — Patient Instructions (Signed)
Medication Instructions:  Your physician recommends that you continue on your current medications as directed. Please refer to the Current Medication list given to you today.    Labwork: None Ordered    Testing/Procedures: Your physician has requested that you have an echocardiogram. Echocardiography is a painless test that uses sound waves to create images of your heart. It provides your doctor with information about the size and shape of your heart and how well your heart's chambers and valves are working. This procedure takes approximately one hour. There are no restrictions for this procedure.    Follow-Up: Your physician wants you to follow-up in: 1 year with Dr. Katrinka BlazingSmith. You will receive a reminder letter in the mail two months in advance. If you don't receive a letter, please call our office to schedule the follow-up appointment.  Any Other Special Instructions Will Be Listed Below (If Applicable).     If you need a refill on your cardiac medications before your next appointment, please call your pharmacy.

## 2016-06-03 ENCOUNTER — Ambulatory Visit (HOSPITAL_COMMUNITY): Payer: 59 | Attending: Cardiovascular Disease

## 2016-06-03 ENCOUNTER — Other Ambulatory Visit: Payer: Self-pay

## 2016-06-03 DIAGNOSIS — I340 Nonrheumatic mitral (valve) insufficiency: Secondary | ICD-10-CM | POA: Diagnosis not present

## 2016-06-03 DIAGNOSIS — I255 Ischemic cardiomyopathy: Secondary | ICD-10-CM | POA: Diagnosis not present

## 2016-06-03 DIAGNOSIS — I2510 Atherosclerotic heart disease of native coronary artery without angina pectoris: Secondary | ICD-10-CM | POA: Diagnosis not present

## 2016-06-03 DIAGNOSIS — I517 Cardiomegaly: Secondary | ICD-10-CM | POA: Insufficient documentation

## 2016-06-03 DIAGNOSIS — I252 Old myocardial infarction: Secondary | ICD-10-CM | POA: Insufficient documentation

## 2016-06-03 DIAGNOSIS — I351 Nonrheumatic aortic (valve) insufficiency: Secondary | ICD-10-CM | POA: Insufficient documentation

## 2016-06-03 DIAGNOSIS — R29898 Other symptoms and signs involving the musculoskeletal system: Secondary | ICD-10-CM | POA: Insufficient documentation

## 2016-06-11 ENCOUNTER — Ambulatory Visit (INDEPENDENT_AMBULATORY_CARE_PROVIDER_SITE_OTHER): Payer: 59 | Admitting: *Deleted

## 2016-06-11 DIAGNOSIS — I255 Ischemic cardiomyopathy: Secondary | ICD-10-CM

## 2016-06-11 NOTE — Progress Notes (Signed)
Remote ICD transmission.   

## 2016-06-12 ENCOUNTER — Encounter: Payer: Self-pay | Admitting: Cardiology

## 2016-06-21 LAB — CUP PACEART REMOTE DEVICE CHECK
Battery Remaining Longevity: 132 mo
Battery Voltage: 3.06 V
HIGH POWER IMPEDANCE MEASURED VALUE: 55 Ohm
HighPow Impedance: 43 Ohm
Implantable Lead Location: 753860
Lead Channel Impedance Value: 513 Ohm
Lead Channel Sensing Intrinsic Amplitude: 24.875 mV
Lead Channel Sensing Intrinsic Amplitude: 24.875 mV
Lead Channel Setting Pacing Amplitude: 2.5 V
MDC IDC LEAD IMPLANT DT: 20070921
MDC IDC MSMT LEADCHNL RV IMPEDANCE VALUE: 456 Ohm
MDC IDC MSMT LEADCHNL RV PACING THRESHOLD AMPLITUDE: 0.375 V
MDC IDC MSMT LEADCHNL RV PACING THRESHOLD PULSEWIDTH: 0.4 ms
MDC IDC SESS DTM: 20171017083723
MDC IDC SET LEADCHNL RV PACING PULSEWIDTH: 0.4 ms
MDC IDC SET LEADCHNL RV SENSING SENSITIVITY: 0.3 mV
MDC IDC STAT BRADY RV PERCENT PACED: 0.15 %

## 2016-07-23 ENCOUNTER — Telehealth: Payer: Self-pay | Admitting: Interventional Cardiology

## 2016-07-23 NOTE — Telephone Encounter (Signed)
New message  Talked to physician, wants to prescrib Questan/cholesterol  Since gall bladder removed/pt goes multiple times a day/this med will slow down frequency  Please call and advise

## 2016-07-24 NOTE — Telephone Encounter (Signed)
This will be okay. 

## 2016-07-24 NOTE — Telephone Encounter (Signed)
Spoke with pt and he states that Dr. Tiburcio PeaHarris would like to put pt on Questran to help with pt having so many BM's in the mornings.  He does not want to stop any medications, just add the Questran.  Pt would like to know if Dr. Katrinka BlazingSmith feels that this will be ok?  Advised I will send message to Dr. Katrinka BlazingSmith for review.

## 2016-07-24 NOTE — Telephone Encounter (Signed)
Informed pt Dr. Katrinka BlazingSmith said this will be ok.  Pt verbalized understanding and was appreciative for assistance.

## 2016-09-25 ENCOUNTER — Encounter: Payer: Self-pay | Admitting: Internal Medicine

## 2016-09-25 ENCOUNTER — Ambulatory Visit (INDEPENDENT_AMBULATORY_CARE_PROVIDER_SITE_OTHER): Payer: 59 | Admitting: Internal Medicine

## 2016-09-25 VITALS — BP 132/74 | HR 57 | Ht 75.0 in | Wt 219.2 lb

## 2016-09-25 DIAGNOSIS — Z95810 Presence of automatic (implantable) cardiac defibrillator: Secondary | ICD-10-CM

## 2016-09-25 DIAGNOSIS — I5022 Chronic systolic (congestive) heart failure: Secondary | ICD-10-CM

## 2016-09-25 LAB — CUP PACEART INCLINIC DEVICE CHECK
Battery Remaining Longevity: 130 mo
Battery Voltage: 3.04 V
Brady Statistic RV Percent Paced: 0.07 %
HIGH POWER IMPEDANCE MEASURED VALUE: 60 Ohm
HighPow Impedance: 47 Ohm
Implantable Lead Implant Date: 20070921
Lead Channel Impedance Value: 475 Ohm
Lead Channel Sensing Intrinsic Amplitude: 26.375 mV
Lead Channel Setting Pacing Amplitude: 2.5 V
Lead Channel Setting Pacing Pulse Width: 0.4 ms
MDC IDC LEAD LOCATION: 753860
MDC IDC MSMT LEADCHNL RV IMPEDANCE VALUE: 551 Ohm
MDC IDC MSMT LEADCHNL RV PACING THRESHOLD AMPLITUDE: 0.5 V
MDC IDC MSMT LEADCHNL RV PACING THRESHOLD PULSEWIDTH: 0.4 ms
MDC IDC PG IMPLANT DT: 20170111
MDC IDC SESS DTM: 20180131161106
MDC IDC SET LEADCHNL RV SENSING SENSITIVITY: 0.3 mV

## 2016-09-25 MED ORDER — APIXABAN 5 MG PO TABS: 5.0000 mg | ORAL_TABLET | Freq: Two times a day (BID) | ORAL | 3 refills | Status: DC

## 2016-09-25 NOTE — Patient Instructions (Addendum)
Medication Instructions:  Your physician recommends that you continue on your current medications as directed. Please refer to the Current Medication list given to you today.   Labwork: None Ordered   Testing/Procedures: None Ordered   Follow-Up: Your physician wants you to follow-up in: 1 year with Dr. Taylor. You will receive a reminder letter in the mail two months in advance. If you don't receive a letter, please call our office to schedule the follow-up appointment.  Remote monitoring is used to monitor your ICD from home. This monitoring reduces the number of office visits required to check your device to one time per year. It allows us to keep an eye on the functioning of your device to ensure it is working properly. You are scheduled for a device check from home on 12/25/16. You may send your transmission at any time that day. If you have a wireless device, the transmission will be sent automatically. After your physician reviews your transmission, you will receive a postcard with your next transmission date.      Any Other Special Instructions Will Be Listed Below (If Applicable).     If you need a refill on your cardiac medications before your next appointment, please call your pharmacy.   

## 2016-09-25 NOTE — Progress Notes (Signed)
Electrophysiology Office Note Date: 09/25/2016  ID:  [name] [name], [name] [name] February 02, 1961, MRN [id number]  PCP: [name] Blamer, MD Primary Cardiologist: Katrinka Blazing Electrophysiologist: Floyce Stakes ABDISHAKUR [name] is a 56 y.o. male seen today for ongoing followup of his ICD.  He is s/p ICD gen change over a year ago  He presents today for evaluation for ICD generator change. Since last being seen in our clinic, the patient reports doing very well. He denies chest pain, palpitations, dyspnea, PND, orthopnea, nausea, vomiting, dizziness, syncope, edema, weight gain, or early satiety.  He has not had ICD shocks.   Echo 08/2014 demonstrated EF 25-30%, akinesis of anteroseptal and apical myocardium, mild MR, mild AR.   He remains very active and has no functional limitations. He is able to push mow his lawn and work on the elliptical for >30 minutes without chest pain or shortness of breath. He has no HF symptoms.   Device History: MDT single chamber ICD implanted 2007 for ICM with gen change and pocket rev. 08/2009 and again in Jan 2017 History of appropriate therapy: no History of AAD therapy: no   Past Medical History:  Diagnosis Date  . CAD (coronary artery disease)    Anterior MI with emergent PCI followed by emergent CABG x 3 in April of 2007 per Dr. Dorris Fetch with SVG to LAD and SVG to ramus & OM1  . Chronic systolic heart failure (HCC)   . Hyperlipidemia   . ICD (implantable cardioverter-defibrillator) in place   . MI (myocardial infarction) 11/2005   Past Surgical History:  Procedure Laterality Date  . CARDIAC CATHETERIZATION  2004, 2007  . CARDIAC DEFIBRILLATOR PLACEMENT  04/2006   Medtronic Virtuoso-Dr. Amil Amen  . CAROTID STENT INSERTION    . CHOLECYSTECTOMY    . CORONARY ARTERY BYPASS GRAFT  11/2005  . EP IMPLANTABLE DEVICE N/A 09/06/2015   Procedure: ICD Generator Changeout;  Surgeon: Marinus Maw, MD;  Location: [facility] INVASIVE CV LAB;  Service: Cardiovascular;  Laterality:  N/A;  . VASECTOMY      Current Outpatient Prescriptions  Medication Sig Dispense Refill  . apixaban (ELIQUIS) 5 MG TABS tablet Take 1 tablet (5 mg total) by mouth 2 (two) times daily. 60 tablet 11  . aspirin 81 MG EC tablet Take 1 tablet (81 mg total) by mouth daily.    . carvedilol (COREG) 12.5 MG tablet Take 1 tablet (12.5 mg total) by mouth 2 (two) times daily with a meal. 180 tablet 3  . CRESTOR 40 MG tablet Take 20 mg by mouth daily.  5  . diphenoxylate-atropine (LOMOTIL) 2.5-0.025 MG per tablet Take 1 tablet by mouth 4 (four) times daily as needed for diarrhea or loose stools.     . fish oil-omega-3 fatty acids 1000 MG capsule Take 1 g by mouth daily.     Marland Kitchen levothyroxine (SYNTHROID, LEVOTHROID) 25 MCG tablet Take 25 mcg by mouth daily.    Marland Kitchen lisinopril (PRINIVIL,ZESTRIL) 20 MG tablet TAKE ONE TABLET BY MOUTH DAILY 90 tablet 2  . Melatonin 5 MG TABS Take 5 mg by mouth at bedtime.     . mirtazapine (REMERON) 30 MG tablet Take 30 mg by mouth at bedtime.    . nitroGLYCERIN (NITROSTAT) 0.4 MG SL tablet Place 1 tablet (0.4 mg total) under the tongue every 5 (five) minutes as needed. 25 tablet 3  . promethazine (PHENERGAN) 25 MG tablet Take 25 mg by mouth every 6 (six) hours as needed for nausea or vomiting.  No current facility-administered medications for this visit.     Allergies:   Adhesive [tape]; Eggs or egg-derived products; and Penicillins   Social History: Social History   Social History  . Marital status: Married    Spouse name: N/A  . Number of children: N/A  . Years of education: N/A   Occupational History  . Not on file.   Social History Main Topics  . Smoking status: Never Smoker  . Smokeless tobacco: Never Used  . Alcohol use No  . Drug use: No  . Sexual activity: Not on file   Other Topics Concern  . Not on file   Social History Narrative  . No narrative on file    Family History: Family History  Problem Relation Age of Onset  . Heart attack  Father     Review of Systems: All other systems reviewed and are otherwise negative except as noted above.   Physical Exam: VS:  There were no vitals taken for this visit. , BMI There is no height or weight on file to calculate BMI.  GEN- The patient is well appearing, alert and oriented x 3 today.   HEENT: normocephalic, atraumatic; sclera clear, conjunctiva pink; hearing intact; oropharynx clear; neck supple, no JVP Lymph- no cervical lymphadenopathy Lungs- Clear to ausculation bilaterally, normal work of breathing.  No wheezes, rales, rhonchi Heart- Regular rate and rhythm, no murmurs, rubs or gallops GI- soft, non-tender, non-distended, bowel sounds present Extremities- no clubbing, cyanosis, or edema; DP/PT/radial pulses 2+ bilaterally MS- no significant deformity or atrophy Skin- warm and dry, no rash or lesion; ICD pocket well healed Psych- euthymic mood, full affect Neuro- strength and sensation are intact  ICD interrogation- reviewed in detail today,  See PACEART report Atrial fib is present  Recent Labs: No results found for requested labs within last 8760 hours.   Wt Readings from Last 3 Encounters:  05/16/16 214 lb 12.8 oz (97.4 kg)  12/12/15 215 lb 3.2 oz (97.6 kg)  10/16/15 214 lb (97.1 kg)     Other studies Reviewed: Additional studies/ records that were reviewed today include: Dr Lubertha Basqueaylor's notes, echo  Assessment and Plan:  1.  Chronic systolic dysfunction euvolemic today Stable on an appropriate medical regimen A low sodium diet is requested  2.  ICM/CAD No recent ischemic symptoms Consider decreasing ASA to 81mg  daily, discussed with Dr Katrinka BlazingSmith Continue medical therapy   3. Atrial fib - he has not had any atrial fib. Will continue Eliquis. We could consider stopping low dose ASA but I will defer to Dr. Katrinka BlazingSmith.  4. HTN - his blood pressure is well controlled. No change in meds.  5. ICD - his Medtronic device is working normally. Will recheck in  severla months.  Leonia ReevesGregg Adilenne Ashworth,M.D.

## 2016-12-24 ENCOUNTER — Other Ambulatory Visit: Payer: Self-pay | Admitting: Internal Medicine

## 2016-12-25 ENCOUNTER — Ambulatory Visit (INDEPENDENT_AMBULATORY_CARE_PROVIDER_SITE_OTHER): Payer: 59 | Admitting: *Deleted

## 2016-12-25 DIAGNOSIS — I255 Ischemic cardiomyopathy: Secondary | ICD-10-CM

## 2016-12-25 NOTE — Progress Notes (Signed)
Remote ICD transmission.   

## 2016-12-26 ENCOUNTER — Encounter: Payer: Self-pay | Admitting: Cardiology

## 2016-12-27 LAB — CUP PACEART REMOTE DEVICE CHECK
Battery Voltage: 3.03 V
Date Time Interrogation Session: 20180502092825
HighPow Impedance: 44 Ohm
HighPow Impedance: 55 Ohm
Implantable Lead Implant Date: 20070921
Implantable Lead Location: 753860
Implantable Pulse Generator Implant Date: 20170111
Lead Channel Pacing Threshold Pulse Width: 0.4 ms
Lead Channel Sensing Intrinsic Amplitude: 22.75 mV
MDC IDC MSMT BATTERY REMAINING LONGEVITY: 128 mo
MDC IDC MSMT LEADCHNL RV IMPEDANCE VALUE: 456 Ohm
MDC IDC MSMT LEADCHNL RV IMPEDANCE VALUE: 513 Ohm
MDC IDC MSMT LEADCHNL RV PACING THRESHOLD AMPLITUDE: 0.375 V
MDC IDC MSMT LEADCHNL RV SENSING INTR AMPL: 22.75 mV
MDC IDC SET LEADCHNL RV PACING AMPLITUDE: 2.5 V
MDC IDC SET LEADCHNL RV PACING PULSEWIDTH: 0.4 ms
MDC IDC SET LEADCHNL RV SENSING SENSITIVITY: 0.3 mV
MDC IDC STAT BRADY RV PERCENT PACED: 0.03 %

## 2017-01-23 DIAGNOSIS — M545 Low back pain: Secondary | ICD-10-CM | POA: Diagnosis not present

## 2017-03-21 DIAGNOSIS — L821 Other seborrheic keratosis: Secondary | ICD-10-CM | POA: Diagnosis not present

## 2017-03-21 DIAGNOSIS — Z0000 Encounter for general adult medical examination without abnormal findings: Secondary | ICD-10-CM | POA: Diagnosis not present

## 2017-03-21 DIAGNOSIS — E7800 Pure hypercholesterolemia, unspecified: Secondary | ICD-10-CM | POA: Diagnosis not present

## 2017-03-26 ENCOUNTER — Ambulatory Visit (INDEPENDENT_AMBULATORY_CARE_PROVIDER_SITE_OTHER): Payer: 59 | Admitting: *Deleted

## 2017-03-26 ENCOUNTER — Other Ambulatory Visit: Payer: Self-pay | Admitting: Internal Medicine

## 2017-03-26 DIAGNOSIS — I255 Ischemic cardiomyopathy: Secondary | ICD-10-CM

## 2017-03-26 NOTE — Progress Notes (Signed)
Remote ICD transmission.   

## 2017-03-27 ENCOUNTER — Encounter: Payer: Self-pay | Admitting: Cardiology

## 2017-03-28 LAB — CUP PACEART REMOTE DEVICE CHECK
Battery Voltage: 3.03 V
Brady Statistic RV Percent Paced: 0.11 %
Date Time Interrogation Session: 20180801073324
HIGH POWER IMPEDANCE MEASURED VALUE: 42 Ohm
HIGH POWER IMPEDANCE MEASURED VALUE: 53 Ohm
Lead Channel Impedance Value: 418 Ohm
Lead Channel Pacing Threshold Amplitude: 0.375 V
Lead Channel Sensing Intrinsic Amplitude: 21.625 mV
Lead Channel Sensing Intrinsic Amplitude: 21.625 mV
Lead Channel Setting Sensing Sensitivity: 0.3 mV
MDC IDC LEAD IMPLANT DT: 20070921
MDC IDC LEAD LOCATION: 753860
MDC IDC MSMT BATTERY REMAINING LONGEVITY: 126 mo
MDC IDC MSMT LEADCHNL RV IMPEDANCE VALUE: 513 Ohm
MDC IDC MSMT LEADCHNL RV PACING THRESHOLD PULSEWIDTH: 0.4 ms
MDC IDC PG IMPLANT DT: 20170111
MDC IDC SET LEADCHNL RV PACING AMPLITUDE: 2.5 V
MDC IDC SET LEADCHNL RV PACING PULSEWIDTH: 0.4 ms

## 2017-04-17 ENCOUNTER — Other Ambulatory Visit: Payer: Self-pay | Admitting: Internal Medicine

## 2017-04-21 ENCOUNTER — Other Ambulatory Visit: Payer: Self-pay | Admitting: Interventional Cardiology

## 2017-05-08 ENCOUNTER — Other Ambulatory Visit: Payer: Self-pay | Admitting: Internal Medicine

## 2017-05-09 NOTE — Telephone Encounter (Signed)
nitroGLYCERIN (NITROSTAT) 0.4 MG SL tablet  Medication  Date: 04/17/2017 Department: [facility] Office Ordering/Authorizing: Marinus Maw, MD  Order Providers   Prescribing Provider Encounter Provider  Marinus Maw, MD Marinus Maw, MD  Medication Detail    Disp Refills Start End   nitroGLYCERIN (NITROSTAT) 0.4 MG SL tablet 25 tablet 2 04/17/2017    Sig: PLACE 1 TABLET (0.4 MG TOTAL) UNDER THE TONGUE EVERY 5 (FIVE) MINUTES AS NEEDED AS DIRECTED   Sent to pharmacy as: nitroGLYCERIN (NITROSTAT) 0.4 MG SL tablet   Notes to Pharmacy: Please keep upcoming appointment for future refills. Thank you   E-Prescribing Status: Receipt confirmed by pharmacy (04/17/2017 11:52 AM EDT)   Pharmacy   Pt has refills on file. Refills not appropriate at this time.

## 2017-05-26 ENCOUNTER — Ambulatory Visit (INDEPENDENT_AMBULATORY_CARE_PROVIDER_SITE_OTHER): Payer: 59 | Admitting: Interventional Cardiology

## 2017-05-26 ENCOUNTER — Encounter: Payer: Self-pay | Admitting: Interventional Cardiology

## 2017-05-26 VITALS — BP 104/64 | HR 59 | Ht 72.0 in | Wt 218.4 lb

## 2017-05-26 DIAGNOSIS — Z95810 Presence of automatic (implantable) cardiac defibrillator: Secondary | ICD-10-CM

## 2017-05-26 DIAGNOSIS — I25810 Atherosclerosis of coronary artery bypass graft(s) without angina pectoris: Secondary | ICD-10-CM | POA: Diagnosis not present

## 2017-05-26 DIAGNOSIS — I480 Paroxysmal atrial fibrillation: Secondary | ICD-10-CM

## 2017-05-26 DIAGNOSIS — E7849 Other hyperlipidemia: Secondary | ICD-10-CM | POA: Diagnosis not present

## 2017-05-26 DIAGNOSIS — I5022 Chronic systolic (congestive) heart failure: Secondary | ICD-10-CM

## 2017-05-26 NOTE — Patient Instructions (Signed)
Medication Instructions:  1) Take the prescription given to you to your pharmacy and find out about the cost of the [place].  Please contact our office and let us know if this medication will be affordable for you.  DO NOT start this medication without contacting our office for instructions.    Labwork: None  Testing/Procedures: None  Follow-Up: Your physician wants you to follow-up in: 1 year with Dr. Katrinka Blazing.  You will receive a reminder letter in the mail two months in advance. If you don't receive a letter, please call our office to schedule the follow-up appointment.   Any Other Special Instructions Will Be Listed Below (If Applicable).     If you need a refill on your cardiac medications before your next appointment, please call your pharmacy.

## 2017-05-26 NOTE — Progress Notes (Signed)
Cardiology Office Note    Date:  05/26/2017   ID:  Draedyn, Weidinger Jan 02, 1961, MRN [id number]  PCP:  [name], MD  Cardiologist: Lesleigh Noe, MD   Chief Complaint  Patient presents with  . Congestive Heart Failure  . Coronary Artery Disease    History of Present Illness:  [name] [name] is a 56 y.o. male for follow-up of ostial LAD Taxus DES stent, subsequent acute stent thrombosis 2007, emergency coronary bypass grafting with saphenous vein grafts 2007, chronic systolic heart failure with EF 25-30% January 2016, hyperlipidemia, and AICD.  Doing well. Denies orthopnea, PND, chest pain, syncope, claudication, and exertional intolerance. No medication side effects. He denies peripheral edema. Has not had palpitations that are prolonged although when he lies on his left side there are palpitations.   Past Medical History:  Diagnosis Date  . CAD (coronary artery disease)    Anterior MI with emergent PCI followed by emergent CABG x 3 in April of 2007 per Dr. Dorris Fetch with SVG to LAD and SVG to ramus & OM1  . Chronic systolic heart failure (HCC)   . Hyperlipidemia   . ICD (implantable cardioverter-defibrillator) in place   . MI (myocardial infarction) (HCC) 11/2005    Past Surgical History:  Procedure Laterality Date  . CARDIAC CATHETERIZATION  2004, 2007  . CARDIAC DEFIBRILLATOR PLACEMENT  04/2006   Medtronic Virtuoso-Dr. Amil Amen  . CAROTID STENT INSERTION    . CHOLECYSTECTOMY    . CORONARY ARTERY BYPASS GRAFT  11/2005  . EP IMPLANTABLE DEVICE N/A 09/06/2015   Procedure: ICD Generator Changeout;  Surgeon: Marinus Maw, MD;  Location: [facility] INVASIVE CV LAB;  Service: Cardiovascular;  Laterality: N/A;  . VASECTOMY      Current Medications: Outpatient Medications Prior to Visit  Medication Sig Dispense Refill  . apixaban (ELIQUIS) 5 MG TABS tablet Take 1 tablet (5 mg total) by mouth 2 (two) times daily. 180 tablet 3  . aspirin 81 MG EC tablet Take 1  tablet (81 mg total) by mouth daily.    . carvedilol (COREG) 12.5 MG tablet TAKE 1 TABLET (12.5 MG TOTAL) BY MOUTH 2 (TWO) TIMES DAILY WITH A MEAL. 180 tablet 0  . CRESTOR 40 MG tablet Take 20 mg by mouth daily.  5  . diphenoxylate-atropine (LOMOTIL) 2.5-0.025 MG per tablet Take 1 tablet by mouth 4 (four) times daily as needed for diarrhea or loose stools.     . fish oil-omega-3 fatty acids 1000 MG capsule Take 1 g by mouth daily.     Marland Kitchen ketoconazole (NIZORAL) 2 % cream Apply 1 application topically as directed.    Marland Kitchen levothyroxine (SYNTHROID, LEVOTHROID) 25 MCG tablet Take 25 mcg by mouth daily.    Marland Kitchen lisinopril (PRINIVIL,ZESTRIL) 20 MG tablet TAKE 1 TABLET BY MOUTH EVERY DAY 90 tablet 0  . Melatonin 5 MG TABS Take 5 mg by mouth at bedtime.     . mirtazapine (REMERON) 30 MG tablet Take 30 mg by mouth at bedtime.    . nitroGLYCERIN (NITROSTAT) 0.4 MG SL tablet PLACE 1 TABLET (0.4 MG TOTAL) UNDER THE TONGUE EVERY 5 (FIVE) MINUTES AS NEEDED AS DIRECTED 25 tablet 2  . PREVALITE 4 GM/DOSE powder Take 1 scoop by mouth daily.    . promethazine (PHENERGAN) 25 MG tablet Take 25 mg by mouth every 6 (six) hours as needed for nausea or vomiting.     No facility-administered medications prior to visit.      Allergies:  Adhesive [tape]; Eggs or egg-derived products; and Penicillins   Social History   Social History  . Marital status: Married    Spouse name: N/A  . Number of children: N/A  . Years of education: N/A   Social History Main Topics  . Smoking status: Never Smoker  . Smokeless tobacco: Never Used  . Alcohol use No  . Drug use: No  . Sexual activity: Not Asked   Other Topics Concern  . None   Social History Narrative  . None     Family History:  The patient's family history includes Heart attack in his father.   ROS:   Please see the history of present illness.    Gaining weight. Denies neurological complaints. Difficulty with sleep.  All other systems reviewed and are  negative.   PHYSICAL EXAM:   VS:  BP 104/64 (BP Location: Left Arm)   Pulse (!) 59   Ht 6' (1.829 m)   Wt 218 lb 6.4 oz (99.1 kg)   BMI 29.62 kg/m    GEN: Well nourished, well developed, in no acute distress  HEENT: normal  Neck: no JVD, carotid bruits, or masses Cardiac: RRR; no murmurs, rubs, or gallops,no edema  Respiratory:  clear to auscultation bilaterally, normal work of breathing GI: soft, nontender, nondistended, + BS MS: no deformity or atrophy  Skin: warm and dry, no rash Neuro:  Alert and Oriented x 3, Strength and sensation are intact Psych: euthymic mood, full affect  Wt Readings from Last 3 Encounters:  05/26/17 218 lb 6.4 oz (99.1 kg)  09/25/16 219 lb 3.2 oz (99.4 kg)  05/16/16 214 lb 12.8 oz (97.4 kg)      Studies/Labs Reviewed:   EKG:  EKG  Sinus rhythm with first-degree AV block, left bundle branch block, and otherwise unremarkable.  Recent Labs: No results found for requested labs within last 8760 hours.   Lipid Panel No results found for: CHOL, TRIG, HDL, CHOLHDL, VLDL, LDLCALC, LDLDIRECT  Additional studies/ records that were reviewed today include:  Echocardiography 06/03/16: ------------------------------------------------------------------- Study Conclusions  - Left ventricle: The cavity size was severely dilated. Systolic   function was severely reduced. The estimated ejection fraction   was in the range of 25% to 30%. Diffuse hypokinesis. Akinesis of   the anteroseptal myocardium. Features are consistent with a   pseudonormal left ventricular filling pattern, with concomitant   abnormal relaxation and increased filling pressure (grade 2   diastolic dysfunction). - Aortic valve: Transvalvular velocity was within the normal range.   There was no stenosis. There was mild regurgitation. - Mitral valve: Transvalvular velocity was within the normal range.   There was no evidence for stenosis. There was mild regurgitation. - Right ventricle:  The cavity size was normal. Wall thickness was   normal. Systolic function was mildly reduced. - Atrial septum: No defect or patent foramen ovale was identified   by color flow Doppler.    ASSESSMENT:    1. Chronic systolic heart failure (HCC)   2. Coronary artery disease involving coronary bypass graft of native heart without angina pectoris   3. Paroxysmal atrial fibrillation (HCC)   4. Automatic implantable cardioverter-defibrillator in situ   5. Other hyperlipidemia      PLAN:  In order of problems listed above:  1. New York Heart Association functional class II. No evidence of volume overload on exam. On moderate dose lisinopril. Consider switch to [facility]. We will price this out at his pharmacy deceive he can afford it.  2. Asymptomatic with reference to angina. 3. No recurrence of atrial fib. One instance of AF documented on telemetry from his AICD shortly after the device was placed. He raises the question of whether he really needs to be on chronic long-term anticoagulation therapy. 4. Followed by Dr. [name] in the EP clinic 5. Not addressed  One-year follow-up although will be seen earlier if we decide to switch therapy from lisinopril Entresto.  Medication Adjustments/Labs and Tests Ordered: Current medicines are reviewed at length with the patient today.  Concerns regarding medicines are outlined above.  Medication changes, Labs and Tests ordered today are listed in the Patient Instructions below. Patient Instructions  Medication Instructions:  1) Take the prescription given to you to your pharmacy and find out about the cost of the [place].  Please contact our office and let us know if this medication will be affordable for you.  DO NOT start this medication without contacting our office for instructions.    Labwork: None  Testing/Procedures: None  Follow-Up: Your physician wants you to follow-up in: 1 year with Dr. Katrinka Blazing.  You will receive a reminder letter in  the mail two months in advance. If you don't receive a letter, please call our office to schedule the follow-up appointment.   Any Other Special Instructions Will Be Listed Below (If Applicable).     If you need a refill on your cardiac medications before your next appointment, please call your pharmacy.      Signed, Lesleigh Noe, MD  05/26/2017 12:46 PM    [facility] [address] [place], [place], [place]  [address] Phone: [phone]; Fax: [phone]

## 2017-06-13 ENCOUNTER — Other Ambulatory Visit: Payer: Self-pay

## 2017-06-13 ENCOUNTER — Telehealth: Payer: Self-pay | Admitting: Interventional Cardiology

## 2017-06-13 MED ORDER — APIXABAN 5 MG PO TABS: 5.0000 mg | ORAL_TABLET | Freq: Two times a day (BID) | ORAL | 3 refills | Status: DC

## 2017-06-13 NOTE — Telephone Encounter (Signed)
Spoke with pt and he states:  1) He took prescription for Entresto to pharmacy and they told him they couldn't feel it until Prior Auth was completed and approved by insurance.  Advised I would send message to Prior Auth nurses to see about getting this completed.  2) Advised pt he is likely in the doughnut hole in regards to increased cost of Eliquis.  Pt states insurance recently changed at work and so cost went up on medication.  Advised I would also have prior auth nurses see if pt qualifies for pt assistance with the Eliquis.    Pt wanted to know if it was necessary for him to continue Eliquis because he says he can tell when he's in Afib and he has been on it since when he had the 4 day run of 160bpm last year.  Spoke with Dr. Katrinka BlazingSmith and he agrees that pt does need to continue Eliquis.

## 2017-06-13 NOTE — Telephone Encounter (Signed)
New message     1. The pharmacy can not give price for Entresto until they receive a prescription so they can run his insurance.    2. The Eliquis has went from 30 a month to 150, does he really need to take this or can he take something else.

## 2017-06-13 NOTE — Telephone Encounter (Signed)
°  Follow Up ° °Returning call from yesterday. Please call. °

## 2017-06-13 NOTE — Telephone Encounter (Addendum)
**Note De-Identified Brezlyn Manrique Obfuscation** 1. We need to do a Entresto PA through covermymeds. Will start next week as it is too late to start it today.  2.I have spoken with the pt and he is requesting that I mail him a [employer] pt assistance application for his Eliquis. He is aware to call us if he needs help filling out the application. I have placed his application in our out going mail bin.  I have completed the provider part of the application and printed an Eliquis RX. I have placed them in Dr Lonn GeorgiaSmiths mail bin awaiting his signature.

## 2017-06-17 ENCOUNTER — Other Ambulatory Visit: Payer: Self-pay

## 2017-06-17 MED ORDER — SACUBITRIL-VALSARTAN 24-26 MG PO TABS: 1.0000 | ORAL_TABLET | Freq: Two times a day (BID) | ORAL | 3 refills | Status: DC

## 2017-06-17 NOTE — Telephone Encounter (Signed)
I have done an Entresto PA through covermymeds. 

## 2017-06-17 NOTE — Telephone Encounter (Signed)
**Note De-Identified Lenus Trauger Obfuscation** The pt was given a written RX for Entresto at his last OV with Dr Katrinka BlazingSmith on 05/26/17. Sherryll Burgerntresto was not added to the pts med list. I have discussed with Victorino DikeJennifer, Dr Lonn GeorgiaSmiths nurse, who states that the pts starting Entresto dose is 24/26 mg BID.  I have added Entresto 24-26 mg BID to the pts med list.

## 2017-06-18 ENCOUNTER — Other Ambulatory Visit: Payer: Self-pay | Admitting: Interventional Cardiology

## 2017-06-18 ENCOUNTER — Telehealth: Payer: Self-pay | Admitting: Interventional Cardiology

## 2017-06-18 NOTE — Telephone Encounter (Signed)
Patient does not think he will qualify for patient assistance Will forward to Dr Katrinka BlazingSmith for review

## 2017-06-18 NOTE — Telephone Encounter (Signed)
New message     Pt c/o medication issue:  1. Name of Medication: eliquis   2. How are you currently taking this medication (dosage and times per day)?  2x a day  3. Are you having a reaction (difficulty breathing--STAT)? no  4. What is your medication issue? It went from 35 dollars a month to 150, pharmacist said to ask you about putting him on Pradaxa its 35 a month

## 2017-06-18 NOTE — Telephone Encounter (Signed)
Approval received via fax from [employer] on [place] [place]. Approval good until 06/17/2018. [id number]

## 2017-06-18 NOTE — Telephone Encounter (Signed)
Pradaxa 150 mg BID is okay. Check dose with pharmacy

## 2017-06-19 NOTE — Telephone Encounter (Signed)
Left message to call back  

## 2017-06-19 NOTE — Telephone Encounter (Signed)
Follow Up:; ° ° °Returning your call. °

## 2017-06-19 NOTE — Telephone Encounter (Signed)
Spoke with pt about recommendations per Dr. Katrinka BlazingSmith.  Pt mentioned that RPH mentioned to him that he may just need another Prior Auth since his insurance company recently changed plans.  Pt only has enough Eliquis to last through tomorrow.  Pt will come by and pick up samples.  Advised I will send message to Prior Auth team to see about doing a PA with pt's new insurance plan.  Advised if this is not the case, we would switch pt to Pradaxa.  Pt verbalized understanding and was appreciative for assistance.

## 2017-06-19 NOTE — Telephone Encounter (Signed)
CrCl is 8890mL/min - Pradaxa 150mg  BID is correct dosing.

## 2017-06-19 NOTE — Telephone Encounter (Signed)
[facility].

## 2017-06-20 ENCOUNTER — Other Ambulatory Visit: Payer: Self-pay | Admitting: Internal Medicine

## 2017-06-20 NOTE — Telephone Encounter (Signed)
I have done an Eliquis PA through covermymeds. The pt is aware.

## 2017-06-23 MED ORDER — DABIGATRAN ETEXILATE MESYLATE 150 MG PO CAPS: 150.0000 mg | ORAL_CAPSULE | Freq: Two times a day (BID) | ORAL | 6 refills | Status: DC

## 2017-06-23 NOTE — Telephone Encounter (Signed)
**Note De-Identified Shontia Gillooly Obfuscation** Received a fax from [employer] stating that Eliquis is on the pts plan as a covered drug.  I called the pt and advised him. He states that a 30 day supply of Eliquis is $150 and he cannot afford that so he states he does want to switch to Pradaxa. I have sent Pradaxa RX to his pharmacy.

## 2017-06-25 ENCOUNTER — Telehealth: Payer: Self-pay | Admitting: Interventional Cardiology

## 2017-06-25 ENCOUNTER — Ambulatory Visit (INDEPENDENT_AMBULATORY_CARE_PROVIDER_SITE_OTHER): Payer: 59 | Admitting: *Deleted

## 2017-06-25 DIAGNOSIS — I255 Ischemic cardiomyopathy: Secondary | ICD-10-CM | POA: Diagnosis not present

## 2017-06-25 NOTE — Telephone Encounter (Signed)
Spoke with pt and he inquired about getting samples of Eliquis as he is going out of town and will run out before he gets back.  Pt is being switched to Pradaxa but doesn't want to start it while out of town d/t fear of possible interaction.  Advised we do have samples and will place some at front desk for pick up.  Pt will use up samples and then switch to Pradaxa once he returns from trip. Pt appreciative for assistance.

## 2017-06-25 NOTE — Telephone Encounter (Signed)
New message ° ° ° ° °Patient calling the office for samples of medication: ° ° °1.  What medication and dosage are you requesting samples for? Eliquis ° °2.  Are you currently out of this medication? No  ° ° °

## 2017-06-26 NOTE — Progress Notes (Signed)
Remote ICD transmission.   

## 2017-06-27 ENCOUNTER — Encounter: Payer: Self-pay | Admitting: Cardiology

## 2017-06-27 LAB — CUP PACEART REMOTE DEVICE CHECK
HIGH POWER IMPEDANCE MEASURED VALUE: 43 Ohm
HighPow Impedance: 54 Ohm
Implantable Lead Implant Date: 20070921
Implantable Lead Model: 6947
Lead Channel Impedance Value: 418 Ohm
Lead Channel Pacing Threshold Amplitude: 0.5 V
Lead Channel Pacing Threshold Pulse Width: 0.4 ms
Lead Channel Setting Pacing Pulse Width: 0.4 ms
Lead Channel Setting Sensing Sensitivity: 0.3 mV
MDC IDC LEAD LOCATION: 753860
MDC IDC MSMT BATTERY REMAINING LONGEVITY: 124 mo
MDC IDC MSMT BATTERY VOLTAGE: 3.03 V
MDC IDC MSMT LEADCHNL RV IMPEDANCE VALUE: 513 Ohm
MDC IDC MSMT LEADCHNL RV SENSING INTR AMPL: 21.5 mV
MDC IDC MSMT LEADCHNL RV SENSING INTR AMPL: 21.5 mV
MDC IDC PG IMPLANT DT: 20170111
MDC IDC SESS DTM: 20181031082404
MDC IDC SET LEADCHNL RV PACING AMPLITUDE: 2.5 V
MDC IDC STAT BRADY RV PERCENT PACED: 0.1 %

## 2017-06-27 NOTE — Progress Notes (Signed)
Letter  

## 2017-07-17 ENCOUNTER — Other Ambulatory Visit: Payer: Self-pay | Admitting: Interventional Cardiology

## 2017-07-24 DIAGNOSIS — Z23 Encounter for immunization: Secondary | ICD-10-CM | POA: Diagnosis not present

## 2017-09-24 ENCOUNTER — Ambulatory Visit (INDEPENDENT_AMBULATORY_CARE_PROVIDER_SITE_OTHER): Payer: 59 | Admitting: *Deleted

## 2017-09-24 DIAGNOSIS — I255 Ischemic cardiomyopathy: Secondary | ICD-10-CM | POA: Diagnosis not present

## 2017-09-24 NOTE — Progress Notes (Signed)
Remote ICD transmission.   

## 2017-09-25 ENCOUNTER — Encounter: Payer: Self-pay | Admitting: Cardiology

## 2017-10-08 LAB — CUP PACEART REMOTE DEVICE CHECK
Battery Voltage: 3.03 V
Brady Statistic RV Percent Paced: 0.14 %
Date Time Interrogation Session: 20190130092405
HIGH POWER IMPEDANCE MEASURED VALUE: 40 Ohm
HIGH POWER IMPEDANCE MEASURED VALUE: 50 Ohm
Lead Channel Impedance Value: 418 Ohm
Lead Channel Impedance Value: 513 Ohm
Lead Channel Pacing Threshold Amplitude: 0.5 V
Lead Channel Sensing Intrinsic Amplitude: 21.625 mV
Lead Channel Sensing Intrinsic Amplitude: 21.625 mV
MDC IDC LEAD IMPLANT DT: 20070921
MDC IDC LEAD LOCATION: 753860
MDC IDC MSMT BATTERY REMAINING LONGEVITY: 123 mo
MDC IDC MSMT LEADCHNL RV PACING THRESHOLD PULSEWIDTH: 0.4 ms
MDC IDC PG IMPLANT DT: 20170111
MDC IDC SET LEADCHNL RV PACING AMPLITUDE: 2.5 V
MDC IDC SET LEADCHNL RV PACING PULSEWIDTH: 0.4 ms
MDC IDC SET LEADCHNL RV SENSING SENSITIVITY: 0.3 mV

## 2017-12-23 ENCOUNTER — Encounter: Payer: Self-pay | Admitting: Internal Medicine

## 2017-12-24 ENCOUNTER — Ambulatory Visit (INDEPENDENT_AMBULATORY_CARE_PROVIDER_SITE_OTHER): Payer: 59 | Admitting: *Deleted

## 2017-12-24 DIAGNOSIS — I255 Ischemic cardiomyopathy: Secondary | ICD-10-CM | POA: Diagnosis not present

## 2017-12-24 LAB — CUP PACEART REMOTE DEVICE CHECK
Battery Remaining Longevity: 121 mo
Battery Voltage: 3.03 V
Brady Statistic RV Percent Paced: 0.3 %
HIGH POWER IMPEDANCE MEASURED VALUE: 44 Ohm
HIGH POWER IMPEDANCE MEASURED VALUE: 54 Ohm
Implantable Lead Model: 6947
Lead Channel Impedance Value: 475 Ohm
Lead Channel Impedance Value: 532 Ohm
Lead Channel Sensing Intrinsic Amplitude: 25.125 mV
Lead Channel Setting Pacing Amplitude: 2.5 V
Lead Channel Setting Pacing Pulse Width: 0.4 ms
MDC IDC LEAD IMPLANT DT: 20070921
MDC IDC LEAD LOCATION: 753860
MDC IDC MSMT LEADCHNL RV PACING THRESHOLD AMPLITUDE: 0.375 V
MDC IDC MSMT LEADCHNL RV PACING THRESHOLD PULSEWIDTH: 0.4 ms
MDC IDC MSMT LEADCHNL RV SENSING INTR AMPL: 25.125 mV
MDC IDC PG IMPLANT DT: 20170111
MDC IDC SESS DTM: 20190501041804
MDC IDC SET LEADCHNL RV SENSING SENSITIVITY: 0.3 mV

## 2017-12-24 NOTE — Progress Notes (Signed)
Remote ICD transmission.   

## 2017-12-25 ENCOUNTER — Encounter: Payer: Self-pay | Admitting: Cardiology

## 2018-01-12 ENCOUNTER — Ambulatory Visit: Payer: 59 | Admitting: Internal Medicine

## 2018-01-12 ENCOUNTER — Encounter: Payer: Self-pay | Admitting: Internal Medicine

## 2018-01-12 VITALS — BP 120/78 | HR 55 | Ht 72.0 in | Wt 215.8 lb

## 2018-01-12 DIAGNOSIS — I5022 Chronic systolic (congestive) heart failure: Secondary | ICD-10-CM

## 2018-01-12 DIAGNOSIS — I255 Ischemic cardiomyopathy: Secondary | ICD-10-CM | POA: Diagnosis not present

## 2018-01-12 DIAGNOSIS — Z95810 Presence of automatic (implantable) cardiac defibrillator: Secondary | ICD-10-CM | POA: Diagnosis not present

## 2018-01-12 LAB — CUP PACEART INCLINIC DEVICE CHECK
Battery Remaining Longevity: 120 mo
Battery Voltage: 3.03 V
Brady Statistic RV Percent Paced: 0.14 %
Date Time Interrogation Session: 20190520152745
HighPow Impedance: 51 Ohm
HighPow Impedance: 64 Ohm
Implantable Lead Implant Date: 20070921
Implantable Lead Location: 753860
Implantable Lead Model: 6947
Implantable Pulse Generator Implant Date: 20170111
Lead Channel Impedance Value: 475 Ohm
Lead Channel Impedance Value: 532 Ohm
Lead Channel Pacing Threshold Amplitude: 0.5 V
Lead Channel Pacing Threshold Pulse Width: 0.4 ms
Lead Channel Sensing Intrinsic Amplitude: 22.5 mV
Lead Channel Sensing Intrinsic Amplitude: 28.5 mV
Lead Channel Setting Pacing Amplitude: 2.5 V
Lead Channel Setting Pacing Pulse Width: 0.4 ms
Lead Channel Setting Sensing Sensitivity: 0.3 mV

## 2018-01-12 NOTE — Patient Instructions (Signed)
Medication Instructions:  Your physician recommends that you continue on your current medications as directed. Please refer to the Current Medication list given to you today.  Labwork: None ordered.  Testing/Procedures: None ordered.  Follow-Up: Your physician wants you to follow-up in: one year with Dr. [name].   You will receive a reminder letter in the mail two months in advance. If you don't receive a letter, please call our office to schedule the follow-up appointment.  Remote monitoring is used to monitor your ICD from home. This monitoring reduces the number of office visits required to check your device to one time per year. It allows [place] to keep an eye on the functioning of your device to ensure it is working properly. You are scheduled for a device check from home on 03/25/2018. You may send your transmission at any time that day. If you have a wireless device, the transmission will be sent automatically. After your physician reviews your transmission, you will receive a postcard with your next transmission date.  Any Other Special Instructions Will Be Listed Below (If Applicable).  If you need a refill on your cardiac medications before your next appointment, please call your pharmacy.

## 2018-01-12 NOTE — Progress Notes (Signed)
HPI [name] [name] is a 57 y.o. male seen today for ongoing followup of his ICD.  He is s/p ICD gen change over 2 years ago  He presents today for evaluation for ICD generator change. Since last being seen in our clinic, the patient reports doing very well. He denies chest pain, palpitations, dyspnea, PND, orthopnea, nausea, vomiting, dizziness, syncope, edema, weight gain, or early satiety.  He has not had ICD shocks. Echo 08/2014 demonstrated EF 25-30%, akinesis of anteroseptal and apical myocardium, mild MR, mild AR. He remains very active and has no functional limitations. He is able to push mow his lawn and work on the elliptical without chest pain or shortness of breath. He has no HF symptoms. He only notes that he is not able to do things quite as fast as he once was.  Device History: MDT single chamber ICD implanted 2007 for ICM with gen change and pocket rev. 08/2009 and again in Jan 2017 History of appropriate therapy: no History of AAD therapy: no   Allergies  Allergen Reactions  . Adhesive [Tape] Other (See Comments)    REACTION: "IT BURNS ME"  . Eggs Or Egg-Derived Products Nausea And Vomiting  . Penicillins Rash    Has patient had a PCN reaction causing immediate rash, facial/tongue/throat swelling, SOB or lightheadedness with hypotension: Yes Has patient had a PCN reaction causing severe rash involving mucus membranes or skin necrosis: No Has patient had a PCN reaction that required hospitalization No Has patient had a PCN reaction occurring within the last 10 years: No If all of the above answers are "NO", then may proceed with Cephalosporin use.      Current Outpatient Medications  Medication Sig Dispense Refill  . aspirin 81 MG EC tablet Take 1 tablet (81 mg total) by mouth daily.    . carvedilol (COREG) 12.5 MG tablet TAKE 1 TABLET (12.5 MG TOTAL) BY MOUTH 2 (TWO) TIMES DAILY WITH A MEAL. 180 tablet 3  . CRESTOR 40 MG tablet Take 20 mg by mouth daily.  5    . dabigatran (PRADAXA) 150 MG CAPS capsule Take 1 capsule (150 mg total) by mouth 2 (two) times daily. 60 capsule 6  . diphenoxylate-atropine (LOMOTIL) 2.5-0.025 MG per tablet Take 1 tablet by mouth 4 (four) times daily as needed for diarrhea or loose stools.     . fish oil-omega-3 fatty acids 1000 MG capsule Take 1 g by mouth daily.     Marland Kitchen levothyroxine (SYNTHROID, LEVOTHROID) 25 MCG tablet Take 25 mcg by mouth daily.    Marland Kitchen lisinopril (PRINIVIL,ZESTRIL) 20 MG tablet TAKE 1 TABLET BY MOUTH EVERY DAY 90 tablet 3  . Melatonin 5 MG TABS Take 5 mg by mouth at bedtime.     . mirtazapine (REMERON) 30 MG tablet Take 30 mg by mouth at bedtime.    . nitroGLYCERIN (NITROSTAT) 0.4 MG SL tablet PLACE 1 TABLET (0.4 MG TOTAL) UNDER THE TONGUE EVERY 5 (FIVE) MINUTES AS NEEDED AS DIRECTED 25 tablet 2  . PREVALITE 4 GM/DOSE powder Take 1 scoop by mouth daily.    . promethazine (PHENERGAN) 25 MG tablet Take 25 mg by mouth every 6 (six) hours as needed for nausea or vomiting.    . sacubitril-valsartan (ENTRESTO) 24-26 MG Take 1 tablet by mouth 2 (two) times daily. 180 tablet 3   No current facility-administered medications for this visit.      Past Medical History:  Diagnosis Date  . CAD (coronary artery  disease)    Anterior MI with emergent PCI followed by emergent CABG x 3 in April of 2007 per Dr. Dorris Fetch with SVG to LAD and SVG to ramus & OM1  . Chronic systolic heart failure (HCC)   . Hyperlipidemia   . ICD (implantable cardioverter-defibrillator) in place   . MI (myocardial infarction) (HCC) 11/2005    ROS:   All systems reviewed and negative except as noted in the HPI.   Past Surgical History:  Procedure Laterality Date  . CARDIAC CATHETERIZATION  2004, 2007  . CARDIAC DEFIBRILLATOR PLACEMENT  04/2006   Medtronic Virtuoso-Dr. Amil Amen  . CAROTID STENT INSERTION    . CHOLECYSTECTOMY    . CORONARY ARTERY BYPASS GRAFT  11/2005  . EP IMPLANTABLE DEVICE N/A 09/06/2015   Procedure: ICD Generator  Changeout;  Surgeon: Marinus Maw, MD;  Location: [facility] INVASIVE CV LAB;  Service: Cardiovascular;  Laterality: N/A;  . VASECTOMY       Family History  Problem Relation Age of Onset  . Heart attack Father      Social History   Socioeconomic History  . Marital status: Married    Spouse name: Not on file  . Number of children: Not on file  . Years of education: Not on file  . Highest education level: Not on file  Occupational History  . Not on file  Social Needs  . Financial resource strain: Not on file  . Food insecurity:    Worry: Not on file    Inability: Not on file  . Transportation needs:    Medical: Not on file    Non-medical: Not on file  Tobacco Use  . Smoking status: Never Smoker  . Smokeless tobacco: Never Used  Substance and Sexual Activity  . Alcohol use: No    Alcohol/week: 0.0 oz  . Drug use: No  . Sexual activity: Not on file  Lifestyle  . Physical activity:    Days per week: Not on file    Minutes per session: Not on file  . Stress: Not on file  Relationships  . Social connections:    Talks on phone: Not on file    Gets together: Not on file    Attends religious service: Not on file    Active member of club or organization: Not on file    Attends meetings of clubs or organizations: Not on file    Relationship status: Not on file  . Intimate partner violence:    Fear of current or ex partner: Not on file    Emotionally abused: Not on file    Physically abused: Not on file    Forced sexual activity: Not on file  Other Topics Concern  . Not on file  Social History Narrative  . Not on file     BP 120/78   Pulse (!) 55   Ht 6' (1.829 m)   Wt 215 lb 12.8 oz (97.9 kg)   BMI 29.27 kg/m   Physical Exam:  Well appearing 57 yo man, NAD HEENT: Unremarkable Neck:  6 cm JVD, no thyromegally Lymphatics:  No adenopathy Back:  No CVA tenderness Lungs:  Clear with no wheezes HEART:  Regular rate rhythm, no murmurs, no rubs, no clicks Abd:   soft, positive bowel sounds, no organomegally, no rebound, no guarding Ext:  2 plus pulses, no edema, no cyanosis, no clubbing Skin:  No rashes no nodules Neuro:  CN II through XII intact, motor grossly intact  EKG - NSR with  LBBB  DEVICE  Normal device function.  See PaceArt for details.   Assess/Plan: 1. Chronic systolic heart failure - his symptoms are class 2A. He will continue his current meds. 2. ICD  - his medtronic device I working normally with 10 years of battery longevity. 3. Atrial fib - He will continue his pradaxa. He has a single chamber device so cannot see any evidenc of atrial fib on his monitors.  Leonia Reeves.D.

## 2018-02-04 ENCOUNTER — Other Ambulatory Visit: Payer: Self-pay | Admitting: Interventional Cardiology

## 2018-02-04 NOTE — Telephone Encounter (Signed)
Pradaxa 150mg  refill request received; pt is 57 yrs old, wt-97.9kg, last seen by Dr. Ladona Ridgelaylor on 01/12/18, Crea-1.36 on 03/21/17 via PCP office, CrCl-81.6598ml/min; will send in refill to requested pharmacy.

## 2018-02-15 ENCOUNTER — Other Ambulatory Visit: Payer: Self-pay | Admitting: Internal Medicine

## 2018-03-17 ENCOUNTER — Other Ambulatory Visit: Payer: Self-pay | Admitting: Family Medicine

## 2018-03-17 DIAGNOSIS — Z0000 Encounter for general adult medical examination without abnormal findings: Secondary | ICD-10-CM | POA: Diagnosis not present

## 2018-03-17 DIAGNOSIS — M5412 Radiculopathy, cervical region: Secondary | ICD-10-CM

## 2018-03-17 DIAGNOSIS — E7800 Pure hypercholesterolemia, unspecified: Secondary | ICD-10-CM | POA: Diagnosis not present

## 2018-03-25 ENCOUNTER — Ambulatory Visit (INDEPENDENT_AMBULATORY_CARE_PROVIDER_SITE_OTHER): Payer: 59 | Admitting: *Deleted

## 2018-03-25 ENCOUNTER — Encounter: Payer: Self-pay | Admitting: Cardiology

## 2018-03-25 DIAGNOSIS — I255 Ischemic cardiomyopathy: Secondary | ICD-10-CM

## 2018-03-25 NOTE — Progress Notes (Signed)
Remote ICD transmission.   

## 2018-03-31 ENCOUNTER — Other Ambulatory Visit: Payer: Self-pay | Admitting: Internal Medicine

## 2018-03-31 NOTE — Telephone Encounter (Signed)
Pt is a 57 yr old male who last saw Dr Ladona Ridgelaylor on 01/12/18. Weight at that time was 97.9Kg. Last noted SCr was 1.29 on 03/17/18. CrCl is 6486mL/min. Will refill Xarelto 20mg  QD.

## 2018-04-06 ENCOUNTER — Ambulatory Visit
Admission: RE | Admit: 2018-04-06 | Discharge: 2018-04-06 | Disposition: A | Discharge status: Home / Self Care | Payer: 59 | Source: Ambulatory Visit | Attending: Family Medicine | Admitting: Family Medicine

## 2018-04-06 DIAGNOSIS — M5412 Radiculopathy, cervical region: Secondary | ICD-10-CM

## 2018-04-06 DIAGNOSIS — R200 Anesthesia of skin: Secondary | ICD-10-CM | POA: Diagnosis not present

## 2018-04-06 LAB — CUP PACEART REMOTE DEVICE CHECK
Battery Voltage: 3.03 V
Date Time Interrogation Session: 20190731052404
HighPow Impedance: 44 Ohm
HighPow Impedance: 57 Ohm
Implantable Lead Implant Date: 20070921
Implantable Lead Location: 753860
Implantable Pulse Generator Implant Date: 20170111
Lead Channel Impedance Value: 532 Ohm
Lead Channel Pacing Threshold Amplitude: 0.5 V
Lead Channel Pacing Threshold Pulse Width: 0.4 ms
Lead Channel Sensing Intrinsic Amplitude: 19 mV
Lead Channel Setting Sensing Sensitivity: 0.3 mV
MDC IDC MSMT BATTERY REMAINING LONGEVITY: 119 mo
MDC IDC MSMT LEADCHNL RV IMPEDANCE VALUE: 418 Ohm
MDC IDC MSMT LEADCHNL RV SENSING INTR AMPL: 19 mV
MDC IDC SET LEADCHNL RV PACING AMPLITUDE: 2.5 V
MDC IDC SET LEADCHNL RV PACING PULSEWIDTH: 0.4 ms
MDC IDC STAT BRADY RV PERCENT PACED: 0.1 %

## 2018-05-15 DIAGNOSIS — M5412 Radiculopathy, cervical region: Secondary | ICD-10-CM | POA: Diagnosis not present

## 2018-05-28 ENCOUNTER — Other Ambulatory Visit: Payer: Self-pay | Admitting: Internal Medicine

## 2018-06-04 ENCOUNTER — Ambulatory Visit: Payer: 59 | Admitting: Interventional Cardiology

## 2018-06-04 ENCOUNTER — Encounter: Payer: Self-pay | Admitting: Interventional Cardiology

## 2018-06-04 VITALS — BP 104/72 | HR 57 | Ht 72.0 in | Wt 214.6 lb

## 2018-06-04 DIAGNOSIS — Z95810 Presence of automatic (implantable) cardiac defibrillator: Secondary | ICD-10-CM

## 2018-06-04 DIAGNOSIS — I10 Essential (primary) hypertension: Secondary | ICD-10-CM

## 2018-06-04 DIAGNOSIS — I480 Paroxysmal atrial fibrillation: Secondary | ICD-10-CM

## 2018-06-04 DIAGNOSIS — I25810 Atherosclerosis of coronary artery bypass graft(s) without angina pectoris: Secondary | ICD-10-CM

## 2018-06-04 DIAGNOSIS — I5022 Chronic systolic (congestive) heart failure: Secondary | ICD-10-CM

## 2018-06-04 DIAGNOSIS — E7849 Other hyperlipidemia: Secondary | ICD-10-CM

## 2018-06-04 NOTE — Patient Instructions (Signed)
Medication Instructions:  Your physician recommends that you continue on your current medications as directed. Please refer to the Current Medication list given to you today.  If you need a refill on your cardiac medications before your next appointment, please call your pharmacy.   Lab work: None  If you have labs (blood work) drawn today and your tests are completely normal, you will receive your results only by: Marland Kitchen MyChart Message (if you have MyChart) OR . A paper copy in the mail If you have any lab test that is abnormal or we need to change your treatment, we will call you to review the results.  Testing/Procedures: Your physician has requested that you have an echocardiogram in January 2020. Echocardiography is a painless test that uses sound waves to create images of your heart. It provides your doctor with information about the size and shape of your heart and how well your heart's chambers and valves are working. This procedure takes approximately one hour. There are no restrictions for this procedure.   Follow-Up: At [facility], you and your health needs are our priority.  As part of our continuing mission to provide you with exceptional heart care, we have created designated Provider Care Teams.  These Care Teams include your primary Cardiologist (physician) and Advanced Practice Providers (APPs -  Physician Assistants and Nurse Practitioners) who all work together to provide you with the care you need, when you need it. You will need a follow up appointment in 12 months.  Please call our office 2 months in advance to schedule this appointment.  You may see Dr. Katrinka Blazing or one of the following Advanced Practice Providers on your designated Care Team:   Norma Fredrickson, NP Nada Boozer, NP . Georgie Chard, NP  Any Other Special Instructions Will Be Listed Below (If Applicable).  Please speak with your pharmacy about pricing for Entresto 24/26mg - one tablet twice daily.  Make sure  to ask them if a 30 day or a 90 day prescription would be cheaper.

## 2018-06-04 NOTE — Progress Notes (Signed)
Cardiology Office Note:    Date:  06/04/2018   ID:  [name], [name] 1960/08/28, MRN [id number]  PCP:  [name], MD  Cardiologist:  No primary care provider on file.   Referring MD: [name], MD   Chief Complaint  Patient presents with  . Coronary Artery Disease  . Congestive Heart Failure    History of Present Illness:    [name] [name] is a 57 y.o. male with a hx of  ostial LAD Taxus DES stent, subsequent acute stent thrombosis 2007, emergency coronary bypass grafting with saphenous vein grafts 2007, chronic systolic heart failure with EF 25-30% January 2016, hyperlipidemia, and AICD.   The patient has an ischemic cardiomyopathy.  He underwent surgical revascularization 12 years ago.  No ischemic symptoms since that time.  He has chronic systolic heart failure with mild limitations due to dyspnea.  He denies orthopnea, PND, and syncope.  He has not had an AICD discharge.  He had a large anterior myocardial infarction after acute stent thrombosis with Taxus DES in 2007.  He denies claudication.  No neurological complaints.   Past Medical History:  Diagnosis Date  . CAD (coronary artery disease)    Anterior MI with emergent PCI followed by emergent CABG x 3 in April of 2007 per Dr. Dorris Fetch with SVG to LAD and SVG to ramus & OM1  . Chronic systolic heart failure (HCC)   . Hyperlipidemia   . ICD (implantable cardioverter-defibrillator) in place   . MI (myocardial infarction) (HCC) 11/2005    Past Surgical History:  Procedure Laterality Date  . CARDIAC CATHETERIZATION  2004, 2007  . CARDIAC DEFIBRILLATOR PLACEMENT  04/2006   [employer]oso-Dr. Amil Amen  . CAROTID STENT INSERTION    . CHOLECYSTECTOMY    . CORONARY ARTERY BYPASS GRAFT  11/2005  . EP IMPLANTABLE DEVICE N/A 09/06/2015   Procedure: ICD Generator Changeout;  Surgeon: Marinus Maw, MD;  Location: [facility] INVASIVE CV LAB;  Service: Cardiovascular;  Laterality: N/A;  . VASECTOMY       Current Medications: Current Meds  Medication Sig  . aspirin 81 MG EC tablet Take 1 tablet (81 mg total) by mouth daily.  . carvedilol (COREG) 12.5 MG tablet TAKE 1 TABLET (12.5 MG TOTAL) BY MOUTH 2 (TWO) TIMES DAILY WITH A MEAL.  Marland Kitchen CRESTOR 40 MG tablet Take 20 mg by mouth daily.  . diphenoxylate-atropine (LOMOTIL) 2.5-0.025 MG per tablet Take 1 tablet by mouth 4 (four) times daily as needed for diarrhea or loose stools.   . fish oil-omega-3 fatty acids 1000 MG capsule Take 1 g by mouth daily.   Marland Kitchen levothyroxine (SYNTHROID, LEVOTHROID) 25 MCG tablet Take 25 mcg by mouth daily.  Marland Kitchen lisinopril (PRINIVIL,ZESTRIL) 20 MG tablet TAKE 1 TABLET BY MOUTH EVERY DAY  . Melatonin 5 MG TABS Take 5 mg by mouth at bedtime.   . mirtazapine (REMERON) 30 MG tablet Take 30 mg by mouth at bedtime.  . nitroGLYCERIN (NITROSTAT) 0.4 MG SL tablet Place 1 tablet (0.4 mg total) under the tongue every 5 (five) minutes as needed for chest pain. Please make appt with Dr. Katrinka Blazing.  Marland Kitchen PRADAXA 150 MG CAPS capsule TAKE 1 CAPSULE BY MOUTH TWICE A DAY  . promethazine (PHENERGAN) 25 MG tablet Take 25 mg by mouth every 6 (six) hours as needed for nausea or vomiting.  . sacubitril-valsartan (ENTRESTO) 24-26 MG Take 1 tablet by mouth 2 (two) times daily.     Allergies:   Adhesive [tape]; Eggs or  egg-derived products; and Penicillins   Social History   Socioeconomic History  . Marital status: Married    Spouse name: Not on file  . Number of children: Not on file  . Years of education: Not on file  . Highest education level: Not on file  Occupational History  . Not on file  Social Needs  . Financial resource strain: Not on file  . Food insecurity:    Worry: Not on file    Inability: Not on file  . Transportation needs:    Medical: Not on file    Non-medical: Not on file  Tobacco Use  . Smoking status: Never Smoker  . Smokeless tobacco: Never Used  Substance and Sexual Activity  . Alcohol use: No    Alcohol/week:  0.0 standard drinks  . Drug use: No  . Sexual activity: Not on file  Lifestyle  . Physical activity:    Days per week: Not on file    Minutes per session: Not on file  . Stress: Not on file  Relationships  . Social connections:    Talks on phone: Not on file    Gets together: Not on file    Attends religious service: Not on file    Active member of club or organization: Not on file    Attends meetings of clubs or organizations: Not on file    Relationship status: Not on file  Other Topics Concern  . Not on file  Social History Narrative  . Not on file     Family History: The patient's family history includes Heart attack in his father.  ROS:   Please see the history of present illness.    He is active and outdoor chores.  He recently put up a fence that required digging 26 post holes.  He did this on himself.  He had a 48-hour pulse oximetry test done by his dentist and there was no hypoxia noted.  I wanted him to have a sleep study performed in the past but he cannot afford it based upon his insurance plan.  All other systems reviewed and are negative.  EKGs/Labs/Other Studies Reviewed:    The following studies were reviewed today:   Last LVEF by echo in 2017: Study Conclusions  - Left ventricle: The cavity size was severely dilated. Systolic   function was severely reduced. The estimated ejection fraction   was in the range of 25% to 30%. Diffuse hypokinesis. Akinesis of   the anteroseptal myocardium. Features are consistent with a   pseudonormal left ventricular filling pattern, with concomitant   abnormal relaxation and increased filling pressure (grade 2   diastolic dysfunction). - Aortic valve: Transvalvular velocity was within the normal range.   There was no stenosis. There was mild regurgitation. - Mitral valve: Transvalvular velocity was within the normal range.   There was no evidence for stenosis. There was mild regurgitation. - Right ventricle: The cavity  size was normal. Wall thickness was   normal. Systolic function was mildly reduced. - Atrial septum: No defect or patent foramen ovale was identified   by color flow Doppler.  EKG:  EKG is not ordered today.  The ekg may 2019 reveals left bundle branch block QRS duration 160 ms, mild prolongation of PR interval, sinus rhythm,  Recent Labs: No results found for requested labs within last 8760 hours.  Recent Lipid Panel No results found for: CHOL, TRIG, HDL, CHOLHDL, VLDL, LDLCALC, LDLDIRECT  Physical Exam:    VS:  BP 104/72   Pulse (!) 57   Ht 6' (1.829 m)   Wt 214 lb 9.6 oz (97.3 kg)   BMI 29.10 kg/m     Wt Readings from Last 3 Encounters:  06/04/18 214 lb 9.6 oz (97.3 kg)  01/12/18 215 lb 12.8 oz (97.9 kg)  05/26/17 218 lb 6.4 oz (99.1 kg)     GEN: Healthy.  Well nourished, well developed in no acute distress HEENT: Normal NECK: No JVD. LYMPHATICS: No lymphadenopathy CARDIAC: RRR, 1/6 apical systolic murmur, S4 gallop, no edema. VASCULAR: 2+ bilateral radial and carotid pulses.  No bruits. RESPIRATORY:  Clear to auscultation without rales, wheezing or rhonchi  ABDOMEN: Soft, non-tender, non-distended, No pulsatile mass, MUSCULOSKELETAL: No deformity  SKIN: Warm and dry NEUROLOGIC:  Alert and oriented x 3 PSYCHIATRIC:  Normal affect   ASSESSMENT:    1. Chronic systolic heart failure (HCC)   2. Coronary artery disease involving coronary bypass graft of native heart without angina pectoris   3. Automatic implantable cardioverter-defibrillator in situ   4. Other hyperlipidemia   5. Paroxysmal atrial fibrillation (HCC)   6. Essential hypertension    PLAN:    In order of problems listed above:  1. Last LVEF was approximately 30% 2 years ago.  This needs to be reassessed.  We will do it sometime in the new year.  He is not on Entresto.  We have discussed this in the past.  He will price it out with his insurance company.  We discussed the new data and important  information concerning long-term improvement/survival with Entresto over ACe and ARB therapy 2. Asymptomatic with reference to angina.  Secondary prevention is encouraged with glycemic, lipid, blood pressure, and activity management. 3. Continue to follow in device clinic 4. LDL target less than 70 was achieved in July 2019 on current therapy when he was noted to be at 67. 5. No clinical recurrences.  He is on anticoagulation therapy because device has detected episodes of A. Fib. 6. Blood pressure target is 130/80 mmHg or less.  Blood pressure is well controlled.  If we switch to [facility] we will need to watch the blood pressure.  Again we will try to transition him to [facility].  He will check his insurance plan and price out therapy.  We will get an echocardiogram done 2021st quarter.  He is encouraged to exercise moderately with greater than 150 minutes/week.  Low-fat diet discussed.  Low-salt diet discussed.  Greater than 50% of the time was spent in education and coordination of care.   Medication Adjustments/Labs and Tests Ordered: Current medicines are reviewed at length with the patient today.  Concerns regarding medicines are outlined above.  Orders Placed This Encounter  Procedures  . ECHOCARDIOGRAM COMPLETE   No orders of the defined types were placed in this encounter.   Patient Instructions  Medication Instructions:  Your physician recommends that you continue on your current medications as directed. Please refer to the Current Medication list given to you today.  If you need a refill on your cardiac medications before your next appointment, please call your pharmacy.   Lab work: None  If you have labs (blood work) drawn today and your tests are completely normal, you will receive your results only by: Marland Kitchen MyChart Message (if you have MyChart) OR . A paper copy in the mail If you have any lab test that is abnormal or we need to change your treatment, we will call you to  review the  results.  Testing/Procedures: Your physician has requested that you have an echocardiogram in January 2020. Echocardiography is a painless test that uses sound waves to create images of your heart. It provides your doctor with information about the size and shape of your heart and how well your heart's chambers and valves are working. This procedure takes approximately one hour. There are no restrictions for this procedure.   Follow-Up: At [facility], you and your health needs are our priority.  As part of our continuing mission to provide you with exceptional heart care, we have created designated Provider Care Teams.  These Care Teams include your primary Cardiologist (physician) and Advanced Practice Providers (APPs -  Physician Assistants and Nurse Practitioners) who all work together to provide you with the care you need, when you need it. You will need a follow up appointment in 12 months.  Please call our office 2 months in advance to schedule this appointment.  You may see Dr. Katrinka Blazing or one of the following Advanced Practice Providers on your designated Care Team:   Norma Fredrickson, NP Nada Boozer, NP . Georgie Chard, NP  Any Other Special Instructions Will Be Listed Below (If Applicable).  Please speak with your pharmacy about pricing for Entresto 24/26mg - one tablet twice daily.  Make sure to ask them if a 30 day or a 90 day prescription would be cheaper.      Signed, Lesleigh Noe, MD  06/04/2018 6:00 PM    [facility]

## 2018-06-08 ENCOUNTER — Telehealth: Payer: Self-pay

## 2018-06-08 NOTE — Telephone Encounter (Signed)
**Note De-Identified [name] [name] Obfuscation** Letter received [name] [name] fax from [employer] stating that [name] [name] was previously approved on [id number] from 06/17/2017 until 06/17/2019. Reference #: [id number]

## 2018-06-15 ENCOUNTER — Other Ambulatory Visit: Payer: Self-pay

## 2018-06-15 MED ORDER — SACUBITRIL-VALSARTAN 24-26 MG PO TABS: 1.0000 | ORAL_TABLET | Freq: Two times a day (BID) | ORAL | 1 refills | Status: DC

## 2018-06-23 ENCOUNTER — Other Ambulatory Visit: Payer: Self-pay | Admitting: *Deleted

## 2018-06-23 DIAGNOSIS — I5022 Chronic systolic (congestive) heart failure: Secondary | ICD-10-CM

## 2018-06-23 NOTE — Progress Notes (Signed)
F/u labs after Entresto start

## 2018-06-24 ENCOUNTER — Ambulatory Visit (INDEPENDENT_AMBULATORY_CARE_PROVIDER_SITE_OTHER): Payer: 59 | Admitting: *Deleted

## 2018-06-24 DIAGNOSIS — I5022 Chronic systolic (congestive) heart failure: Secondary | ICD-10-CM

## 2018-06-24 DIAGNOSIS — I255 Ischemic cardiomyopathy: Secondary | ICD-10-CM

## 2018-06-24 NOTE — Progress Notes (Signed)
Remote ICD transmission.   

## 2018-07-02 ENCOUNTER — Other Ambulatory Visit: Payer: 59 | Admitting: *Deleted

## 2018-07-02 ENCOUNTER — Other Ambulatory Visit: Payer: Self-pay | Admitting: Interventional Cardiology

## 2018-07-02 DIAGNOSIS — I5022 Chronic systolic (congestive) heart failure: Secondary | ICD-10-CM

## 2018-07-02 DIAGNOSIS — Z23 Encounter for immunization: Secondary | ICD-10-CM | POA: Diagnosis not present

## 2018-07-03 ENCOUNTER — Encounter: Payer: Self-pay | Admitting: Cardiology

## 2018-07-03 LAB — BASIC METABOLIC PANEL
BUN/Creatinine Ratio: 9 (ref 9–20)
BUN: 12 mg/dL (ref 6–24)
CO2: 24 mmol/L (ref 20–29)
CREATININE: 1.39 mg/dL — AB (ref 0.76–1.27)
Calcium: 9 mg/dL (ref 8.7–10.2)
Chloride: 110 mmol/L — ABNORMAL HIGH (ref 96–106)
GFR calc Af Amer: 64 mL/min/{1.73_m2} (ref >59)
GFR, EST NON AFRICAN AMERICAN: 55 mL/min/{1.73_m2} — AB (ref >59)
Glucose: 84 mg/dL (ref 65–99)
Potassium: 4.6 mmol/L (ref 3.5–5.2)
SODIUM: 148 mmol/L — AB (ref 134–144)

## 2018-07-19 NOTE — Progress Notes (Signed)
Cardiology Office Note:    Date:  07/21/2018   ID:  [name] [name] 07-24-1961, MRN [id number]  PCP:  [name], MD  Cardiologist:  No primary care provider on file.   Referring MD: [name], MD   Chief Complaint  Patient presents with  . Coronary Artery Disease  . Congestive Heart Failure    History of Present Illness:    [name] [name] is a 57 y.o. male with a hx of  ostial LAD Taxus DES stent, subsequent acute stent thrombosis 2007, emergency coronary bypass grafting with saphenous vein grafts 2007, chronic systolic heart failure with EF 25-30% January 2016, hyperlipidemia, and AICD.   Conversion to Entresto fall 2019.  Overall, [name] [name] is doing relatively well.  He denies syncope.  He has not had an AICD discharge.  After starting Entresto he has noted no side effects or symptoms.  No episodes of syncope.  He has monitored his blood pressure all of which have ranged between 105/60 to 130/70 mmHg.  He is affording Entresto currently.  Past Medical History:  Diagnosis Date  . CAD (coronary artery disease)    Anterior MI with emergent PCI followed by emergent CABG x 3 in April of 2007 per Dr. Dorris Fetch with SVG to LAD and SVG to ramus & OM1  . Chronic systolic heart failure (HCC)   . Hyperlipidemia   . ICD (implantable cardioverter-defibrillator) in place   . MI (myocardial infarction) (HCC) 11/2005    Past Surgical History:  Procedure Laterality Date  . CARDIAC CATHETERIZATION  2004, 2007  . CARDIAC DEFIBRILLATOR PLACEMENT  04/2006   [employer]-Dr. Amil Amen  . CAROTID STENT INSERTION    . CHOLECYSTECTOMY    . CORONARY ARTERY BYPASS GRAFT  11/2005  . EP IMPLANTABLE DEVICE N/A 09/06/2015   Procedure: ICD Generator Changeout;  Surgeon: Marinus Maw, MD;  Location: [facility] INVASIVE CV LAB;  Service: Cardiovascular;  Laterality: N/A;  . VASECTOMY      Current Medications: Current Meds  Medication Sig  . aspirin 81 MG EC tablet Take 1 tablet (81  mg total) by mouth daily.  . carvedilol (COREG) 12.5 MG tablet TAKE 1 TABLET (12.5 MG TOTAL) BY MOUTH 2 (TWO) TIMES DAILY WITH A MEAL.  Marland Kitchen CRESTOR 40 MG tablet Take 20 mg by mouth daily.  . diphenoxylate-atropine (LOMOTIL) 2.5-0.025 MG per tablet Take 1 tablet by mouth 4 (four) times daily as needed for diarrhea or loose stools.   . fish oil-omega-3 fatty acids 1000 MG capsule Take 1 g by mouth daily.   Marland Kitchen levothyroxine (SYNTHROID, LEVOTHROID) 25 MCG tablet Take 25 mcg by mouth daily.  . Melatonin 5 MG TABS Take 5 mg by mouth as needed.   . mirtazapine (REMERON) 30 MG tablet Take 30 mg by mouth at bedtime.  . nitroGLYCERIN (NITROSTAT) 0.4 MG SL tablet Place 1 tablet (0.4 mg total) under the tongue every 5 (five) minutes as needed for chest pain. Please make appt with Dr. Katrinka Blazing.  Marland Kitchen PRADAXA 150 MG CAPS capsule TAKE 1 CAPSULE BY MOUTH TWICE A DAY  . promethazine (PHENERGAN) 25 MG tablet Take 25 mg by mouth every 6 (six) hours as needed for nausea or vomiting.  . [DISCONTINUED] sacubitril-valsartan (ENTRESTO) 24-26 MG Take 1 tablet by mouth 2 (two) times daily.     Allergies:   Adhesive [tape]; Eggs or egg-derived products; and Penicillins   Social History   Socioeconomic History  . Marital status: Married    Spouse name: Not on  file  . Number of children: Not on file  . Years of education: Not on file  . Highest education level: Not on file  Occupational History  . Not on file  Social Needs  . Financial resource strain: Not on file  . Food insecurity:    Worry: Not on file    Inability: Not on file  . Transportation needs:    Medical: Not on file    Non-medical: Not on file  Tobacco Use  . Smoking status: Never Smoker  . Smokeless tobacco: Never Used  Substance and Sexual Activity  . Alcohol use: No    Alcohol/week: 0.0 standard drinks  . Drug use: No  . Sexual activity: Not on file  Lifestyle  . Physical activity:    Days per week: Not on file    Minutes per session: Not on  file  . Stress: Not on file  Relationships  . Social connections:    Talks on phone: Not on file    Gets together: Not on file    Attends religious service: Not on file    Active member of club or organization: Not on file    Attends meetings of clubs or organizations: Not on file    Relationship status: Not on file  Other Topics Concern  . Not on file  Social History Narrative  . Not on file     Family History: The patient's family history includes Heart attack in his father.  ROS:   Please see the history of present illness.    None all other systems reviewed and are negative.  EKGs/Labs/Other Studies Reviewed:    The following studies were reviewed today: To have an echocardiogram done in January 2020  EKG:  EKG is  ordered today.  The ekg ordered today demonstrates sinus rhythm with right bundle branch block, left anterior hemiblock, mild first-degree AV block at 208 ms.  No change when compared to prior.  Recent Labs: 07/02/2018: BUN 12; Creatinine, Ser 1.39; Potassium 4.6; Sodium 148  Recent Lipid Panel No results found for: CHOL, TRIG, HDL, CHOLHDL, VLDL, LDLCALC, LDLDIRECT  Physical Exam:    VS:  BP 100/70   Pulse 60   Ht 6' (1.829 m)   Wt 215 lb 12.8 oz (97.9 kg)   SpO2 95%   BMI 29.27 kg/m     Wt Readings from Last 3 Encounters:  07/21/18 215 lb 12.8 oz (97.9 kg)  06/04/18 214 lb 9.6 oz (97.3 kg)  01/12/18 215 lb 12.8 oz (97.9 kg)     GEN:  Well nourished, well developed in no acute distress HEENT: Normal NECK: No JVD. LYMPHATICS: No lymphadenopathy CARDIAC: RRR, no murmur, no gallop, no  edema. VASCULAR: 2+ bilateral radial and carotid pulses.  No bruits. RESPIRATORY:  Clear to auscultation without rales, wheezing or rhonchi  ABDOMEN: Soft, non-tender, non-distended, No pulsatile mass, MUSCULOSKELETAL: No deformity  SKIN: Warm and dry NEUROLOGIC:  Alert and oriented x 3 PSYCHIATRIC:  Normal affect   ASSESSMENT:    1. Chronic systolic heart  failure (HCC)   2. Automatic implantable cardioverter-defibrillator in situ   3. Other hyperlipidemia   4. Coronary artery disease involving coronary bypass graft of native heart without angina pectoris   5. Paroxysmal atrial fibrillation (HCC)   6. Chronic anticoagulation    PLAN:    In order of problems listed above:  1. We are uptitrating guideline directed therapy for systolic heart failure.  Currently have strong beta-blocker and renin angiotensin blockade.  Patient does not require a diuretic.  At some point in the future, I may consider adding very low-dose mineralocorticoid antagonist therapy.  Echocardiogram will be performed in 6 to 8 weeks.  Increase Entresto to 49/51 mg p.o. twice daily with basic metabolic panel 2 weeks later. 2. Not discussed 3. Target LDL less than 70.  Most recent value was 67 in July. 4. Asymptomatic with reference to any anginal complaints.  Again reviewed secondary risk prevention in some detail. 5. Has never had symptomatic atrial fibrillation but was picked up on his device and has been on Pradaxa therapy ever since.  He had side effects to Eliquis.  4 months clinical follow-up.  Discussed uptitrating Entresto.  Asked to continue recording blood pressures.  Notify us for systolic blood pressures consistently less than 100 mmHg.   Medication Adjustments/Labs and Tests Ordered: Current medicines are reviewed at length with the patient today.  Concerns regarding medicines are outlined above.  Orders Placed This Encounter  Procedures  . Basic metabolic panel  . EKG 12-Lead   Meds ordered this encounter  Medications  . sacubitril-valsartan (ENTRESTO) 49-51 MG    Sig: Take 1 tablet by mouth 2 (two) times daily.    Dispense:  60 tablet    Refill:  11    Dose change    Patient Instructions  Medication Instructions:  1) INCREASE Entresto to 49/51 twice daily  If you need a refill on your cardiac medications before your next appointment, please call  your pharmacy.   Lab work: Your physician recommends that you return for lab work in: 10-14 days (BMET)  If you have labs (blood work) drawn today and your tests are completely normal, you will receive your results only by: Marland Kitchen MyChart Message (if you have MyChart) OR . A paper copy in the mail If you have any lab test that is abnormal or we need to change your treatment, we will call you to review the results.  Testing/Procedures: None  Follow-Up: At [facility], you and your health needs are our priority.  As part of our continuing mission to provide you with exceptional heart care, we have created designated Provider Care Teams.  These Care Teams include your primary Cardiologist (physician) and Advanced Practice Providers (APPs -  Physician Assistants and Nurse Practitioners) who all work together to provide you with the care you need, when you need it. You will need a follow up appointment in 4 months.  Please call our office 2 months in advance to schedule this appointment.  You may see Dr. Katrinka Blazing or one of the following Advanced Practice Providers on your designated Care Team:   Norma Fredrickson, NP Nada Boozer, NP . Georgie Chard, NP  Any Other Special Instructions Will Be Listed Below (If Applicable).       Signed, Lesleigh Noe, MD  07/21/2018 1:00 PM    [facility]

## 2018-07-21 ENCOUNTER — Encounter: Payer: Self-pay | Admitting: Interventional Cardiology

## 2018-07-21 ENCOUNTER — Ambulatory Visit: Payer: 59 | Admitting: Interventional Cardiology

## 2018-07-21 VITALS — BP 100/70 | HR 60 | Ht 72.0 in | Wt 215.8 lb

## 2018-07-21 DIAGNOSIS — I25810 Atherosclerosis of coronary artery bypass graft(s) without angina pectoris: Secondary | ICD-10-CM

## 2018-07-21 DIAGNOSIS — E7849 Other hyperlipidemia: Secondary | ICD-10-CM

## 2018-07-21 DIAGNOSIS — I5022 Chronic systolic (congestive) heart failure: Secondary | ICD-10-CM | POA: Diagnosis not present

## 2018-07-21 DIAGNOSIS — I480 Paroxysmal atrial fibrillation: Secondary | ICD-10-CM

## 2018-07-21 DIAGNOSIS — Z7901 Long term (current) use of anticoagulants: Secondary | ICD-10-CM

## 2018-07-21 DIAGNOSIS — Z95810 Presence of automatic (implantable) cardiac defibrillator: Secondary | ICD-10-CM

## 2018-07-21 MED ORDER — SACUBITRIL-VALSARTAN 49-51 MG PO TABS: 1.0000 | ORAL_TABLET | Freq: Two times a day (BID) | ORAL | 11 refills | Status: DC

## 2018-07-21 NOTE — Patient Instructions (Signed)
Medication Instructions:  1) INCREASE Entresto to 49/51 twice daily  If you need a refill on your cardiac medications before your next appointment, please call your pharmacy.   Lab work: Your physician recommends that you return for lab work in: 10-14 days (BMET)  If you have labs (blood work) drawn today and your tests are completely normal, you will receive your results only by: Marland Kitchen. MyChart Message (if you have MyChart) OR . A paper copy in the mail If you have any lab test that is abnormal or we need to change your treatment, we will call you to review the results.  Testing/Procedures: None  Follow-Up: [facility], you and your health needs are our priority.  As part of our continuing mission to provide you with exceptional heart care, we have created designated Provider Care Teams.  These Care Teams include your primary Cardiologist (physician) and Advanced Practice Providers (APPs -  Physician Assistants and Nurse Practitioners) who all work together to provide you with the care you need, when you need it. You will need a follow up appointment in 4 months.  Please call our office 2 months in advance to schedule this appointment.  You may see Dr. Katrinka BlazingSmith or one of the following Advanced Practice Providers on your designated Care Team:   Norma FredricksonLori Gerhardt, NP Nada BoozerLaura Ingold, NP . Georgie ChardJill McDaniel, NP  Any Other Special Instructions Will Be Listed Below (If Applicable).

## 2018-07-31 ENCOUNTER — Other Ambulatory Visit: Payer: 59

## 2018-08-05 ENCOUNTER — Other Ambulatory Visit: Payer: Self-pay

## 2018-08-05 DIAGNOSIS — I5022 Chronic systolic (congestive) heart failure: Secondary | ICD-10-CM

## 2018-08-06 LAB — BASIC METABOLIC PANEL
BUN/Creatinine Ratio: 12 (ref 9–20)
BUN: 14 mg/dL (ref 6–24)
CHLORIDE: 108 mmol/L — AB (ref 96–106)
CO2: 20 mmol/L (ref 20–29)
CREATININE: 1.13 mg/dL (ref 0.76–1.27)
Calcium: 8.7 mg/dL (ref 8.7–10.2)
GFR calc Af Amer: 82 mL/min/{1.73_m2} (ref >59)
GFR, EST NON AFRICAN AMERICAN: 71 mL/min/{1.73_m2} (ref >59)
Glucose: 123 mg/dL — ABNORMAL HIGH (ref 65–99)
POTASSIUM: 4 mmol/L (ref 3.5–5.2)
Sodium: 142 mmol/L (ref 134–144)

## 2018-08-23 LAB — CUP PACEART REMOTE DEVICE CHECK
HighPow Impedance: 44 Ohm
HighPow Impedance: 56 Ohm
Implantable Lead Implant Date: 20070921
Implantable Lead Location: 753860
Implantable Lead Model: 6947
Implantable Pulse Generator Implant Date: 20170111
Lead Channel Pacing Threshold Amplitude: 0.5 V
Lead Channel Pacing Threshold Pulse Width: 0.4 ms
Lead Channel Sensing Intrinsic Amplitude: 22 mV
Lead Channel Setting Pacing Pulse Width: 0.4 ms
Lead Channel Setting Sensing Sensitivity: 0.3 mV
MDC IDC MSMT BATTERY REMAINING LONGEVITY: 116 mo
MDC IDC MSMT BATTERY VOLTAGE: 3.03 V
MDC IDC MSMT LEADCHNL RV IMPEDANCE VALUE: 456 Ohm
MDC IDC MSMT LEADCHNL RV IMPEDANCE VALUE: 513 Ohm
MDC IDC MSMT LEADCHNL RV SENSING INTR AMPL: 22 mV
MDC IDC SESS DTM: 20191030083824
MDC IDC SET LEADCHNL RV PACING AMPLITUDE: 2.5 V
MDC IDC STAT BRADY RV PERCENT PACED: 0.12 %

## 2018-09-04 ENCOUNTER — Ambulatory Visit (HOSPITAL_COMMUNITY): Payer: 59 | Attending: Cardiovascular Disease

## 2018-09-04 ENCOUNTER — Other Ambulatory Visit: Payer: Self-pay

## 2018-09-04 DIAGNOSIS — I5022 Chronic systolic (congestive) heart failure: Secondary | ICD-10-CM

## 2018-09-23 ENCOUNTER — Ambulatory Visit (INDEPENDENT_AMBULATORY_CARE_PROVIDER_SITE_OTHER): Payer: 59

## 2018-09-23 DIAGNOSIS — I5022 Chronic systolic (congestive) heart failure: Secondary | ICD-10-CM

## 2018-09-23 DIAGNOSIS — I255 Ischemic cardiomyopathy: Secondary | ICD-10-CM

## 2018-09-24 NOTE — Progress Notes (Signed)
Remote ICD transmission.   

## 2018-09-25 LAB — CUP PACEART REMOTE DEVICE CHECK
Battery Remaining Longevity: 113 mo
Brady Statistic RV Percent Paced: 0.46 %
Date Time Interrogation Session: 20200129051804
HIGH POWER IMPEDANCE MEASURED VALUE: 41 Ohm
HIGH POWER IMPEDANCE MEASURED VALUE: 52 Ohm
Implantable Lead Implant Date: 20070921
Implantable Pulse Generator Implant Date: 20170111
Lead Channel Impedance Value: 418 Ohm
Lead Channel Pacing Threshold Amplitude: 0.375 V
Lead Channel Pacing Threshold Pulse Width: 0.4 ms
Lead Channel Sensing Intrinsic Amplitude: 22.375 mV
Lead Channel Sensing Intrinsic Amplitude: 22.375 mV
Lead Channel Setting Pacing Pulse Width: 0.4 ms
Lead Channel Setting Sensing Sensitivity: 0.3 mV
MDC IDC LEAD LOCATION: 753860
MDC IDC MSMT BATTERY VOLTAGE: 3.02 V
MDC IDC MSMT LEADCHNL RV IMPEDANCE VALUE: 475 Ohm
MDC IDC SET LEADCHNL RV PACING AMPLITUDE: 2.5 V

## 2018-10-19 DIAGNOSIS — J111 Influenza due to unidentified influenza virus with other respiratory manifestations: Secondary | ICD-10-CM | POA: Diagnosis not present

## 2018-10-19 DIAGNOSIS — R6889 Other general symptoms and signs: Secondary | ICD-10-CM | POA: Diagnosis not present

## 2018-10-22 ENCOUNTER — Telehealth: Payer: Self-pay | Admitting: Interventional Cardiology

## 2018-10-22 ENCOUNTER — Ambulatory Visit
Admission: RE | Admit: 2018-10-22 | Discharge: 2018-10-22 | Disposition: A | Discharge status: Home / Self Care | Payer: 59 | Source: Ambulatory Visit | Attending: Family Medicine | Admitting: Family Medicine

## 2018-10-22 ENCOUNTER — Other Ambulatory Visit: Payer: Self-pay | Admitting: Family Medicine

## 2018-10-22 DIAGNOSIS — R0602 Shortness of breath: Secondary | ICD-10-CM | POA: Diagnosis not present

## 2018-10-22 DIAGNOSIS — J209 Acute bronchitis, unspecified: Secondary | ICD-10-CM | POA: Diagnosis not present

## 2018-10-22 NOTE — Telephone Encounter (Signed)
New Message    Patient states that on Sunday he got the flu. Monday tested positive for the flu. Started tamiiflu. This morning 3:30am he started experiencing a feeling that he could not breath. It went away he laid down and that feeling cam back.      Pt c/o Shortness Of Breath: STAT if SOB developed within the last 24 hours or pt is noticeably SOB on the phone  1. Are you currently SOB (can you hear that pt is SOB on the phone)? No, did not hear sob on the phone  2. How long have you been experiencing SOB? On and off since 3:30am   3. Are you SOB when sitting or when up moving around? He experienced it upon laying down  4. Are you currently experiencing any other symptoms? Patient just tested positive for the flu

## 2018-10-22 NOTE — Telephone Encounter (Signed)
Spoke with patient who reports he was DX with the flu on Monday.  He has been having fevers as high as 103- 104, coughing etc.  This am he woke feeling like he was smothering X 2 and reports Dr Katrinka Blazing told him anytime that happened to call him immediately.  Pt's daily weights have been decreasing and he denies any lower extremity edema. He s/s of CHF.  His temps now are mainly in the 99 degree range.  He reports a NPC.  Advised I will forward this information to Dr Katrinka Blazing for his review however he should contact his PCP to make them aware of the smothering feeling he woke with this AM.  He is aware we will call back with any further orders/recommendations for Dr Katrinka Blazing.

## 2018-10-24 NOTE — Telephone Encounter (Signed)
Just seeing this note. F/u to see how he is doing.

## 2018-10-26 NOTE — Telephone Encounter (Signed)
lpmtcb 3/2 

## 2018-10-30 NOTE — Telephone Encounter (Signed)
Left message to call back  

## 2018-11-09 ENCOUNTER — Telehealth: Payer: Self-pay | Admitting: Interventional Cardiology

## 2018-11-10 ENCOUNTER — Ambulatory Visit: Payer: 59 | Admitting: Interventional Cardiology

## 2018-11-13 NOTE — Telephone Encounter (Signed)
Delete.

## 2018-12-23 ENCOUNTER — Ambulatory Visit (INDEPENDENT_AMBULATORY_CARE_PROVIDER_SITE_OTHER): Payer: 59 | Admitting: *Deleted

## 2018-12-23 ENCOUNTER — Other Ambulatory Visit: Payer: Self-pay

## 2018-12-23 DIAGNOSIS — I5022 Chronic systolic (congestive) heart failure: Secondary | ICD-10-CM | POA: Diagnosis not present

## 2018-12-23 DIAGNOSIS — I255 Ischemic cardiomyopathy: Secondary | ICD-10-CM

## 2018-12-23 LAB — CUP PACEART REMOTE DEVICE CHECK
Battery Remaining Longevity: 110 mo
Battery Voltage: 3.02 V
Brady Statistic RV Percent Paced: 0.29 %
Date Time Interrogation Session: 20200429052205
HighPow Impedance: 42 Ohm
HighPow Impedance: 54 Ohm
Implantable Lead Implant Date: 20070921
Implantable Lead Location: 753860
Implantable Lead Model: 6947
Implantable Pulse Generator Implant Date: 20170111
Lead Channel Impedance Value: 418 Ohm
Lead Channel Impedance Value: 513 Ohm
Lead Channel Pacing Threshold Amplitude: 0.375 V
Lead Channel Pacing Threshold Pulse Width: 0.4 ms
Lead Channel Sensing Intrinsic Amplitude: 20.875 mV
Lead Channel Sensing Intrinsic Amplitude: 20.875 mV
Lead Channel Setting Pacing Amplitude: 2.5 V
Lead Channel Setting Pacing Pulse Width: 0.4 ms
Lead Channel Setting Sensing Sensitivity: 0.3 mV

## 2018-12-31 ENCOUNTER — Telehealth: Payer: Self-pay | Admitting: Interventional Cardiology

## 2018-12-31 NOTE — Progress Notes (Signed)
Remote ICD transmission.   

## 2018-12-31 NOTE — Telephone Encounter (Signed)
Patient set up for MyChart?  Yes - consent through to my chart   Is patient using Smartphone/computer/tablet smartphone  Did audio/video work  Does patient need telephone visit?no  Best phone number to use? [phone]  Special Instructions? Patient will have vitals      Virtual Visit Pre-Appointment Phone Call  "(Name), I am calling you today to discuss your upcoming appointment. We are currently trying to limit exposure to the virus that causes COVID-19 by seeing patients at home rather than in the office."  1. "What is the BEST phone number to call the day of the visit?" - include this in appointment notes  2. Do you have or have access to (through a family member/friend) a smartphone with video capability that we can use for your visit?" a. If yes - list this number in appt notes as cell (if different from BEST phone #) and list the appointment type as a VIDEO visit in appointment notes b. If no - list the appointment type as a PHONE visit in appointment notes  3. Confirm consent - "In the setting of the current Covid19 crisis, you are scheduled for a (phone or video) visit with your provider on (date) at (time).  Just as we do with many in-office visits, in order for you to participate in this visit, we must obtain consent.  If you'd like, I can send this to your mychart (if signed up) or email for you to review.  Otherwise, I can obtain your verbal consent now.  All virtual visits are billed to your insurance company just like a normal visit would be.  By agreeing to a virtual visit, we'd like you to understand that the technology does not allow for your provider to perform an examination, and thus may limit your provider's ability to fully assess your condition. If your provider identifies any concerns that need to be evaluated in person, we will make arrangements to do so.  Finally, though the technology is pretty good, we cannot assure that it will always work on either your or  our end, and in the setting of a video visit, we may have to convert it to a phone-only visit.  In either situation, we cannot ensure that we have a secure connection.  Are you willing to proceed?" STAFF: Did the patient verbally acknowledge consent to telehealth visit? Document YES/NO here: yes  4. Advise patient to be prepared - "Two hours prior to your appointment, go ahead and check your blood pressure, pulse, oxygen saturation, and your weight (if you have the equipment to check those) and write them all down. When your visit starts, your provider will ask you for this information. If you have an Apple Watch or Kardia device, please plan to have heart rate information ready on the day of your appointment. Please have a pen and paper handy nearby the day of the visit as well."  5. Give patient instructions for MyChart download to smartphone OR Doximity/Doxy.me as below if video visit (depending on what platform provider is using)  6. Inform patient they will receive a phone call 15 minutes prior to their appointment time (may be from unknown caller ID) so they should be prepared to answer    TELEPHONE CALL NOTE  [name] [name] has been deemed a candidate for a follow-up tele-health visit to limit community exposure during the Covid-19 pandemic. I spoke with the patient via phone to ensure availability of phone/video source, confirm preferred email & phone  number, and discuss instructions and expectations.  I reminded Dennis BastWayne T Moura to be prepared with any vital sign and/or heart rhythm information that could potentially be obtained via home monitoring, at the time of his visit. I reminded Dennis BastWayne T Pakula to expect a phone call prior to his visit.  Berle Mullorma J Miller 12/31/2018 3:22 PM   INSTRUCTIONS FOR DOWNLOADING THE MYCHART APP TO SMARTPHONE  - The patient must first make sure to have activated MyChart and know their login information - If Apple, go to [employer] Store and type in MyChart in  the search bar and download the app. If Android, ask patient to go to [employer] Play Store and type in [place] in the search bar and download the app. The app is free but as with any other app downloads, their phone may require them to verify saved payment information or Apple/Android password.  - The patient will need to then log into the app with their MyChart username and password, and select [facility] as their healthcare provider to link the account. When it is time for your visit, go to the MyChart app, find appointments, and click Begin Video Visit. Be sure to Select Allow for your device to access the Microphone and Camera for your visit. You will then be connected, and your provider will be with you shortly.  **If they have any issues connecting, or need assistance please contact MyChart service desk [phone])**  **If using a computer, in order to ensure the best quality for their visit they will need to use either of the following Internet Browsers: [employer] Edge, or Google Chrome**  IF USING DOXIMITY or DOXY.ME - The patient will receive a link just prior to their visit by text.     FULL LENGTH CONSENT FOR TELE-HEALTH VISIT   I hereby voluntarily request, consent and authorize [facility] and its employed or contracted physicians, physician assistants, nurse practitioners or other licensed health care professionals (the Practitioner), to provide me with telemedicine health care services (the Services") as deemed necessary by the treating Practitioner. I acknowledge and consent to receive the Services by the Practitioner via telemedicine. I understand that the telemedicine visit will involve communicating with the Practitioner through live audiovisual communication technology and the disclosure of certain medical information by electronic transmission. I acknowledge that I have been given the opportunity to request an in-person assessment or other available alternative  prior to the telemedicine visit and am voluntarily participating in the telemedicine visit.  I understand that I have the right to withhold or withdraw my consent to the use of telemedicine in the course of my care at any time, without affecting my right to future care or treatment, and that the Practitioner or I may terminate the telemedicine visit at any time. I understand that I have the right to inspect all information obtained and/or recorded in the course of the telemedicine visit and may receive copies of available information for a reasonable fee.  I understand that some of the potential risks of receiving the Services via telemedicine include:   Delay or interruption in medical evaluation due to technological equipment failure or disruption;  Information transmitted may not be sufficient (e.g. poor resolution of images) to allow for appropriate medical decision making by the Practitioner; and/or   In rare instances, security protocols could fail, causing a breach of personal health information.  Furthermore, I acknowledge that it is my responsibility to provide information about my medical history, conditions and care that is  complete and accurate to the best of my ability. I acknowledge that Practitioner's advice, recommendations, and/or decision may be based on factors not within their control, such as incomplete or inaccurate data provided by me or distortions of diagnostic images or specimens that may result from electronic transmissions. I understand that the practice of medicine is not an exact science and that Practitioner makes no warranties or guarantees regarding treatment outcomes. I acknowledge that I will receive a copy of this consent concurrently upon execution via email to the email address I last provided but may also request a printed copy by calling the office of [place].    I understand that my insurance will be billed for this visit.   I have read or had this  consent read to me.  I understand the contents of this consent, which adequately explains the benefits and risks of the Services being provided via telemedicine.   I have been provided ample opportunity to ask questions regarding this consent and the Services and have had my questions answered to my satisfaction.  I give my informed consent for the services to be provided through the use of telemedicine in my medical care  By participating in this telemedicine visit I agree to the above.

## 2019-01-04 NOTE — Progress Notes (Signed)
Virtual Visit via Video Note   This visit type was conducted due to national recommendations for restrictions regarding the COVID-19 Pandemic (e.g. social distancing) in an effort to limit this patient's exposure and mitigate transmission in our community.  Due to his co-morbid illnesses, this patient is at least at moderate risk for complications without adequate follow up.  This format is felt to be most appropriate for this patient at this time.  All issues noted in this document were discussed and addressed.  A limited physical exam was performed with this format.  Please refer to the patient's chart for his consent to telehealth for [facility].   Date:  01/06/2019   ID:  [name] [name] Apr 07, 1961, MRN [id number]  Patient Location: Home Provider Location: Home  PCP:  [name], MD  Cardiologist:  Lesleigh Noe, MD  Electrophysiologist:  None   Evaluation Performed:  Follow-Up Visit  Chief Complaint:  CAD/CHF  History of Present Illness:    [name] [name] is a 58 y.o. male with a hx of ostial LAD Taxus DES stent, subsequent acute stent thrombosis 2007, emergency coronary bypass grafting with saphenous vein grafts 2007, chronic systolic heart failure with EF 25-30% January 2016, hyperlipidemia, and AICD. Conversion to Entresto fall 2019.  Overall doing well but having occasional brief dyspnea.  The dyspnea is not exertion related.  There is no orthopnea, or PND.  He denies angina.  There is no lower extremity edema or abdominal swelling.  Exertional tolerance has not changed.  He is sleeping well.  No medication side effect.  The patient does not have symptoms concerning for COVID-19 infection (fever, chills, cough, or new shortness of breath).    Past Medical History:  Diagnosis Date  . CAD (coronary artery disease)    Anterior MI with emergent PCI followed by emergent CABG x 3 in April of 2007 per Dr. Dorris Fetch with SVG to LAD and SVG to ramus & OM1   . Chronic systolic heart failure (HCC)   . Hyperlipidemia   . ICD (implantable cardioverter-defibrillator) in place   . MI (myocardial infarction) (HCC) 11/2005   Past Surgical History:  Procedure Laterality Date  . CARDIAC CATHETERIZATION  2004, 2007  . CARDIAC DEFIBRILLATOR PLACEMENT  04/2006   [employer] [facility]-Dr. Amil Amen  . CAROTID STENT INSERTION    . CHOLECYSTECTOMY    . CORONARY ARTERY BYPASS GRAFT  11/2005  . EP IMPLANTABLE DEVICE N/A 09/06/2015   Procedure: ICD Generator Changeout;  Surgeon: Marinus Maw, MD;  [facility] INVASIVE CV LAB;  Service: Cardiovascular;  Laterality: N/A;  . VASECTOMY       Current Meds  Medication Sig  . aspirin 81 MG EC tablet Take 1 tablet (81 mg total) by mouth daily.  . carvedilol (COREG) 12.5 MG tablet TAKE 1 TABLET (12.5 MG TOTAL) BY MOUTH 2 (TWO) TIMES DAILY WITH A MEAL.  Marland Kitchen CRESTOR 40 MG tablet Take 20 mg by mouth daily.  . diphenoxylate-atropine (LOMOTIL) 2.5-0.025 MG per tablet Take 1 tablet by mouth 4 (four) times daily as needed for diarrhea or loose stools.   . fish oil-omega-3 fatty acids 1000 MG capsule Take 1 g by mouth daily.   Marland Kitchen levothyroxine (SYNTHROID, LEVOTHROID) 25 MCG tablet Take 25 mcg by mouth daily.  . Melatonin 5 MG TABS Take 5 mg by mouth as needed.   . mirtazapine (REMERON) 30 MG tablet Take 30 mg by mouth at bedtime.  . nitroGLYCERIN (NITROSTAT) 0.4 MG SL tablet  Place 1 tablet (0.4 mg total) under the tongue every 5 (five) minutes as needed for chest pain. Please make appt with Dr. Katrinka BlazingSmith.  Marland Kitchen. PRADAXA 150 MG CAPS capsule TAKE 1 CAPSULE BY MOUTH TWICE A DAY  . promethazine (PHENERGAN) 25 MG tablet Take 25 mg by mouth every 6 (six) hours as needed for nausea or vomiting.  . sacubitril-valsartan (ENTRESTO) 49-51 MG Take 1 tablet by mouth 2 (two) times daily.     Allergies:   Adhesive [tape]; Eggs or egg-derived products; and Penicillins   Social History   Tobacco Use  . Smoking status: Never Smoker  . Smokeless  tobacco: Never Used  Substance Use Topics  . Alcohol use: No    Alcohol/week: 0.0 standard drinks  . Drug use: No     Family Hx: The patient's family history includes Heart attack in his father.  ROS:   Please see the history of present illness.    [name] is been having joint discomfort, thumbs, wrists, etc.  He wonders if any of the current medications could be contributing. All other systems reviewed and are negative.   Prior CV studies:   The following studies were reviewed today:  Most recent echocardiogram in January 2020 demonstrated EF 20 to 25%, mild mitral regurgitation.  No significant pulmonary hypertension.  Labs/Other Tests and Data Reviewed:    EKG:  No ECG reviewed.  Recent Labs: 08/05/2018: BUN 14; Creatinine, Ser 1.13; Potassium 4.0; Sodium 142   Recent Lipid Panel No results found for: CHOL, TRIG, HDL, CHOLHDL, LDLCALC, LDLDIRECT  Wt Readings from Last 3 Encounters:  01/06/19 215 lb (97.5 kg)  07/21/18 215 lb 12.8 oz (97.9 kg)  06/04/18 214 lb 9.6 oz (97.3 kg)     Objective:    Vital Signs:  BP 115/78   Pulse 74   Ht 6' (1.829 m)   Wt 215 lb (97.5 kg)   BMI 29.16 kg/m    VITAL SIGNS:  reviewed GEN:  no acute distress EYES:  sclerae anicteric, EOMI - Extraocular Movements Intact RESPIRATORY:  normal respiratory effort, symmetric expansion CARDIOVASCULAR:  no peripheral edema NEURO:  alert and oriented x 3, no obvious focal deficit  ASSESSMENT & PLAN:    1. Chronic systolic heart failure (HCC)   2. Automatic implantable cardioverter-defibrillator in situ   3. Other hyperlipidemia   4. Coronary artery disease involving coronary bypass graft of native heart without angina pectoris   5. Paroxysmal atrial fibrillation (HCC)   6. Chronic anticoagulation   7. Essential hypertension   8. Educated About Covid-19 Virus Infection    PLAN:  1. Current guideline directed regimen for heart failure is being well tolerated.  A potential future add on  is SGLT2 therapy.  Having brief episodes of dyspnea which could represent arrhythmia or ischemic equivalents. 2. Telemetry demonstrates increasing episodes of atrial fibrillation with the most recent download in April with a 3-1/2-hour episode of PAF.  I have asked the patient to continue to identify when he has the symptom of dyspnea that occurs spontaneously.  Considerations would be arrhythmia or dyspnea as an ischemic equivalent. 3. LDL should be less than 70 4. Secondary risk prevention discussed including aerobic activity metric 5. Screen stools and urine for blood  Overall education and awareness concerning primary/secondary risk prevention was discussed in detail: LDL less than 70, hemoglobin A1c less than 7, blood pressure target less than 130/80 mmHg, >150 minutes of moderate aerobic activity per week, avoidance of smoking, weight control (via diet  and exercise), and continued surveillance/management of/for obstructive sleep apnea.    COVID-19 Education: The signs and symptoms of COVID-19 were discussed with the patient and how to seek care for testing (follow up with PCP or arrange E-visit).  The importance of social distancing was discussed today.  Time:   Today, I have spent 20 minutes with the patient with telehealth technology discussing the above problems.     Medication Adjustments/Labs and Tests Ordered: Current medicines are reviewed at length with the patient today.  Concerns regarding medicines are outlined above.   Tests Ordered: No orders of the defined types were placed in this encounter.   Medication Changes: No orders of the defined types were placed in this encounter.   Disposition:  Follow up in 6 month(s)  Signed, Lesleigh Noe, MD  01/06/2019 9:27 AM    [facility]

## 2019-01-06 ENCOUNTER — Telehealth (INDEPENDENT_AMBULATORY_CARE_PROVIDER_SITE_OTHER): Payer: 59 | Admitting: Interventional Cardiology

## 2019-01-06 ENCOUNTER — Encounter: Payer: Self-pay | Admitting: Interventional Cardiology

## 2019-01-06 ENCOUNTER — Other Ambulatory Visit: Payer: Self-pay

## 2019-01-06 ENCOUNTER — Ambulatory Visit: Payer: 59 | Admitting: Interventional Cardiology

## 2019-01-06 VITALS — BP 115/78 | HR 74 | Ht 72.0 in | Wt 215.0 lb

## 2019-01-06 DIAGNOSIS — Z95810 Presence of automatic (implantable) cardiac defibrillator: Secondary | ICD-10-CM

## 2019-01-06 DIAGNOSIS — Z7189 Other specified counseling: Secondary | ICD-10-CM | POA: Insufficient documentation

## 2019-01-06 DIAGNOSIS — I5022 Chronic systolic (congestive) heart failure: Secondary | ICD-10-CM | POA: Diagnosis not present

## 2019-01-06 DIAGNOSIS — E7849 Other hyperlipidemia: Secondary | ICD-10-CM

## 2019-01-06 DIAGNOSIS — I480 Paroxysmal atrial fibrillation: Secondary | ICD-10-CM

## 2019-01-06 DIAGNOSIS — I25810 Atherosclerosis of coronary artery bypass graft(s) without angina pectoris: Secondary | ICD-10-CM

## 2019-01-06 DIAGNOSIS — I10 Essential (primary) hypertension: Secondary | ICD-10-CM

## 2019-01-06 DIAGNOSIS — Z7901 Long term (current) use of anticoagulants: Secondary | ICD-10-CM

## 2019-01-06 NOTE — Patient Instructions (Signed)

## 2019-01-12 ENCOUNTER — Other Ambulatory Visit: Payer: Self-pay

## 2019-01-12 ENCOUNTER — Telehealth (INDEPENDENT_AMBULATORY_CARE_PROVIDER_SITE_OTHER): Payer: 59 | Admitting: Internal Medicine

## 2019-01-12 ENCOUNTER — Telehealth: Payer: Self-pay | Admitting: Interventional Cardiology

## 2019-01-12 DIAGNOSIS — Z95810 Presence of automatic (implantable) cardiac defibrillator: Secondary | ICD-10-CM | POA: Diagnosis not present

## 2019-01-12 DIAGNOSIS — I480 Paroxysmal atrial fibrillation: Secondary | ICD-10-CM

## 2019-01-12 DIAGNOSIS — I5022 Chronic systolic (congestive) heart failure: Secondary | ICD-10-CM

## 2019-01-12 NOTE — Progress Notes (Signed)
Electrophysiology TeleHealth Note   Due to national recommendations of social distancing due to COVID 19, an audio/video telehealth visit is felt to be most appropriate for this patient at this time.  See MyChart message from today for the patient's consent to telehealth for [name].   Date:  01/12/2019   ID:  [name] [name], DOB 12/06/1960, MRN [id number]  Location: patient's home  Provider location: [address], [place]  Evaluation Performed: Follow-up visit  PCP:  Johny BlamerHarris, William, MD  Cardiologist:  Lesleigh NoeHenry W Smith III, MD  Electrophysiologist:  Dr Ladona Ridgelaylor  Chief Complaint:   History of Present Illness:    [name] [name] is a 58 y.o. male who presents via audio/video conferencing for a telehealth visit today.  Since last being seen in our clinic, the patient reports doing very well.  Today, he denies symptoms of palpitations, chest pain, shortness of breath,  lower extremity edema, dizziness, presyncope, or syncope.  The patient is otherwise without complaint today.  The patient denies symptoms of fevers, chills, cough, or new SOB worrisome for COVID 19.  Past Medical History:  Diagnosis Date  . CAD (coronary artery disease)    Anterior MI with emergent PCI followed by emergent CABG x 3 in April of 2007 per Dr. Dorris FetchHendrickson with SVG to LAD and SVG to ramus & OM1  . Chronic systolic heart failure (HCC)   . Hyperlipidemia   . ICD (implantable cardioverter-defibrillator) in place   . MI (myocardial infarction) (HCC) 11/2005    Past Surgical History:  Procedure Laterality Date  . CARDIAC CATHETERIZATION  2004, 2007  . CARDIAC DEFIBRILLATOR PLACEMENT  04/2006   Medtronic Virtuoso-Dr. Amil AmenEdmunds  . CAROTID STENT INSERTION    . CHOLECYSTECTOMY    . CORONARY ARTERY BYPASS GRAFT  11/2005  . EP IMPLANTABLE DEVICE N/A 09/06/2015   Procedure: ICD Generator Changeout;  Surgeon: Marinus MawGregg W , MD;  [facility];  Service: Cardiovascular;   Laterality: N/A;  . VASECTOMY      Current Outpatient Medications  Medication Sig Dispense Refill  . aspirin 81 MG EC tablet Take 1 tablet (81 mg total) by mouth daily.    . carvedilol (COREG) 12.5 MG tablet TAKE 1 TABLET (12.5 MG TOTAL) BY MOUTH 2 (TWO) TIMES DAILY WITH A MEAL. 180 tablet 3  . CRESTOR 40 MG tablet Take 20 mg by mouth daily.  5  . diphenoxylate-atropine (LOMOTIL) 2.5-0.025 MG per tablet Take 1 tablet by mouth 4 (four) times daily as needed for diarrhea or loose stools.     . fish oil-omega-3 fatty acids 1000 MG capsule Take 1 g by mouth daily.     Marland Kitchen. levothyroxine (SYNTHROID, LEVOTHROID) 25 MCG tablet Take 25 mcg by mouth daily.    . Melatonin 5 MG TABS Take 5 mg by mouth as needed.     . mirtazapine (REMERON) 30 MG tablet Take 30 mg by mouth at bedtime.    . nitroGLYCERIN (NITROSTAT) 0.4 MG SL tablet Place 1 tablet (0.4 mg total) under the tongue every 5 (five) minutes as needed for chest pain. Please make appt with Dr. Katrinka BlazingSmith. 25 tablet 2  . PRADAXA 150 MG CAPS capsule TAKE 1 CAPSULE BY MOUTH TWICE A DAY 60 capsule 10  . promethazine (PHENERGAN) 25 MG tablet Take 25 mg by mouth every 6 (six) hours as needed for nausea or vomiting.    . sacubitril-valsartan (ENTRESTO) 49-51 MG Take 1 tablet by mouth 2 (two) times  daily. 60 tablet 11   No current facility-administered medications for this visit.     Allergies:   Adhesive [tape]; Eggs or egg-derived products; and Penicillins   Social History:  The patient  reports that he has never smoked. He has never used smokeless tobacco. He reports that he does not drink alcohol or use drugs.   Family History:  The patient's  family history includes Heart attack in his father.   ROS:  Please see the history of present illness.   All other systems are personally reviewed and negative.    Exam:    Vital Signs:  BP - 130/78, 56/min, Wt - 213 lb Well appearing, alert and conversant, regular work of breathing,  good skin color Eyes-  anicteric, neuro- grossly intact, skin- no apparent rash or lesions or cyanosis, mouth- oral mucosa is pink   Labs/Other Tests and Data Reviewed:    Recent Labs: 08/05/2018: BUN 14; Creatinine, Ser 1.13; Potassium 4.0; Sodium 142   Wt Readings from Last 3 Encounters:  01/06/19 215 lb (97.5 kg)  07/21/18 215 lb 12.8 oz (97.9 kg)  06/04/18 214 lb 9.6 oz (97.3 kg)     Other studies personally reviewed:  Last device remote is reviewed from PaceART PDF dated 12/23/18 which reveals normal device function, no arrhythmias    ASSESSMENT & PLAN:    1.  Chronic systolic heart failure - his symptoms are well controlled class 2. He will continue his current meds. 2. PAF - he has not had any in several months. He will continue Pradaxa. 3. ICD - his medtronic device is working normally. 4. COVID 19 screen The patient denies symptoms of COVID 19 at this time.  The importance of social distancing was discussed today.  Follow-up:  6 months Next remote: 7/20  Current medicines are reviewed at length with the patient today.   The patient does not have concerns regarding his medicines.  The following changes were made today:  none  Labs/ tests ordered today include:none  No orders of the defined types were placed in this encounter.    Patient Risk:  after full review of this patients clinical status, I feel that they are at moderate risk at this time.  Today, I have spent 15 minutes with the patient with telehealth technology discussing all of the above.    Signed, Lewayne Bunting, MD  01/12/2019 3:22 PM     [facility] [address] [place] [address] [phone] (office) [phone] (fax)

## 2019-01-12 NOTE — Telephone Encounter (Signed)
Medical records requested from [facility]. 01/12/19 vlm

## 2019-03-01 ENCOUNTER — Other Ambulatory Visit: Payer: Self-pay

## 2019-03-01 MED ORDER — DABIGATRAN ETEXILATE MESYLATE 150 MG PO CAPS: 150.0000 mg | ORAL_CAPSULE | Freq: Two times a day (BID) | ORAL | 3 refills | Status: DC

## 2019-03-24 ENCOUNTER — Ambulatory Visit (INDEPENDENT_AMBULATORY_CARE_PROVIDER_SITE_OTHER): Payer: 59 | Admitting: *Deleted

## 2019-03-24 DIAGNOSIS — I255 Ischemic cardiomyopathy: Secondary | ICD-10-CM | POA: Diagnosis not present

## 2019-03-24 DIAGNOSIS — I5022 Chronic systolic (congestive) heart failure: Secondary | ICD-10-CM

## 2019-03-24 LAB — CUP PACEART REMOTE DEVICE CHECK
Battery Remaining Longevity: 107 mo
Battery Voltage: 3.02 V
Brady Statistic RV Percent Paced: 0.41 %
Date Time Interrogation Session: 20200729062604
HighPow Impedance: 42 Ohm
HighPow Impedance: 55 Ohm
Implantable Lead Implant Date: 20070921
Implantable Lead Location: 753860
Implantable Lead Model: 6947
Implantable Pulse Generator Implant Date: 20170111
Lead Channel Impedance Value: 418 Ohm
Lead Channel Impedance Value: 513 Ohm
Lead Channel Pacing Threshold Amplitude: 0.375 V
Lead Channel Pacing Threshold Pulse Width: 0.4 ms
Lead Channel Sensing Intrinsic Amplitude: 20.5 mV
Lead Channel Sensing Intrinsic Amplitude: 20.5 mV
Lead Channel Setting Pacing Amplitude: 2.5 V
Lead Channel Setting Pacing Pulse Width: 0.4 ms
Lead Channel Setting Sensing Sensitivity: 0.3 mV

## 2019-04-01 NOTE — Progress Notes (Signed)
Remote ICD transmission.   

## 2019-04-09 ENCOUNTER — Other Ambulatory Visit: Payer: Self-pay | Admitting: Internal Medicine

## 2019-04-09 ENCOUNTER — Other Ambulatory Visit: Payer: Self-pay

## 2019-04-09 MED ORDER — NITROGLYCERIN 0.4 MG SL SUBL: 0.4000 mg | SUBLINGUAL_TABLET | SUBLINGUAL | 2 refills | Status: DC | PRN

## 2019-06-24 ENCOUNTER — Ambulatory Visit (INDEPENDENT_AMBULATORY_CARE_PROVIDER_SITE_OTHER): Payer: 59 | Admitting: *Deleted

## 2019-06-24 DIAGNOSIS — I480 Paroxysmal atrial fibrillation: Secondary | ICD-10-CM

## 2019-06-24 DIAGNOSIS — I255 Ischemic cardiomyopathy: Secondary | ICD-10-CM

## 2019-06-24 LAB — CUP PACEART REMOTE DEVICE CHECK
Battery Remaining Longevity: 104 mo
Battery Voltage: 3.01 V
Brady Statistic RV Percent Paced: 0.55 %
Date Time Interrogation Session: 20201029062824
HighPow Impedance: 47 Ohm
HighPow Impedance: 59 Ohm
Implantable Lead Implant Date: 20070921
Implantable Lead Location: 753860
Implantable Lead Model: 6947
Implantable Pulse Generator Implant Date: 20170111
Lead Channel Impedance Value: 456 Ohm
Lead Channel Impedance Value: 532 Ohm
Lead Channel Pacing Threshold Amplitude: 0.5 V
Lead Channel Pacing Threshold Pulse Width: 0.4 ms
Lead Channel Sensing Intrinsic Amplitude: 21.375 mV
Lead Channel Setting Pacing Amplitude: 2.5 V
Lead Channel Setting Pacing Pulse Width: 0.4 ms
Lead Channel Setting Sensing Sensitivity: 0.3 mV

## 2019-06-26 ENCOUNTER — Other Ambulatory Visit: Payer: Self-pay | Admitting: Interventional Cardiology

## 2019-07-16 NOTE — Progress Notes (Signed)
Remote ICD transmission.   

## 2019-08-13 ENCOUNTER — Other Ambulatory Visit: Payer: Self-pay | Admitting: Interventional Cardiology

## 2019-09-23 ENCOUNTER — Other Ambulatory Visit: Payer: Self-pay

## 2019-09-23 ENCOUNTER — Encounter: Payer: Self-pay | Admitting: Internal Medicine

## 2019-09-23 ENCOUNTER — Ambulatory Visit (INDEPENDENT_AMBULATORY_CARE_PROVIDER_SITE_OTHER): Payer: 59 | Admitting: *Deleted

## 2019-09-23 ENCOUNTER — Ambulatory Visit: Payer: 59 | Admitting: Internal Medicine

## 2019-09-23 VITALS — BP 118/64 | HR 54 | Ht 72.0 in | Wt 227.0 lb

## 2019-09-23 DIAGNOSIS — I480 Paroxysmal atrial fibrillation: Secondary | ICD-10-CM

## 2019-09-23 DIAGNOSIS — I5022 Chronic systolic (congestive) heart failure: Secondary | ICD-10-CM | POA: Diagnosis not present

## 2019-09-23 DIAGNOSIS — I255 Ischemic cardiomyopathy: Secondary | ICD-10-CM

## 2019-09-23 DIAGNOSIS — Z95810 Presence of automatic (implantable) cardiac defibrillator: Secondary | ICD-10-CM

## 2019-09-23 LAB — CUP PACEART REMOTE DEVICE CHECK
Battery Remaining Longevity: 100 mo
Battery Voltage: 3.01 V
Brady Statistic RV Percent Paced: 0.48 %
Date Time Interrogation Session: 20210128043823
HighPow Impedance: 43 Ohm
HighPow Impedance: 52 Ohm
Implantable Lead Implant Date: 20070921
Implantable Lead Location: 753860
Implantable Lead Model: 6947
Implantable Pulse Generator Implant Date: 20170111
Lead Channel Impedance Value: 418 Ohm
Lead Channel Impedance Value: 513 Ohm
Lead Channel Pacing Threshold Amplitude: 0.375 V
Lead Channel Pacing Threshold Pulse Width: 0.4 ms
Lead Channel Sensing Intrinsic Amplitude: 21.5 mV
Lead Channel Sensing Intrinsic Amplitude: 21.5 mV
Lead Channel Setting Pacing Amplitude: 2.5 V
Lead Channel Setting Pacing Pulse Width: 0.4 ms
Lead Channel Setting Sensing Sensitivity: 0.3 mV

## 2019-09-23 NOTE — Progress Notes (Signed)
ICD Remote  

## 2019-09-23 NOTE — Progress Notes (Signed)
HPI [name] returns today for ICD followup. He is a pleasant 59 yo man with a h/o CAD ,s/p MI, CABG, with persistent LV dysfunction and class 2 CHF, s/p ICD insertion. In the interim, he notes that he has been very busy at work. 10 months ago, his wife came home from a large meeting in [place] and they both began to feel badly and she lost her sense of smell. He had fever and diarrhea and nausea. He has not had any ICD therapies. Allergies  Allergen Reactions  . Adhesive [Tape] Other (See Comments)    REACTION: "IT BURNS ME"  . Eggs Or Egg-Derived Products Nausea And Vomiting  . Penicillins Rash    Has patient had a PCN reaction causing immediate rash, facial/tongue/throat swelling, SOB or lightheadedness with hypotension: Yes Has patient had a PCN reaction causing severe rash involving mucus membranes or skin necrosis: No Has patient had a PCN reaction that required hospitalization No Has patient had a PCN reaction occurring within the last 10 years: No If all of the above answers are "NO", then may proceed with Cephalosporin use.      Current Outpatient Medications  Medication Sig Dispense Refill  . aspirin 81 MG EC tablet Take 1 tablet (81 mg total) by mouth daily.    . carvedilol (COREG) 12.5 MG tablet TAKE 1 TABLET (12.5 MG TOTAL) BY MOUTH 2 (TWO) TIMES DAILY WITH A MEAL. 180 tablet 3  . CRESTOR 40 MG tablet Take 20 mg by mouth daily.  5  . dabigatran (PRADAXA) 150 MG CAPS capsule Take 1 capsule (150 mg total) by mouth 2 (two) times daily. 180 capsule 3  . diphenoxylate-atropine (LOMOTIL) 2.5-0.025 MG per tablet Take 1 tablet by mouth 4 (four) times daily as needed for diarrhea or loose stools.     Marland Kitchen ENTRESTO 49-51 MG TAKE 1 TABLET BY MOUTH TWICE A DAY 60 tablet 5  . fish oil-omega-3 fatty acids 1000 MG capsule Take 1 g by mouth daily.     Marland Kitchen levothyroxine (SYNTHROID, LEVOTHROID) 25 MCG tablet Take 25 mcg by mouth daily.    . Melatonin 5 MG TABS Take 5 mg by mouth as  needed.     . mirtazapine (REMERON) 30 MG tablet Take 30 mg by mouth at bedtime.    . nitroGLYCERIN (NITROSTAT) 0.4 MG SL tablet Place 1 tablet (0.4 mg total) under the tongue every 5 (five) minutes as needed for chest pain. Please make appt with Dr. Katrinka Blazing. 25 tablet 2  . promethazine (PHENERGAN) 25 MG tablet Take 25 mg by mouth every 6 (six) hours as needed for nausea or vomiting.     No current facility-administered medications for this visit.     Past Medical History:  Diagnosis Date  . CAD (coronary artery disease)    Anterior MI with emergent PCI followed by emergent CABG x 3 in April of 2007 per Dr. Dorris Fetch with SVG to LAD and SVG to ramus & OM1  . Chronic systolic heart failure (HCC)   . Hyperlipidemia   . ICD (implantable cardioverter-defibrillator) in place   . MI (myocardial infarction) (HCC) 11/2005    ROS:   All systems reviewed and negative except as noted in the HPI.   Past Surgical History:  Procedure Laterality Date  . CARDIAC CATHETERIZATION  2004, 2007  . CARDIAC DEFIBRILLATOR PLACEMENT  04/2006   Medtronic Virtuoso-Dr. Amil Amen  . CAROTID STENT INSERTION    . CHOLECYSTECTOMY    . CORONARY  ARTERY BYPASS GRAFT  11/2005  . EP IMPLANTABLE DEVICE N/A 09/06/2015   Procedure: ICD Generator Changeout;  Surgeon: Evans Lance, MD;  Location: [facility];  Service: Cardiovascular;  Laterality: N/A;  . VASECTOMY       Family History  Problem Relation Age of Onset  . Heart attack Father      Social History   Socioeconomic History  . Marital status: Married    Spouse name: Not on file  . Number of children: Not on file  . Years of education: Not on file  . Highest education level: Not on file  Occupational History  . Not on file  Tobacco Use  . Smoking status: Never Smoker  . Smokeless tobacco: Never Used  Substance and Sexual Activity  . Alcohol use: No    Alcohol/week: 0.0 standard drinks  . Drug use: No  . Sexual activity: Not on file    Other Topics Concern  . Not on file  Social History Narrative  . Not on file   Social Determinants of Health   Financial Resource Strain:   . Difficulty of Paying Living Expenses: Not on file  Food Insecurity:   . Worried About Charity fundraiser in the Last Year: Not on file  . Ran Out of Food in the Last Year: Not on file  Transportation Needs:   . Lack of Transportation (Medical): Not on file  . Lack of Transportation (Non-Medical): Not on file  Physical Activity:   . Days of Exercise per Week: Not on file  . Minutes of Exercise per Session: Not on file  Stress:   . Feeling of Stress : Not on file  Social Connections:   . Frequency of Communication with Friends and Family: Not on file  . Frequency of Social Gatherings with Friends and Family: Not on file  . Attends Religious Services: Not on file  . Active Member of Clubs or Organizations: Not on file  . Attends Archivist Meetings: Not on file  . Marital Status: Not on file  Intimate Partner Violence:   . Fear of Current or Ex-Partner: Not on file  . Emotionally Abused: Not on file  . Physically Abused: Not on file  . Sexually Abused: Not on file     BP 118/64   Pulse (!) 54   Ht 6' (1.829 m)   Wt 227 lb (103 kg)   SpO2 97%   BMI 30.79 kg/m   Physical Exam:  Well appearing NAD HEENT: Unremarkable Neck:  No JVD, no thyromegally Lymphatics:  No adenopathy Back:  No CVA tenderness Lungs:  Clear with no wheezes HEART:  Regular rate rhythm, no murmurs, no rubs, no clicks Abd:  soft, positive bowel sounds, no organomegally, no rebound, no guarding Ext:  2 plus pulses, no edema, no cyanosis, no clubbing Skin:  No rashes no nodules Neuro:  CN II through XII intact, motor grossly intact  EKG - nsr  DEVICE  Normal device function.  See PaceArt for details.   Assess/Plan: 1. Chronic systolic heart failure - his symptoms are class 2. He will continue his current meds. 2. VT - he has had no  recurrent ICD therapy. He will continue his current meds.  3. PAF - he has been in NSR 99.4 % of the time. He will continue his current meds.  4. ICD - his single chamber ICD is working normally.   [name] [name].D.

## 2019-09-23 NOTE — Patient Instructions (Signed)

## 2019-09-28 LAB — CUP PACEART INCLINIC DEVICE CHECK
Date Time Interrogation Session: 20210128170344
Implantable Lead Implant Date: 20070921
Implantable Lead Location: 753860
Implantable Lead Model: 6947
Implantable Pulse Generator Implant Date: 20170111

## 2019-10-14 NOTE — Progress Notes (Signed)
Cardiology Office Note:    Date:  10/15/2019   ID:  [name], [name] [birth year], MRN [id number]  PCP:  [name], MD  Cardiologist:  Lesleigh Noe, MD   Referring MD: [name], MD   Chief Complaint  Patient presents with  . Coronary Artery Disease  . Congestive Heart Failure    History of Present Illness:    [name] [name] is a 59 y.o. male with a hx of ostial LAD Taxus DES stent, subsequent acute stent thrombosis 2007, emergency coronary bypass grafting with saphenous vein grafts 2007, chronic systolic heart failure with EF 25-30% January 2016, hyperlipidemia, and AICD.Conversion to Entresto fall 2019.  Winter developed severe viral-like illness last winter preceding the official onset of the pandemic.  He feels like he had Covid.  He has had no issues since.  We discussed the vaccine.  He has significant risk factors for severe disease.  I recommended he get vaccinated.  From cardiac standpoint, he has had no symptoms to suggest angina.  There has been no change in his exertional tolerance.  He denies orthopnea and PND.  He takes his medications regularly.  He is able to mow his grass and do all of the yard work the same now as he could 1 to 2 years ago.  Past Medical History:  Diagnosis Date  . CAD (coronary artery disease)    Anterior MI with emergent PCI followed by emergent CABG x 3 in April of 2007 per Dr. Dorris Fetch with SVG to LAD and SVG to ramus & OM1  . Chronic systolic heart failure (HCC)   . Hyperlipidemia   . ICD (implantable cardioverter-defibrillator) in place   . MI (myocardial infarction) (HCC) 11/2005    Past Surgical History:  Procedure Laterality Date  . CARDIAC CATHETERIZATION  2004, 2007  . CARDIAC DEFIBRILLATOR PLACEMENT  04/2006   Medtronic Virtuoso-Dr. Amil Amen  . CAROTID STENT INSERTION    . CHOLECYSTECTOMY    . CORONARY ARTERY BYPASS GRAFT  11/2005  . EP IMPLANTABLE DEVICE N/A 09/06/2015   Procedure: ICD Generator  Changeout;  Surgeon: Marinus Maw, MD;  Location: [facility] INVASIVE CV LAB;  Service: Cardiovascular;  Laterality: N/A;  . VASECTOMY      Current Medications: Current Meds  Medication Sig  . aspirin 81 MG EC tablet Take 1 tablet (81 mg total) by mouth daily.  . carvedilol (COREG) 12.5 MG tablet TAKE 1 TABLET (12.5 MG TOTAL) BY MOUTH 2 (TWO) TIMES DAILY WITH A MEAL.  Marland Kitchen CRESTOR 40 MG tablet Take 20 mg by mouth daily.  . dabigatran (PRADAXA) 150 MG CAPS capsule Take 1 capsule (150 mg total) by mouth 2 (two) times daily.  . diphenoxylate-atropine (LOMOTIL) 2.5-0.025 MG per tablet Take 1 tablet by mouth 4 (four) times daily as needed for diarrhea or loose stools.   Marland Kitchen ENTRESTO 49-51 MG TAKE 1 TABLET BY MOUTH TWICE A DAY  . fish oil-omega-3 fatty acids 1000 MG capsule Take 1 g by mouth daily.   Marland Kitchen levothyroxine (SYNTHROID, LEVOTHROID) 25 MCG tablet Take 25 mcg by mouth daily.  . Melatonin 5 MG TABS Take 5 mg by mouth as needed.   . mirtazapine (REMERON) 30 MG tablet Take 30 mg by mouth at bedtime.  . nitroGLYCERIN (NITROSTAT) 0.4 MG SL tablet Place 1 tablet (0.4 mg total) under the tongue every 5 (five) minutes as needed for chest pain. Please make appt with Dr. Katrinka Blazing.  . promethazine (PHENERGAN) 25 MG tablet Take 25 mg  by mouth every 6 (six) hours as needed for nausea or vomiting.     Allergies:   Adhesive [tape], Eggs or egg-derived products, and Penicillins   Social History   Socioeconomic History  . Marital status: Married    Spouse name: Not on file  . Number of children: Not on file  . Years of education: Not on file  . Highest education level: Not on file  Occupational History  . Not on file  Tobacco Use  . Smoking status: Never Smoker  . Smokeless tobacco: Never Used  Substance and Sexual Activity  . Alcohol use: No    Alcohol/week: 0.0 standard drinks  . Drug use: No  . Sexual activity: Not on file  Other Topics Concern  . Not on file  Social History Narrative  . Not on file     Social Determinants of Health   Financial Resource Strain:   . Difficulty of Paying Living Expenses: Not on file  Food Insecurity:   . Worried About Charity fundraiser in the Last Year: Not on file  . Ran Out of Food in the Last Year: Not on file  Transportation Needs:   . Lack of Transportation (Medical): Not on file  . Lack of Transportation (Non-Medical): Not on file  Physical Activity:   . Days of Exercise per Week: Not on file  . Minutes of Exercise per Session: Not on file  Stress:   . Feeling of Stress : Not on file  Social Connections:   . Frequency of Communication with Friends and Family: Not on file  . Frequency of Social Gatherings with Friends and Family: Not on file  . Attends Religious Services: Not on file  . Active Member of Clubs or Organizations: Not on file  . Attends Archivist Meetings: Not on file  . Marital Status: Not on file     Family History: The patient's family history includes Heart attack in his father.  ROS:   Please see the history of present illness.    He is uncertain about whether to take the vaccine.  He is concerned about whether or not something could be developing and we not know about it.  Last cardiac evaluation was 2017.  He had one episode while working in his yard this past summer where he had "tunnel vision".  He had to sit down and it finally got better.  There were no palpitations or other complaints.  All other systems reviewed and are negative.  EKGs/Labs/Other Studies Reviewed:    The following studies were reviewed today: No functional or imaging data recently.  EKG:  EKG left bundle with left axis deviation.Sinus rhythm.  Heart rate 54.  No pacing noted.  Recent Labs: No results found for requested labs within last 8760 hours.  Recent Lipid Panel No results found for: CHOL, TRIG, HDL, CHOLHDL, VLDL, LDLCALC, LDLDIRECT  Physical Exam:    VS:  BP 110/68   Pulse 65   Ht 6' (1.829 m)   Wt 228 lb (103.4  kg)   SpO2 96%   BMI 30.92 kg/m     Wt Readings from Last 3 Encounters:  10/15/19 228 lb (103.4 kg)  09/23/19 227 lb (103 kg)  01/06/19 215 lb (97.5 kg)     GEN: Gaining weight especially abdominal location.. No acute distress HEENT: Normal NECK: No JVD. LYMPHATICS: No lymphadenopathy CARDIAC:  RRR without murmur, gallop, or edema. VASCULAR:  Normal Pulses. No bruits. RESPIRATORY:  Clear to auscultation  without rales, wheezing or rhonchi  ABDOMEN: Soft, non-tender, non-distended, No pulsatile mass, MUSCULOSKELETAL: No deformity  SKIN: Warm and dry NEUROLOGIC:  Alert and oriented x 3 PSYCHIATRIC:  Normal affect   ASSESSMENT:    1. Chronic systolic heart failure (HCC)   2. Paroxysmal atrial fibrillation (HCC)   3. ICD (implantable cardioverter-defibrillator) in place   4. Coronary artery disease involving coronary bypass graft of native heart without angina pectoris   5. Chronic anticoagulation   6. Essential hypertension   7. Educated about COVID-19 virus infection    PLAN:    In order of problems listed above:  1. No evidence of volume overload.  On reasonable heart failure therapy.  2D Doppler echocardiogram needs to be done to reassess LV function. 2. He is on anticoagulation.  A. fib has been identified on telemetry from his defibrillator. 3. Followed by Dr. [name]. 4. Secondary prevention discussed.  Surveillance imaging of the heart will be done. 5. Continue Pradaxa.  He was unable to tolerate Eliquis. 6. Excellent blood pressure control 7. We discussed the COVID-19 vaccine which I strongly recommended since he is a person with significant risk factors for severe disease and death should he get COVID-19.  He is practicing 3W's.  Overall education and awareness concerning primary/secondary risk prevention was discussed in detail: LDL less than 70, hemoglobin A1c less than 7, blood pressure target less than 130/80 mmHg, >150 minutes of moderate aerobic activity per  week, avoidance of smoking, weight control (via diet and exercise), and continued surveillance/management of/for obstructive sleep apnea.    Medication Adjustments/Labs and Tests Ordered: Current medicines are reviewed at length with the patient today.  Concerns regarding medicines are outlined above.  No orders of the defined types were placed in this encounter.  No orders of the defined types were placed in this encounter.   There are no Patient Instructions on file for this visit.   Signed, Lesleigh Noe, MD  10/15/2019 4:19 PM    [facility]

## 2019-10-15 ENCOUNTER — Ambulatory Visit: Payer: 59 | Admitting: Interventional Cardiology

## 2019-10-15 ENCOUNTER — Encounter: Payer: Self-pay | Admitting: *Deleted

## 2019-10-15 ENCOUNTER — Encounter: Payer: Self-pay | Admitting: Interventional Cardiology

## 2019-10-15 ENCOUNTER — Other Ambulatory Visit: Payer: Self-pay

## 2019-10-15 VITALS — BP 110/68 | HR 65 | Ht 72.0 in | Wt 228.0 lb

## 2019-10-15 DIAGNOSIS — I5022 Chronic systolic (congestive) heart failure: Secondary | ICD-10-CM | POA: Diagnosis not present

## 2019-10-15 DIAGNOSIS — Z95810 Presence of automatic (implantable) cardiac defibrillator: Secondary | ICD-10-CM

## 2019-10-15 DIAGNOSIS — I10 Essential (primary) hypertension: Secondary | ICD-10-CM

## 2019-10-15 DIAGNOSIS — Z7189 Other specified counseling: Secondary | ICD-10-CM

## 2019-10-15 DIAGNOSIS — I25810 Atherosclerosis of coronary artery bypass graft(s) without angina pectoris: Secondary | ICD-10-CM

## 2019-10-15 DIAGNOSIS — I480 Paroxysmal atrial fibrillation: Secondary | ICD-10-CM

## 2019-10-15 DIAGNOSIS — Z7901 Long term (current) use of anticoagulants: Secondary | ICD-10-CM

## 2019-10-15 NOTE — Patient Instructions (Signed)
Medication Instructions:  Your physician recommends that you continue on your current medications as directed. Please refer to the Current Medication list given to you today.  *If you need a refill on your cardiac medications before your next appointment, please call your pharmacy*  Lab Work: None If you have labs (blood work) drawn today and your tests are completely normal, you will receive your results only by: Marland Kitchen MyChart Message (if you have MyChart) OR . A paper copy in the mail If you have any lab test that is abnormal or we need to change your treatment, we will call you to review the results.  Testing/Procedures: Your physician has requested that you have a lexiscan myoview. For further information please visit [email or web address] Please follow instruction sheet, as given.  Your physician has requested that you have an echocardiogram. Echocardiography is a painless test that uses sound waves to create images of your heart. It provides your doctor with information about the size and shape of your heart and how well your heart's chambers and valves are working. This procedure takes approximately one hour. There are no restrictions for this procedure.   Follow-Up: At [facility], you and your health needs are our priority.  As part of our continuing mission to provide you with exceptional heart care, we have created designated Provider Care Teams.  These Care Teams include your primary Cardiologist (physician) and Advanced Practice Providers (APPs -  Physician Assistants and Nurse Practitioners) who all work together to provide you with the care you need, when you need it.  Your next appointment:   12 month(s)  The format for your next appointment:   In Person  Provider:   You may see Lesleigh Noe, MD or one of the following Advanced Practice Providers on your designated Care Team:    Norma Fredrickson, NP  Nada Boozer, NP  Georgie Chard, NP   Other Instructions

## 2019-10-27 ENCOUNTER — Encounter (HOSPITAL_COMMUNITY): Payer: Self-pay | Admitting: *Deleted

## 2019-10-27 ENCOUNTER — Telehealth (HOSPITAL_COMMUNITY): Payer: Self-pay | Admitting: *Deleted

## 2019-10-27 NOTE — Telephone Encounter (Signed)
Patient given detailed instructions per Myocardial Perfusion Study Information Sheet for the test on 11/03/2019 at 1015. Patient notified to arrive 15 minutes early and that it is imperative to arrive on time for appointment to keep from having the test rescheduled.  If you need to cancel or reschedule your appointment, please call the office within 24 hours of your appointment. . Patient verbalized understanding.Suhayb Anzalone, Adelene Idler Mychart letter sent with instructions

## 2019-11-03 ENCOUNTER — Ambulatory Visit (HOSPITAL_COMMUNITY): Payer: 59 | Attending: Cardiovascular Disease

## 2019-11-03 ENCOUNTER — Other Ambulatory Visit: Payer: Self-pay

## 2019-11-03 ENCOUNTER — Ambulatory Visit (HOSPITAL_BASED_OUTPATIENT_CLINIC_OR_DEPARTMENT_OTHER): Payer: 59

## 2019-11-03 DIAGNOSIS — I25810 Atherosclerosis of coronary artery bypass graft(s) without angina pectoris: Secondary | ICD-10-CM | POA: Insufficient documentation

## 2019-11-03 DIAGNOSIS — I5022 Chronic systolic (congestive) heart failure: Secondary | ICD-10-CM | POA: Diagnosis present

## 2019-11-03 LAB — MYOCARDIAL PERFUSION IMAGING
LV dias vol: 317 mL (ref 62–150)
LV sys vol: 250 mL
Peak HR: 88 {beats}/min
Rest HR: 59 {beats}/min
SDS: 2
SRS: 11
SSS: 14
TID: 0.99

## 2019-11-03 LAB — ECHOCARDIOGRAM COMPLETE
Height: 72 in
Weight: 3648 oz

## 2019-11-03 MED ORDER — TECHNETIUM TC 99M TETROFOSMIN IV KIT
32.6000 | PACK | Freq: Once | INTRAVENOUS | Status: AC | PRN
  Administered 2019-11-03: 32.6 via INTRAVENOUS
  Filled 2019-11-03: qty 33

## 2019-11-03 MED ORDER — TECHNETIUM TC 99M TETROFOSMIN IV KIT
11.0000 | PACK | Freq: Once | INTRAVENOUS | Status: AC | PRN
  Administered 2019-11-03: 11 via INTRAVENOUS
  Filled 2019-11-03: qty 11

## 2019-11-03 MED ORDER — PERFLUTREN LIPID MICROSPHERE
1.0000 mL | INTRAVENOUS | Status: AC | PRN
  Administered 2019-11-03: 2 mL via INTRAVENOUS

## 2019-11-03 MED ORDER — REGADENOSON 0.4 MG/5ML IV SOLN
0.4000 mg | Freq: Once | INTRAVENOUS | Status: AC
  Administered 2019-11-03: 0.4 mg via INTRAVENOUS

## 2019-11-05 ENCOUNTER — Encounter: Payer: Self-pay | Admitting: Interventional Cardiology

## 2019-11-05 ENCOUNTER — Other Ambulatory Visit: Payer: Self-pay | Admitting: *Deleted

## 2019-11-05 DIAGNOSIS — I5022 Chronic systolic (congestive) heart failure: Secondary | ICD-10-CM

## 2019-11-05 NOTE — Telephone Encounter (Signed)
error 

## 2019-12-23 ENCOUNTER — Ambulatory Visit (INDEPENDENT_AMBULATORY_CARE_PROVIDER_SITE_OTHER): Payer: 59 | Admitting: *Deleted

## 2019-12-23 DIAGNOSIS — I480 Paroxysmal atrial fibrillation: Secondary | ICD-10-CM | POA: Diagnosis not present

## 2019-12-23 LAB — CUP PACEART REMOTE DEVICE CHECK
Battery Remaining Longevity: 97 mo
Battery Voltage: 3.01 V
Brady Statistic RV Percent Paced: 0.18 %
Date Time Interrogation Session: 20210429001704
HighPow Impedance: 42 Ohm
HighPow Impedance: 53 Ohm
Implantable Lead Implant Date: 20070921
Implantable Lead Location: 753860
Implantable Lead Model: 6947
Implantable Pulse Generator Implant Date: 20170111
Lead Channel Impedance Value: 418 Ohm
Lead Channel Impedance Value: 513 Ohm
Lead Channel Pacing Threshold Amplitude: 0.375 V
Lead Channel Pacing Threshold Pulse Width: 0.4 ms
Lead Channel Sensing Intrinsic Amplitude: 22 mV
Lead Channel Sensing Intrinsic Amplitude: 22 mV
Lead Channel Setting Pacing Amplitude: 2.5 V
Lead Channel Setting Pacing Pulse Width: 0.4 ms
Lead Channel Setting Sensing Sensitivity: 0.3 mV

## 2019-12-24 NOTE — Progress Notes (Signed)
ICD Remote  

## 2020-01-18 ENCOUNTER — Other Ambulatory Visit: Payer: Self-pay

## 2020-01-18 ENCOUNTER — Ambulatory Visit (HOSPITAL_COMMUNITY)
Admission: RE | Admit: 2020-01-18 | Discharge: 2020-01-18 | Disposition: A | Discharge status: Home / Self Care | Payer: 59 | Source: Ambulatory Visit | Attending: Internal Medicine | Admitting: Internal Medicine

## 2020-01-18 ENCOUNTER — Encounter (HOSPITAL_COMMUNITY): Payer: Self-pay | Admitting: Internal Medicine

## 2020-01-18 VITALS — BP 116/74 | HR 59 | Wt 219.0 lb

## 2020-01-18 DIAGNOSIS — Z880 Allergy status to penicillin: Secondary | ICD-10-CM | POA: Diagnosis not present

## 2020-01-18 DIAGNOSIS — Z7982 Long term (current) use of aspirin: Secondary | ICD-10-CM | POA: Diagnosis not present

## 2020-01-18 DIAGNOSIS — I5022 Chronic systolic (congestive) heart failure: Secondary | ICD-10-CM | POA: Diagnosis present

## 2020-01-18 DIAGNOSIS — Z79899 Other long term (current) drug therapy: Secondary | ICD-10-CM | POA: Diagnosis not present

## 2020-01-18 DIAGNOSIS — Z955 Presence of coronary angioplasty implant and graft: Secondary | ICD-10-CM | POA: Diagnosis not present

## 2020-01-18 DIAGNOSIS — Z95810 Presence of automatic (implantable) cardiac defibrillator: Secondary | ICD-10-CM | POA: Insufficient documentation

## 2020-01-18 DIAGNOSIS — Z91048 Other nonmedicinal substance allergy status: Secondary | ICD-10-CM | POA: Insufficient documentation

## 2020-01-18 DIAGNOSIS — I2510 Atherosclerotic heart disease of native coronary artery without angina pectoris: Secondary | ICD-10-CM | POA: Diagnosis not present

## 2020-01-18 DIAGNOSIS — Z8249 Family history of ischemic heart disease and other diseases of the circulatory system: Secondary | ICD-10-CM | POA: Diagnosis not present

## 2020-01-18 DIAGNOSIS — Z86718 Personal history of other venous thrombosis and embolism: Secondary | ICD-10-CM | POA: Insufficient documentation

## 2020-01-18 DIAGNOSIS — I252 Old myocardial infarction: Secondary | ICD-10-CM | POA: Diagnosis not present

## 2020-01-18 DIAGNOSIS — Z951 Presence of aortocoronary bypass graft: Secondary | ICD-10-CM | POA: Diagnosis not present

## 2020-01-18 DIAGNOSIS — Z7901 Long term (current) use of anticoagulants: Secondary | ICD-10-CM | POA: Insufficient documentation

## 2020-01-18 DIAGNOSIS — E785 Hyperlipidemia, unspecified: Secondary | ICD-10-CM | POA: Insufficient documentation

## 2020-01-18 MED ORDER — SPIRONOLACTONE 25 MG PO TABS: 12.5000 mg | ORAL_TABLET | Freq: Every day | ORAL | 6 refills | Status: DC

## 2020-01-18 NOTE — Progress Notes (Signed)
ADVANCED HF CLINIC CONSULT NOTE  Referring Physician: Dr. Tamala Julian Primary Care: Shirline Frees, MD Primary Cardiologist: Dr. Pernell Dupre  HPI:  [name] [name] is a 59 y.o. male with a CAD s/p ostial LAD Taxus DES stent 2005, subsequent acute stent thrombosis 2007, emergency coronary bypass grafting with saphenous vein grafts 3710, chronic systolic heart failure with EF 25-30% January 2016, hyperlipidemia referred by Dr. Tamala Julian for further evaluation of his HF.   Echo 1/20 EF 20-25% RV normal . Echo 3/21: EF 15% RV severe HK (I reviewed and feel EF 20-25% and RV normal))   Says he feels great. Does whatever he wants. Very active. Gets winded if he goes up 2 flights of steps but otherwise fine. No change in this. No CP, edema, orthopnea or PND. No ICD firings. No problems with meds. Tries to do elliptical 3x/week and TM 2x for 30 minutes.   Review of Systems: [y] = yes, [ ]  = no   General: Weight gain [ ] ; Weight loss [ ] ; Anorexia [ ] ; Fatigue [ ] ; Fever [ ] ; Chills [ ] ; Weakness [ ]   Cardiac: Chest pain/pressure [ ] ; Resting SOB [ ] ; Exertional SOB Blue.Reese ]; Orthopnea [ ] ; Pedal Edema [ ] ; Palpitations [ ] ; Syncope [ ] ; Presyncope [ ] ; Paroxysmal nocturnal dyspnea[ ]   Pulmonary: Cough [ ] ; Wheezing[ ] ; Hemoptysis[ ] ; Sputum [ ] ; Snoring [ ]   GI: Vomiting[ ] ; Dysphagia[ ] ; Melena[ ] ; Hematochezia [ ] ; Heartburn[ ] ; Abdominal pain [ ] ; Constipation [ ] ; Diarrhea [ ] ; BRBPR [ ]   GU: Hematuria[ ] ; Dysuria [ ] ; Nocturia[ ]   Vascular: Pain in legs with walking [ ] ; Pain in feet with lying flat [ ] ; Non-healing sores [ ] ; Stroke [ ] ; TIA [ ] ; Slurred speech [ ] ;  Neuro: Headaches[ ] ; Vertigo[ ] ; Seizures[ ] ; Paresthesias[ ] ;Blurred vision [ ] ; Diplopia [ ] ; Vision changes [ ]   Ortho/Skin: Arthritis [y]; Joint pain Blue.Reese ]; Muscle pain [ ] ; Joint swelling [ ] ; Back Pain [ ] ; Rash [ ]   Psych: Depression[ ] ; Anxiety[ ]   Heme: Bleeding problems [ ] ; Clotting disorders [ ] ; Anemia [ ]   Endocrine:  Diabetes [ ] ; Thyroid dysfunction[ ]    Past Medical History:  Diagnosis Date  . CAD (coronary artery disease)    Anterior MI with emergent PCI followed by emergent CABG x 3 in April of 2007 per Dr. Roxan Hockey with SVG to LAD and SVG to ramus & OM1  . Chronic systolic heart failure ([facility])   . Hyperlipidemia   . ICD (implantable cardioverter-defibrillator) in place   . MI (myocardial infarction) ([place]) 11/2005    Current Outpatient Medications  Medication Sig Dispense Refill  . aspirin 81 MG EC tablet Take 1 tablet (81 mg total) by mouth daily.    . carvedilol (COREG) 12.5 MG tablet TAKE 1 TABLET (12.5 MG TOTAL) BY MOUTH 2 (TWO) TIMES DAILY WITH A MEAL. 180 tablet 3  . CRESTOR 40 MG tablet Take 20 mg by mouth daily.  5  . dabigatran (PRADAXA) 150 MG CAPS capsule Take 1 capsule (150 mg total) by mouth 2 (two) times daily. 180 capsule 3  . diphenoxylate-atropine (LOMOTIL) 2.5-0.025 MG per tablet Take 1 tablet by mouth 4 (four) times daily as needed for diarrhea or loose stools.     Marland Kitchen ENTRESTO 49-51 MG TAKE 1 TABLET BY MOUTH TWICE A DAY 60 tablet 5  . fish oil-omega-3 fatty acids 1000 MG capsule Take 1 g by mouth daily.     Marland Kitchen  levothyroxine (SYNTHROID, LEVOTHROID) 25 MCG tablet Take 25 mcg by mouth daily.    . Melatonin 5 MG TABS Take 5 mg by mouth as needed.     . mirtazapine (REMERON) 30 MG tablet Take 30 mg by mouth at bedtime.    . nitroGLYCERIN (NITROSTAT) 0.4 MG SL tablet Place 1 tablet (0.4 mg total) under the tongue every 5 (five) minutes as needed for chest pain. Please make appt with Dr. Katrinka Blazing. 25 tablet 2  . promethazine (PHENERGAN) 25 MG tablet Take 25 mg by mouth every 6 (six) hours as needed for nausea or vomiting.     No current facility-administered medications for this encounter.    Allergies  Allergen Reactions  . Adhesive [Tape] Other (See Comments)    REACTION: "IT BURNS ME"  . Bee Venom Hives and Swelling  . Eggs Or Egg-Derived Products Nausea And Vomiting  .  Penicillins Rash    Has patient had a PCN reaction causing immediate rash, facial/tongue/throat swelling, SOB or lightheadedness with hypotension: Yes Has patient had a PCN reaction causing severe rash involving mucus membranes or skin necrosis: No Has patient had a PCN reaction that required hospitalization No Has patient had a PCN reaction occurring within the last 10 years: No If all of the above answers are "NO", then may proceed with Cephalosporin use.       Social History   Socioeconomic History  . Marital status: Married    Spouse name: Not on file  . Number of children: Not on file  . Years of education: Not on file  . Highest education level: Not on file  Occupational History  . Not on file  Tobacco Use  . Smoking status: Never Smoker  . Smokeless tobacco: Never Used  Substance and Sexual Activity  . Alcohol use: No    Alcohol/week: 0.0 standard drinks  . Drug use: No  . Sexual activity: Not on file  Other Topics Concern  . Not on file  Social History Narrative  . Not on file   Social Determinants of Health   Financial Resource Strain:   . Difficulty of Paying Living Expenses:   Food Insecurity:   . Worried About Programme researcher, broadcasting/film/video in the Last Year:   . Barista in the Last Year:   Transportation Needs:   . Freight forwarder (Medical):   Marland Kitchen Lack of Transportation (Non-Medical):   Physical Activity:   . Days of Exercise per Week:   . Minutes of Exercise per Session:   Stress:   . Feeling of Stress :   Social Connections:   . Frequency of Communication with Friends and Family:   . Frequency of Social Gatherings with Friends and Family:   . Attends Religious Services:   . Active Member of Clubs or Organizations:   . Attends Banker Meetings:   Marland Kitchen Marital Status:   Intimate Partner Violence:   . Fear of Current or Ex-Partner:   . Emotionally Abused:   Marland Kitchen Physically Abused:   . Sexually Abused:       Family History  Problem  Relation Age of Onset  . Heart attack Father     Vitals:   01/18/20 1153  BP: 116/74  Pulse: (!) 59  SpO2: 95%  Weight: 99.3 kg (219 lb)    PHYSICAL EXAM: General:  Well appearing. No respiratory difficulty HEENT: normal Neck: supple. no JVD. Carotids 2+ bilat; no bruits. No lymphadenopathy or thryomegaly appreciated. Cor: PMI nondisplaced.  Regular rate & rhythm. No rubs, gallops or murmurs. Lungs: clear Abdomen: soft, nontender, nondistended. No hepatosplenomegaly. No bruits or masses. Good bowel sounds. Extremities: no cyanosis, clubbing, rash, edema Neuro: alert & oriented x 3, cranial nerves grossly intact. moves all 4 extremities w/o difficulty. Affect pleasant.  ECG: NSR 63 RBBB LAFB. LVH with repol Personally reviewed  ICD: Interrogated personally. Fluid ok. Activity 3 hours/day. No VT/AF.   ASSESSMENT & PLAN:  1. Chronic systolic HF - due to iCM. MDT ICD in place.  - echo 3/21 reviewed personally and compared to to 9/17. I think EF is essentially unchanged NYHA 25% and normal RV - NYHA II - Volume status looks great - Continue carvedilol 12.5 bid HR too low to titrate - Continue Entresto 49/51 bid - Add spiro 12.5.  - Consider Farxiga soon - Echo unchanged from previous. We discussed timing of advanced therapies at length and I think he is too early at this point but will get CPX test to objectively quantify and provide risk stratification given severe LV dysfunction. He has had exposure to VADs through his good friend Sedonia Small - one of our VAD patients.  2. CAD - s/p previous anterior MI - s/p emergent CABG 2007 - no s/s angina - management per Dr. Merceda Elks, MD  2:52 PM

## 2020-01-18 NOTE — Patient Instructions (Signed)
Start Spironolactone 12.5 mg (1/2 tab) daily  Labs needed in 1 week and in 1 month  Your physician has recommended that you have a cardiopulmonary stress test (CPX). CPX testing is a non-invasive measurement of heart and lung function. It replaces a traditional treadmill stress test. This type of test provides a tremendous amount of information that relates not only to your present condition but also for future outcomes. This test combines measurements of you ventilation, respiratory gas exchange in the lungs, electrocardiogram (EKG), blood pressure and physical response before, during, and following an exercise protocol.  Your physician recommends that you schedule a follow-up appointment in: 1 month--Virtual Visit  If you have any questions or concerns before your next appointment please send [place] a message through [place] or call our office at [phone].  At the [facility], you and your health needs are our priority. As part of our continuing mission to provide you with exceptional heart care, we have created designated Provider Care Teams. These Care Teams include your primary Cardiologist (physician) and Advanced Practice Providers (APPs- Physician Assistants and Nurse Practitioners) who all work together to provide you with the care you need, when you need it.   You may see any of the following providers on your designated Care Team at your next follow up: Marland Kitchen Dr Arvilla Meres . Dr Marca Ancona . Tonye Becket, NP . Robbie Lis, PA . Karle Plumber, PharmD   Please be sure to bring in all your medications bottles to every appointment.

## 2020-01-26 ENCOUNTER — Other Ambulatory Visit: Payer: Self-pay | Admitting: Internal Medicine

## 2020-01-26 NOTE — Telephone Encounter (Signed)
Pradaxa 150mg  refill request received. Pt is 59 years old, weight-99.3kg, Crea-1.28 on 06/09/2019 via [facility] from [facility]CP, last seen by Dr. Natrona heights on 01/18/2020 and Dr. 01/20/2020 on 10/15/2019, Diagnosis-Afib, CrCl-86.71ml/min; Dose is appropriate based on dosing criteria. Will send in refill to requested pharmacy.

## 2020-02-01 ENCOUNTER — Encounter (HOSPITAL_COMMUNITY): Payer: Self-pay

## 2020-02-04 ENCOUNTER — Other Ambulatory Visit: Payer: Self-pay | Admitting: Interventional Cardiology

## 2020-02-04 ENCOUNTER — Telehealth (HOSPITAL_COMMUNITY): Payer: Self-pay

## 2020-02-04 NOTE — Telephone Encounter (Signed)
Patient called inquiring about when he was suppose to have blood work done and to see if someone reviewed his echo. I scheduled patient for a lab appointment for next Tuesday(02/08/20). I will route this message to Dr. Gala Romney and his nurse Herbert Seta to follow up on 2nd opinion on echo

## 2020-02-08 ENCOUNTER — Ambulatory Visit (INDEPENDENT_AMBULATORY_CARE_PROVIDER_SITE_OTHER): Payer: 59 | Admitting: Ophthalmology

## 2020-02-08 ENCOUNTER — Encounter (INDEPENDENT_AMBULATORY_CARE_PROVIDER_SITE_OTHER): Payer: Self-pay | Admitting: Ophthalmology

## 2020-02-08 ENCOUNTER — Ambulatory Visit (HOSPITAL_COMMUNITY)
Admission: RE | Admit: 2020-02-08 | Discharge: 2020-02-08 | Disposition: A | Discharge status: Home / Self Care | Payer: 59 | Source: Ambulatory Visit | Attending: Internal Medicine | Admitting: Internal Medicine

## 2020-02-08 ENCOUNTER — Other Ambulatory Visit: Payer: Self-pay

## 2020-02-08 DIAGNOSIS — H43812 Vitreous degeneration, left eye: Secondary | ICD-10-CM | POA: Diagnosis not present

## 2020-02-08 DIAGNOSIS — H43811 Vitreous degeneration, right eye: Secondary | ICD-10-CM

## 2020-02-08 DIAGNOSIS — I5022 Chronic systolic (congestive) heart failure: Secondary | ICD-10-CM | POA: Insufficient documentation

## 2020-02-08 DIAGNOSIS — H4311 Vitreous hemorrhage, right eye: Secondary | ICD-10-CM | POA: Diagnosis not present

## 2020-02-08 LAB — BASIC METABOLIC PANEL
Anion gap: 7 (ref 5–15)
BUN: 13 mg/dL (ref 6–20)
CO2: 25 mmol/L (ref 22–32)
Calcium: 9.2 mg/dL (ref 8.9–10.3)
Chloride: 107 mmol/L (ref 98–111)
Creatinine, Ser: 1.22 mg/dL (ref 0.61–1.24)
GFR calc Af Amer: >60 mL/min (ref >60)
GFR calc non Af Amer: >60 mL/min (ref >60)
Glucose, Bld: 109 mg/dL — ABNORMAL HIGH (ref 70–99)
Potassium: 4.2 mmol/L (ref 3.5–5.1)
Sodium: 139 mmol/L (ref 135–145)

## 2020-02-08 NOTE — Progress Notes (Signed)
02/08/2020     CHIEF COMPLAINT Patient presents for Flashes/floaters   HISTORY OF PRESENT ILLNESS: [name] [name] is a 59 y.o. male who presents to the clinic today for:   HPI    Flashes/floaters    In right eye.  This started 2 days ago.  Duration of 2 days.  Duration Constant.  Characterized as spots.  Since onset it is stable.  Associated Symptoms Floaters and Flashes.          Comments    Flashes/floaters OD - Self-referred  Pt c/o floaters and flashes of light OD x 2 days. Pt sts floaters are constant and flashes come and go OD. Pt denies symptoms OS. Pt sts floaters appear as spots and cobwebs. Pt sts VA OD is "greyed out" and smoky. Pt denies veil or curtain.       Last edited by Rockie Neighbours, [place] on 02/08/2020  3:24 PM. (History)      Referring physician: Shirline Frees, MD [address] [place],  [place] [id number]  HISTORICAL INFORMATION:   Selected notes from the [facility]: No current outpatient medications on file. (Ophthalmic Drugs)   No current facility-administered medications for this visit. (Ophthalmic Drugs)   Current Outpatient Medications (Other)  Medication Sig  . aspirin 81 MG EC tablet Take 1 tablet (81 mg total) by mouth daily.  . carvedilol (COREG) 12.5 MG tablet TAKE 1 TABLET (12.5 MG TOTAL) BY MOUTH 2 (TWO) TIMES DAILY WITH A MEAL.  Marland Kitchen CRESTOR 40 MG tablet Take 20 mg by mouth daily.  . diphenoxylate-atropine (LOMOTIL) 2.5-0.025 MG per tablet Take 1 tablet by mouth 4 (four) times daily as needed for diarrhea or loose stools.   Marland Kitchen ENTRESTO 49-51 MG TAKE 1 TABLET BY MOUTH TWICE A DAY  . fish oil-omega-3 fatty acids 1000 MG capsule Take 1 g by mouth daily.   Marland Kitchen levothyroxine (SYNTHROID, LEVOTHROID) 25 MCG tablet Take 25 mcg by mouth daily.  . Melatonin 5 MG TABS Take 5 mg by mouth as needed.   . mirtazapine (REMERON) 30 MG tablet Take 30 mg by mouth at bedtime.  . nitroGLYCERIN (NITROSTAT)  0.4 MG SL tablet Place 1 tablet (0.4 mg total) under the tongue every 5 (five) minutes as needed for chest pain. Please make appt with Dr. Tamala Julian.  Marland Kitchen PRADAXA 150 MG CAPS capsule TAKE 1 CAPSULE BY MOUTH TWICE A DAY  . promethazine (PHENERGAN) 25 MG tablet Take 25 mg by mouth every 6 (six) hours as needed for nausea or vomiting.  Marland Kitchen spironolactone (ALDACTONE) 25 MG tablet Take 0.5 tablets (12.5 mg total) by mouth daily.   No current facility-administered medications for this visit. (Other)      REVIEW OF SYSTEMS:    ALLERGIES Allergies  Allergen Reactions  . Adhesive [Tape] Other (See Comments)    REACTION: "IT BURNS ME"  . Bee Venom Hives and Swelling  . Eggs Or Egg-Derived Products Nausea And Vomiting  . Penicillins Rash    Has patient had a PCN reaction causing immediate rash, facial/tongue/throat swelling, SOB or lightheadedness with hypotension: Yes Has patient had a PCN reaction causing severe rash involving mucus membranes or skin necrosis: No Has patient had a PCN reaction that required hospitalization No Has patient had a PCN reaction occurring within the last 10 years: No If all of the above answers are "NO", then may proceed with Cephalosporin use.     PAST MEDICAL  HISTORY Past Medical History:  Diagnosis Date  . CAD (coronary artery disease)    Anterior MI with emergent PCI followed by emergent CABG x 3 in April of 2007 per Dr. Dorris Fetch with SVG to LAD and SVG to ramus & OM1  . Chronic systolic heart failure ([facility])   . Hyperlipidemia   . ICD (implantable cardioverter-defibrillator) in place   . MI (myocardial infarction) ([facility]) 11/2005   Past Surgical History:  Procedure Laterality Date  . CARDIAC CATHETERIZATION  2004, 2007  . CARDIAC DEFIBRILLATOR PLACEMENT  04/2006   [employer]-Dr. Amil Amen  . CAROTID STENT INSERTION    . CHOLECYSTECTOMY    . CORONARY ARTERY BYPASS GRAFT  11/2005  . EP IMPLANTABLE DEVICE N/A 09/06/2015   Procedure: ICD Generator  Changeout;  Surgeon: Marinus Maw, MD;  Location: [facility] INVASIVE CV LAB;  Service: Cardiovascular;  Laterality: N/A;  . VASECTOMY      FAMILY HISTORY Family History  Problem Relation Age of Onset  . Heart attack Father     SOCIAL HISTORY Social History   Tobacco Use  . Smoking status: Never Smoker  . Smokeless tobacco: Never Used  Vaping Use  . Vaping Use: Never used  Substance Use Topics  . Alcohol use: No    Alcohol/week: 0.0 standard drinks  . Drug use: No         OPHTHALMIC EXAM: Base Eye Exam    Visual Acuity (ETDRS)      Right Left   Dist cc 20/20 -1 20/25 +2   Correction: Glasses       Tonometry (Tonopen, 3:26 PM)      Right Left   Pressure 09 11       Pupils      Pupils Dark Light Shape React APD   Right PERRL 5 4 Round Brisk None   Left PERRL 5 4 Round Brisk None       Visual Fields (Counting fingers)      Left Right    Full Full       Extraocular Movement      Right Left    Full Full       Neuro/Psych    Oriented x3: Yes   Mood/Affect: Normal       Dilation    Both eyes: 1.0% Mydriacyl, 2.5% Phenylephrine @ 3:26 PM          IMAGING AND PROCEDURES  Imaging and Procedures for 02/08/20  Basic metabolic panel     Component Value Flag Ref Range Units Status   Sodium 139      135 - 145 mmol/L Final   Potassium 4.2      3.5 - 5.1 mmol/L Final   Chloride 107      98 - 111 mmol/L Final   CO2 25      22 - 32 mmol/L Final   Glucose, Bld 109      70 - 99 mg/dL Final   Comment:   Glucose reference range applies only to samples taken after fasting for at least 8 hours.   BUN 13      6 - 20 mg/dL Final   Creatinine, Ser 1.22      0.61 - 1.24 mg/dL Final   Calcium 9.2      8.9 - 10.3 mg/dL Final   GFR calc non Af Amer >60      >60 mL/min Final   GFR calc Af Amer >60      >60 mL/min Final  Anion gap 7      5 - 15  Final   Comment:   Performed at [facility], [address] [place], [place] [address]                   ASSESSMENT/PLAN:  No problem-specific Assessment & Plan notes found for this encounter.    No diagnosis found.  1.  2.  3.  Ophthalmic Meds Ordered this visit:  No orders of the defined types were placed in this encounter.      No follow-ups on file.  There are no Patient Instructions on file for this visit.   Explained the diagnoses, plan, and follow up with the patient and they expressed understanding.  Patient expressed understanding of the importance of proper follow up care.   Alford Highland Jilian West M.D. Diseases & Surgery of the Retina and Vitreous [facility] 02/08/20     Abbreviations: M myopia (nearsighted); A astigmatism; H hyperopia (farsighted); P presbyopia; Mrx spectacle prescription;  CTL contact lenses; OD right eye; OS left eye; OU both eyes  XT exotropia; ET esotropia; PEK punctate epithelial keratitis; PEE punctate epithelial erosions; DES dry eye syndrome; MGD meibomian gland dysfunction; ATs artificial tears; PFAT's preservative free artificial tears; NSC nuclear sclerotic cataract; PSC posterior subcapsular cataract; ERM epi-retinal membrane; PVD posterior vitreous detachment; RD retinal detachment; DM diabetes mellitus; DR diabetic retinopathy; NPDR non-proliferative diabetic retinopathy; PDR proliferative diabetic retinopathy; CSME clinically significant macular edema; DME diabetic macular edema; dbh dot blot hemorrhages; CWS cotton wool spot; POAG primary open angle glaucoma; C/D cup-to-disc ratio; HVF humphrey visual field; GVF goldmann visual field; OCT optical coherence tomography; IOP intraocular pressure; BRVO Branch retinal vein occlusion; CRVO central retinal vein occlusion; CRAO central retinal artery occlusion; BRAO branch retinal artery occlusion; RT retinal tear; SB scleral buckle; PPV pars plana vitrectomy; VH Vitreous hemorrhage; PRP panretinal laser photocoagulation; IVK intravitreal kenalog; VMT vitreomacular traction; MH Macular  hole;  NVD neovascularization of the disc; NVE neovascularization elsewhere; AREDS age related eye disease study; ARMD age related macular degeneration; POAG primary open angle glaucoma; EBMD epithelial/anterior basement membrane dystrophy; ACIOL anterior chamber intraocular lens; IOL intraocular lens; PCIOL posterior chamber intraocular lens; Phaco/IOL phacoemulsification with intraocular lens placement; PRK photorefractive keratectomy; LASIK laser assisted in situ keratomileusis; HTN hypertension; DM diabetes mellitus; COPD chronic obstructive pulmonary disease

## 2020-02-08 NOTE — Assessment & Plan Note (Signed)
The nature of the vitreous hemorrhage was discussed with the patient as well as the common causes.   Patients with diabetic eye disease may develop retinal neovascularization.  Other eye conditions develop retinal  neovascularization secondary to retinal venous occlusions.   Vitreous hemorrhage may result from spontaneous vitreous detachment or retinal breaks.  Blunt trauma is a common cause as wll.  The need for serial evaluation of the fundus until  clear views are obtained was addressed. An occasional need  to monitor the condition by in office  B-scan ultrasonography in the case of dense vitreous hemorrhage was discussed. Head of bed elevated at night.

## 2020-02-09 NOTE — Telephone Encounter (Signed)
Message sent back to pt via mychart

## 2020-02-29 ENCOUNTER — Encounter (INDEPENDENT_AMBULATORY_CARE_PROVIDER_SITE_OTHER): Payer: Self-pay | Admitting: Ophthalmology

## 2020-02-29 ENCOUNTER — Ambulatory Visit (INDEPENDENT_AMBULATORY_CARE_PROVIDER_SITE_OTHER): Payer: 59 | Admitting: Ophthalmology

## 2020-02-29 ENCOUNTER — Encounter (INDEPENDENT_AMBULATORY_CARE_PROVIDER_SITE_OTHER): Payer: 59 | Admitting: Ophthalmology

## 2020-02-29 ENCOUNTER — Other Ambulatory Visit: Payer: Self-pay

## 2020-02-29 DIAGNOSIS — H43811 Vitreous degeneration, right eye: Secondary | ICD-10-CM

## 2020-02-29 DIAGNOSIS — H4311 Vitreous hemorrhage, right eye: Secondary | ICD-10-CM

## 2020-02-29 NOTE — Progress Notes (Signed)
02/29/2020     CHIEF COMPLAINT Patient presents for Retina Follow Up   HISTORY OF PRESENT ILLNESS: [name] [name] is a 59 y.o. male who presents to the clinic today for:   HPI    Retina Follow Up    Patient presents with  Other.  In right eye.  This started 3 weeks ago.  Severity is mild.  Duration of 3 weeks.  Since onset it is stable.          Comments    3 Week [facility] F/U OD  Pt sts [facility] OD seems to be improving. Pt sts "thick heavy stuff" is improved OD. Pt denies ocular pain, flashes of light, or floaters OU.         Last edited by Ileana Roup, COA on 02/29/2020  3:23 PM. (History)      Referring physician: [name], MD [phone] [address],  [place] [address]  HISTORICAL INFORMATION:   Selected notes from the MEDICAL RECORD NUMBER       CURRENT MEDICATIONS: No current outpatient medications on file. (Ophthalmic Drugs)   No current facility-administered medications for this visit. (Ophthalmic Drugs)   Current Outpatient Medications (Other)  Medication Sig  . aspirin 81 MG EC tablet Take 1 tablet (81 mg total) by mouth daily.  . carvedilol (COREG) 12.5 MG tablet TAKE 1 TABLET (12.5 MG TOTAL) BY MOUTH 2 (TWO) TIMES DAILY WITH A MEAL.  Marland Kitchen CRESTOR 40 MG tablet Take 20 mg by mouth daily.  . diphenoxylate-atropine (LOMOTIL) 2.5-0.025 MG per tablet Take 1 tablet by mouth 4 (four) times daily as needed for diarrhea or loose stools.   Marland Kitchen ENTRESTO 49-51 MG TAKE 1 TABLET BY MOUTH TWICE A DAY  . fish oil-omega-3 fatty acids 1000 MG capsule Take 1 g by mouth daily.   Marland Kitchen levothyroxine (SYNTHROID, LEVOTHROID) 25 MCG tablet Take 25 mcg by mouth daily.  . Melatonin 5 MG TABS Take 5 mg by mouth as needed.   . mirtazapine (REMERON) 30 MG tablet Take 30 mg by mouth at bedtime.  . nitroGLYCERIN (NITROSTAT) 0.4 MG SL tablet Place 1 tablet (0.4 mg total) under the tongue every 5 (five) minutes as needed for chest pain. Please make appt with Dr. Katrinka Blazing.  Marland Kitchen PRADAXA  150 MG CAPS capsule TAKE 1 CAPSULE BY MOUTH TWICE A DAY  . promethazine (PHENERGAN) 25 MG tablet Take 25 mg by mouth every 6 (six) hours as needed for nausea or vomiting.  Marland Kitchen spironolactone (ALDACTONE) 25 MG tablet Take 0.5 tablets (12.5 mg total) by mouth daily.   No current facility-administered medications for this visit. (Other)      REVIEW OF SYSTEMS:    ALLERGIES Allergies  Allergen Reactions  . Adhesive [Tape] Other (See Comments)    REACTION: "IT BURNS ME"  . Bee Venom Hives and Swelling  . Eggs Or Egg-Derived Products Nausea And Vomiting  . Penicillins Rash    Has patient had a PCN reaction causing immediate rash, facial/tongue/throat swelling, SOB or lightheadedness with hypotension: Yes Has patient had a PCN reaction causing severe rash involving mucus membranes or skin necrosis: No Has patient had a PCN reaction that required hospitalization No Has patient had a PCN reaction occurring within the last 10 years: No If all of the above answers are "NO", then may proceed with Cephalosporin use.     PAST MEDICAL HISTORY Past Medical History:  Diagnosis Date  . CAD (coronary artery disease)    Anterior MI  with emergent PCI followed by emergent CABG x 3 in April of 2007 per Dr. Dorris Fetch with SVG to LAD and SVG to ramus & OM1  . Chronic systolic heart failure (HCC)   . Hyperlipidemia   . ICD (implantable cardioverter-defibrillator) in place   . MI (myocardial infarction) (HCC) 11/2005   Past Surgical History:  Procedure Laterality Date  . CARDIAC CATHETERIZATION  2004, 2007  . CARDIAC DEFIBRILLATOR PLACEMENT  04/2006   [employer]-Dr. Amil Amen  . CAROTID STENT INSERTION    . CHOLECYSTECTOMY    . CORONARY ARTERY BYPASS GRAFT  11/2005  . EP IMPLANTABLE DEVICE N/A 09/06/2015   Procedure: ICD Generator Changeout;  Surgeon: Marinus Maw, MD;  Location: [facility] INVASIVE CV LAB;  Service: Cardiovascular;  Laterality: N/A;  . VASECTOMY      FAMILY HISTORY Family  History  Problem Relation Age of Onset  . Heart attack Father     SOCIAL HISTORY Social History   Tobacco Use  . Smoking status: Never Smoker  . Smokeless tobacco: Never Used  Vaping Use  . Vaping Use: Never used  Substance Use Topics  . Alcohol use: No    Alcohol/week: 0.0 standard drinks  . Drug use: No         OPHTHALMIC EXAM:  Base Eye Exam    Visual Acuity (ETDRS)      Right Left   Dist cc 20/20 -1 20/20 -1   Correction: Glasses       Tonometry (Tonopen, 3:25 PM)      Right Left   Pressure 16 15       Pupils      Pupils Dark Light Shape React APD   Right PERRL 5 4 Round Brisk None   Left PERRL 5 4 Round Brisk None       Visual Fields (Counting fingers)      Left Right    Full Full       Extraocular Movement      Right Left    Full Full       Neuro/Psych    Oriented x3: Yes   Mood/Affect: Normal       Dilation    Right eye: 1.0% Mydriacyl, 2.5% Phenylephrine @ 3:25 PM        Slit Lamp and Fundus Exam    External Exam      Right Left   External Normal Normal       Slit Lamp Exam      Right Left   Lids/Lashes Normal Normal   Conjunctiva/Sclera White and quiet White and quiet   Cornea Clear Clear   Anterior Chamber Deep and quiet Deep and quiet   Iris Round and reactive Round and reactive   Lens Nuclear sclerosis Nuclear sclerosis   Anterior Vitreous Normal Normal       Fundus Exam      Right Left   Posterior Vitreous Posterior vitreous detachment, Pre-retinal hemorrhage, and veils of vitreous hemorrhage centrally over the macula.No peripheral dot hemorrhages are noted on scleral depression    Disc Normal    C/D Ratio 0.1    Macula Normal    Vessels Normal    Periphery scleral depressed exam, 25D, no holes or tears, retina attached           IMAGING AND PROCEDURES  Imaging and Procedures for 03/06/20  Color Fundus Photography Optos - OU - Both Eyes       Right Eye Progression has improved. Disc findings include  normal observations. Macula : normal observations. Vessels : normal observations. Periphery : normal observations.   Left Eye Progression has been stable. Disc findings include normal observations. Macula : normal observations. Vessels : normal observations. Periphery : normal observations.   Notes Posterior vitreous detachment, PVD overlying the nerve.  Mild preretinal vitreous dispersed hemorrhage has cleared.  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  OS, with a similar macular perfused off the optic nerve although apparently from the superior branch retinal artery.                ASSESSMENT/PLAN:  Posterior vitreous detachment of right eye   The nature of posterior vitreous detachment was discussed with the patient as well as its physiology, its age prevalence, and its possible implication regarding retinal breaks and detachment.  An informational brochure was given to the patient.  All the patient's questions were answered.  The patient was asked to return if new or different flashes or floaters develops.   Patient was instructed to contact office immediately if any changes were noticed. I explained to the patient that vitreous inside the eye is similar to jello inside a bowl. As the jello melts it can start to pull away from the bowl, similarly the vitreous throughout our lives can begin to pull away from the retina. That process is called a posterior vitreous detachment. In some cases, the vitreous can tug hard enough on the retina to form a retinal tear. I discussed with the patient the signs and symptoms of a retinal detachment.  Do not rub the eye.  I explained to the patient that these floaters while he will improve that they will never completely clear.  They tend to be visible on certain lighting situations or when "daydreaming".  Patient said today, " I guess I have to learn to make friends with them"      ICD-10-CM   1. Vitreous hemorrhage of  right eye (HCC)  H43.11 Color Fundus Photography Optos - OU - Both Eyes  2. Posterior vitreous detachment of right eye  H43.811     1.  Significant clearance of vitreous hemorrhage and vitreous debris OD, posterior vitreous detachment now visualized On the color fundus photography.  2.  No retinal holes or tears peripherally.  3.  Ophthalmic Meds Ordered this visit:  No orders of the defined types were placed in this encounter.      Return in about 3 months (around 05/31/2020) for DILATE OU, COLOR FP.  There are no Patient Instructions on file for this visit.   Explained the diagnoses, plan, and follow up with the patient and they expressed understanding.  Patient expressed understanding of the importance of proper follow up care.   Alford Highland Kehinde Totzke M.D. Diseases & Surgery of the Retina and Vitreous [facility] 03/06/20     Abbreviations: M myopia (nearsighted); A astigmatism; H hyperopia (farsighted); P presbyopia; Mrx spectacle prescription;  CTL contact lenses; OD right eye; OS left eye; OU both eyes  XT exotropia; ET esotropia; PEK punctate epithelial keratitis; PEE punctate epithelial erosions; DES dry eye syndrome; MGD meibomian gland dysfunction; ATs artificial tears; PFAT's preservative free artificial tears; NSC nuclear sclerotic cataract; PSC posterior subcapsular cataract; ERM epi-retinal membrane; PVD posterior vitreous detachment; RD retinal detachment; DM diabetes mellitus; DR diabetic retinopathy; NPDR non-proliferative diabetic retinopathy; PDR proliferative diabetic retinopathy; CSME clinically significant macular edema; DME diabetic macular edema; dbh dot blot hemorrhages; CWS cotton wool spot; POAG  primary open angle glaucoma; C/D cup-to-disc ratio; HVF humphrey visual field; GVF goldmann visual field; OCT optical coherence tomography; IOP intraocular pressure; BRVO Branch retinal vein occlusion; CRVO central retinal vein occlusion; CRAO central  retinal artery occlusion; BRAO branch retinal artery occlusion; RT retinal tear; SB scleral buckle; PPV pars plana vitrectomy; VH Vitreous hemorrhage; PRP panretinal laser photocoagulation; IVK intravitreal kenalog; VMT vitreomacular traction; MH Macular hole;  NVD neovascularization of the disc; NVE neovascularization elsewhere; AREDS age related eye disease study; ARMD age related macular degeneration; POAG primary open angle glaucoma; EBMD epithelial/anterior basement membrane dystrophy; ACIOL anterior chamber intraocular lens; IOL intraocular lens; PCIOL posterior chamber intraocular lens; Phaco/IOL phacoemulsification with intraocular lens placement; [place] photorefractive keratectomy; LASIK laser assisted in situ keratomileusis; HTN hypertension; DM diabetes mellitus; COPD chronic obstructive pulmonary disease

## 2020-02-29 NOTE — Assessment & Plan Note (Signed)
The nature of posterior vitreous detachment was discussed with the patient as well as its physiology, its age prevalence, and its possible implication regarding retinal breaks and detachment.  An informational brochure was given to the patient.  All the patient's questions were answered.  The patient was asked to return if new or different flashes or floaters develops.   Patient was instructed to contact office immediately if any changes were noticed. I explained to the patient that vitreous inside the eye is similar to jello inside a bowl. As the jello melts it can start to pull away from the bowl, similarly the vitreous throughout our lives can begin to pull away from the retina. That process is called a posterior vitreous detachment. In some cases, the vitreous can tug hard enough on the retina to form a retinal tear. I discussed with the patient the signs and symptoms of a retinal detachment.  Do not rub the eye.  I explained to the patient that these floaters while he will improve that they will never completely clear.  They tend to be visible on certain lighting situations or when "daydreaming".  Patient said today, " I guess I have to learn to make friends with them"

## 2020-03-02 ENCOUNTER — Other Ambulatory Visit (HOSPITAL_COMMUNITY): Payer: Self-pay | Admitting: *Deleted

## 2020-03-02 ENCOUNTER — Ambulatory Visit (HOSPITAL_COMMUNITY): Payer: 59 | Attending: Cardiology

## 2020-03-02 ENCOUNTER — Other Ambulatory Visit: Payer: Self-pay

## 2020-03-02 DIAGNOSIS — I5022 Chronic systolic (congestive) heart failure: Secondary | ICD-10-CM

## 2020-03-21 ENCOUNTER — Encounter (HOSPITAL_COMMUNITY): Payer: Self-pay

## 2020-03-23 ENCOUNTER — Ambulatory Visit (INDEPENDENT_AMBULATORY_CARE_PROVIDER_SITE_OTHER): Payer: 59 | Admitting: *Deleted

## 2020-03-23 DIAGNOSIS — I255 Ischemic cardiomyopathy: Secondary | ICD-10-CM

## 2020-03-23 DIAGNOSIS — I5022 Chronic systolic (congestive) heart failure: Secondary | ICD-10-CM

## 2020-03-23 LAB — CUP PACEART REMOTE DEVICE CHECK
Battery Remaining Longevity: 93 mo
Battery Voltage: 3.01 V
Brady Statistic RV Percent Paced: 0.3 %
Date Time Interrogation Session: 20210729022605
HighPow Impedance: 45 Ohm
HighPow Impedance: 56 Ohm
Implantable Lead Implant Date: 20070921
Implantable Lead Location: 753860
Implantable Lead Model: 6947
Implantable Pulse Generator Implant Date: 20170111
Lead Channel Impedance Value: 456 Ohm
Lead Channel Impedance Value: 532 Ohm
Lead Channel Pacing Threshold Amplitude: 0.375 V
Lead Channel Pacing Threshold Pulse Width: 0.4 ms
Lead Channel Sensing Intrinsic Amplitude: 21 mV
Lead Channel Sensing Intrinsic Amplitude: 21 mV
Lead Channel Setting Pacing Amplitude: 2.5 V
Lead Channel Setting Pacing Pulse Width: 0.4 ms
Lead Channel Setting Sensing Sensitivity: 0.3 mV

## 2020-03-27 NOTE — Progress Notes (Signed)
Remote ICD transmission.   

## 2020-03-28 NOTE — Telephone Encounter (Signed)
He did well. Relatively normal test. Better than I expected.

## 2020-04-16 ENCOUNTER — Other Ambulatory Visit: Payer: Self-pay | Admitting: Internal Medicine

## 2020-06-01 ENCOUNTER — Encounter (INDEPENDENT_AMBULATORY_CARE_PROVIDER_SITE_OTHER): Payer: Self-pay | Admitting: Ophthalmology

## 2020-06-01 ENCOUNTER — Other Ambulatory Visit: Payer: Self-pay

## 2020-06-01 ENCOUNTER — Ambulatory Visit (INDEPENDENT_AMBULATORY_CARE_PROVIDER_SITE_OTHER): Payer: 59 | Admitting: Ophthalmology

## 2020-06-01 ENCOUNTER — Encounter (INDEPENDENT_AMBULATORY_CARE_PROVIDER_SITE_OTHER): Payer: 59 | Admitting: Ophthalmology

## 2020-06-01 DIAGNOSIS — H43811 Vitreous degeneration, right eye: Secondary | ICD-10-CM | POA: Diagnosis not present

## 2020-06-01 DIAGNOSIS — H4311 Vitreous hemorrhage, right eye: Secondary | ICD-10-CM

## 2020-06-01 NOTE — Assessment & Plan Note (Signed)
Clinically cleared as of now, status post central hemorrhage June 2021.  This likely suggest that there was vitreal papillary traction leading to small capillary hemorrhage into the mid vitreous in the visual axis which is now cleared

## 2020-06-01 NOTE — Assessment & Plan Note (Signed)
No holes or tears 

## 2020-06-01 NOTE — Progress Notes (Signed)
06/01/2020     CHIEF COMPLAINT Patient presents for Retina Follow Up   HISTORY OF PRESENT ILLNESS: [name] [name] is a 59 y.o. male who presents to the clinic today for:   HPI    Retina Follow Up    Diagnosis: Vit Hem.  In right eye.  Severity is moderate.  Duration of 3 months.  Since onset it is stable.  I, the attending physician,  performed the HPI with the patient and updated documentation appropriately.          Comments    3 Month Vit Hem f\u. FP  Pt states vision has been stable. Pt is still seeing floaters and the FOL off to the side.       Last edited by Elyse Jarvis on 06/01/2020  3:19 PM. (History)      Referring physician: [name], MD [phone] [address],  [place] [address]  HISTORICAL INFORMATION:   Selected notes from the MEDICAL RECORD NUMBER       CURRENT MEDICATIONS: No current outpatient medications on file. (Ophthalmic Drugs)   No current facility-administered medications for this visit. (Ophthalmic Drugs)   Current Outpatient Medications (Other)  Medication Sig  . aspirin 81 MG EC tablet Take 1 tablet (81 mg total) by mouth daily.  . carvedilol (COREG) 12.5 MG tablet TAKE 1 TABLET (12.5 MG TOTAL) BY MOUTH 2 (TWO) TIMES DAILY WITH A MEAL.  Marland Kitchen CRESTOR 40 MG tablet Take 20 mg by mouth daily.  . diphenoxylate-atropine (LOMOTIL) 2.5-0.025 MG per tablet Take 1 tablet by mouth 4 (four) times daily as needed for diarrhea or loose stools.   Marland Kitchen ENTRESTO 49-51 MG TAKE 1 TABLET BY MOUTH TWICE A DAY  . fish oil-omega-3 fatty acids 1000 MG capsule Take 1 g by mouth daily.   Marland Kitchen levothyroxine (SYNTHROID, LEVOTHROID) 25 MCG tablet Take 25 mcg by mouth daily.  . Melatonin 5 MG TABS Take 5 mg by mouth as needed.   . mirtazapine (REMERON) 30 MG tablet Take 30 mg by mouth at bedtime.  . nitroGLYCERIN (NITROSTAT) 0.4 MG SL tablet Place 1 tablet (0.4 mg total) under the tongue every 5 (five) minutes as needed for chest pain.  Marland Kitchen PRADAXA  150 MG CAPS capsule TAKE 1 CAPSULE BY MOUTH TWICE A DAY  . promethazine (PHENERGAN) 25 MG tablet Take 25 mg by mouth every 6 (six) hours as needed for nausea or vomiting.  Marland Kitchen spironolactone (ALDACTONE) 25 MG tablet Take 0.5 tablets (12.5 mg total) by mouth daily.   No current facility-administered medications for this visit. (Other)      REVIEW OF SYSTEMS:    ALLERGIES Allergies  Allergen Reactions  . Adhesive [Tape] Other (See Comments)    REACTION: "IT BURNS ME"  . Bee Venom Hives and Swelling  . Eggs Or Egg-Derived Products Nausea And Vomiting  . Penicillins Rash    Has patient had a PCN reaction causing immediate rash, facial/tongue/throat swelling, SOB or lightheadedness with hypotension: Yes Has patient had a PCN reaction causing severe rash involving mucus membranes or skin necrosis: No Has patient had a PCN reaction that required hospitalization No Has patient had a PCN reaction occurring within the last 10 years: No If all of the above answers are "NO", then may proceed with Cephalosporin use.     PAST MEDICAL HISTORY Past Medical History:  Diagnosis Date  . CAD (coronary artery disease)    Anterior MI with emergent PCI followed by emergent CABG  x 3 in April of 2007 per Dr. Dorris Fetch with SVG to LAD and SVG to ramus & OM1  . Chronic systolic heart failure (HCC)   . Hyperlipidemia   . ICD (implantable cardioverter-defibrillator) in place   . MI (myocardial infarction) (HCC) 11/2005   Past Surgical History:  Procedure Laterality Date  . CARDIAC CATHETERIZATION  2004, 2007  . CARDIAC DEFIBRILLATOR PLACEMENT  04/2006   [employer]-Dr. Amil Amen  . CAROTID STENT INSERTION    . CHOLECYSTECTOMY    . CORONARY ARTERY BYPASS GRAFT  11/2005  . EP IMPLANTABLE DEVICE N/A 09/06/2015   Procedure: ICD Generator Changeout;  Surgeon: Marinus Maw, MD;  Location: [facility] INVASIVE CV LAB;  Service: Cardiovascular;  Laterality: N/A;  . VASECTOMY      FAMILY HISTORY Family  History  Problem Relation Age of Onset  . Heart attack Father     SOCIAL HISTORY Social History   Tobacco Use  . Smoking status: Never Smoker  . Smokeless tobacco: Never Used  Vaping Use  . Vaping Use: Never used  Substance Use Topics  . Alcohol use: No    Alcohol/week: 0.0 standard drinks  . Drug use: No         OPHTHALMIC EXAM:  Base Eye Exam    Visual Acuity (Snellen - Linear)      Right Left   Dist cc 20/20 20/25   Correction: Glasses       Tonometry (Tonopen, 3:23 PM)      Right Left   Pressure 8 9       Pupils      Pupils Dark Light Shape React APD   Right PERRL 6 4 Round Brisk None   Left PERRL 6 4 Round Brisk None       Visual Fields (Counting fingers)      Left Right    Full        Neuro/Psych    Oriented x3: Yes   Mood/Affect: Normal       Dilation    Both eyes: 1.0% Mydriacyl, 2.5% Phenylephrine @ 3:23 PM        Slit Lamp and Fundus Exam    External Exam      Right Left   External Normal Normal       Slit Lamp Exam      Right Left   Lids/Lashes Normal Normal   Conjunctiva/Sclera White and quiet White and quiet   Cornea Clear Clear   Anterior Chamber Deep and quiet Deep and quiet   Iris Round and reactive Round and reactive   Lens Nuclear sclerosis Nuclear sclerosis   Anterior Vitreous Normal Normal       Fundus Exam      Right Left   Posterior Vitreous Posterior vitreous detachment, no heme Posterior vitreous detachment   Disc Normal Normal   C/D Ratio 0.1 0.1   Macula Normal Normal   Vessels Normal Normal   Periphery scleral depressed exam, 25D, no holes or tears, retina attached Normal, no retinal breaks with scleral depression, 20 5D lens          IMAGING AND PROCEDURES  Imaging and Procedures for 06/01/20  Color Fundus Photography Optos - OU - Both Eyes       Right Eye Progression has improved. Disc findings include normal observations. Macula : normal observations. Vessels : normal observations. Periphery :  normal observations.   Left Eye Progression has been stable. Disc findings include normal observations. Vessels : normal observations. Periphery :  normal observations.   Notes Vitreous hemorrhage centrally OD has abated, residual PVD, periphery, vessels, macula nerves all normal                  ASSESSMENT/PLAN:  Posterior vitreous detachment of right eye No holes or tears  Vitreous hemorrhage of right eye (HCC) Clinically cleared as of now, status post central hemorrhage June 2021.  This likely suggest that there was vitreal papillary traction leading to small capillary hemorrhage into the mid vitreous in the visual axis which is now cleared      ICD-10-CM   1. Vitreous hemorrhage of right eye (HCC)  H43.11 Color Fundus Photography Optos - OU - Both Eyes  2. Posterior vitreous detachment of right eye  H43.811     1.  In the absence of retinal holes or tears, no high risk features remain.  2.  Patient may return here on an as-needed basis or if new visual difficulties arise particularly new onset of flashes floaters in either eye  3.  Ophthalmic Meds Ordered this visit:  No orders of the defined types were placed in this encounter.      Return if symptoms worsen or fail to improve, for Follow-up Dr. Herbert Moors as scheduled or less than 1 year.  There are no Patient Instructions on file for this visit.   Explained the diagnoses, plan, and follow up with the patient and they expressed understanding.  Patient expressed understanding of the importance of proper follow up care.   Alford Highland Eliakim Tendler M.D. Diseases & Surgery of the Retina and Vitreous [facility] 06/01/20     Abbreviations: M myopia (nearsighted); A astigmatism; H hyperopia (farsighted); P presbyopia; Mrx spectacle prescription;  CTL contact lenses; OD right eye; OS left eye; OU both eyes  XT exotropia; ET esotropia; PEK punctate epithelial keratitis; PEE punctate epithelial erosions;  DES dry eye syndrome; MGD meibomian gland dysfunction; ATs artificial tears; PFAT's preservative free artificial tears; NSC nuclear sclerotic cataract; PSC posterior subcapsular cataract; ERM epi-retinal membrane; PVD posterior vitreous detachment; RD retinal detachment; DM diabetes mellitus; DR diabetic retinopathy; NPDR non-proliferative diabetic retinopathy; PDR proliferative diabetic retinopathy; CSME clinically significant macular edema; DME diabetic macular edema; dbh dot blot hemorrhages; CWS cotton wool spot; POAG primary open angle glaucoma; C/D cup-to-disc ratio; HVF humphrey visual field; GVF goldmann visual field; OCT optical coherence tomography; IOP intraocular pressure; BRVO Branch retinal vein occlusion; CRVO central retinal vein occlusion; CRAO central retinal artery occlusion; BRAO branch retinal artery occlusion; RT retinal tear; SB scleral buckle; PPV pars plana vitrectomy; VH Vitreous hemorrhage; PRP panretinal laser photocoagulation; IVK intravitreal kenalog; VMT vitreomacular traction; MH Macular hole;  NVD neovascularization of the disc; NVE neovascularization elsewhere; AREDS age related eye disease study; ARMD age related macular degeneration; POAG primary open angle glaucoma; EBMD epithelial/anterior basement membrane dystrophy; ACIOL anterior chamber intraocular lens; IOL intraocular lens; PCIOL posterior chamber intraocular lens; Phaco/IOL phacoemulsification with intraocular lens placement; PRK photorefractive keratectomy; LASIK laser assisted in situ keratomileusis; HTN hypertension; DM diabetes mellitus; COPD chronic obstructive pulmonary disease

## 2020-06-22 ENCOUNTER — Ambulatory Visit (INDEPENDENT_AMBULATORY_CARE_PROVIDER_SITE_OTHER): Payer: 59

## 2020-06-22 DIAGNOSIS — I255 Ischemic cardiomyopathy: Secondary | ICD-10-CM | POA: Diagnosis not present

## 2020-06-22 LAB — CUP PACEART REMOTE DEVICE CHECK
Battery Remaining Longevity: 88 mo
Battery Voltage: 3 V
Brady Statistic RV Percent Paced: 0.09 %
Date Time Interrogation Session: 20211028042508
HighPow Impedance: 44 Ohm
HighPow Impedance: 54 Ohm
Implantable Lead Implant Date: 20070921
Implantable Lead Location: 753860
Implantable Lead Model: 6947
Implantable Pulse Generator Implant Date: 20170111
Lead Channel Impedance Value: 418 Ohm
Lead Channel Impedance Value: 513 Ohm
Lead Channel Pacing Threshold Amplitude: 0.375 V
Lead Channel Pacing Threshold Pulse Width: 0.4 ms
Lead Channel Sensing Intrinsic Amplitude: 23.5 mV
Lead Channel Sensing Intrinsic Amplitude: 23.5 mV
Lead Channel Setting Pacing Amplitude: 2.5 V
Lead Channel Setting Pacing Pulse Width: 0.4 ms
Lead Channel Setting Sensing Sensitivity: 0.3 mV

## 2020-06-27 NOTE — Progress Notes (Signed)
Remote ICD transmission.   

## 2020-07-03 ENCOUNTER — Other Ambulatory Visit: Payer: Self-pay | Admitting: Interventional Cardiology

## 2020-07-23 ENCOUNTER — Other Ambulatory Visit: Payer: Self-pay | Admitting: Interventional Cardiology

## 2020-07-24 ENCOUNTER — Other Ambulatory Visit (HOSPITAL_COMMUNITY): Payer: Self-pay | Admitting: Internal Medicine

## 2020-07-24 ENCOUNTER — Encounter (HOSPITAL_COMMUNITY): Payer: Self-pay

## 2020-07-24 ENCOUNTER — Other Ambulatory Visit (HOSPITAL_COMMUNITY): Payer: Self-pay | Admitting: *Deleted

## 2020-07-24 MED ORDER — SPIRONOLACTONE 25 MG PO TABS
12.5000 mg | ORAL_TABLET | Freq: Every day | ORAL | 6 refills | Status: DC
Start: 2020-07-24 — End: 2021-03-05

## 2020-07-24 NOTE — Telephone Encounter (Signed)
Prescription refill request received for Pradaxa 150mg  BID.   Indication: afib Scr:1.22, 02/08/2020 LOV: 10/15/2019, [place] Weight: 99.3 kg Age: 59 yo CrCl: 90 ml/min   Prescription refill sent.

## 2020-09-21 ENCOUNTER — Ambulatory Visit (INDEPENDENT_AMBULATORY_CARE_PROVIDER_SITE_OTHER): Payer: 59

## 2020-09-21 DIAGNOSIS — I255 Ischemic cardiomyopathy: Secondary | ICD-10-CM | POA: Diagnosis not present

## 2020-09-21 LAB — CUP PACEART REMOTE DEVICE CHECK
Battery Remaining Longevity: 84 mo
Battery Voltage: 3.01 V
Brady Statistic RV Percent Paced: 0.09 %
Date Time Interrogation Session: 20220127033423
HighPow Impedance: 44 Ohm
HighPow Impedance: 54 Ohm
Implantable Lead Implant Date: 20070921
Implantable Lead Location: 753860
Implantable Lead Model: 6947
Implantable Pulse Generator Implant Date: 20170111
Lead Channel Impedance Value: 418 Ohm
Lead Channel Impedance Value: 513 Ohm
Lead Channel Pacing Threshold Amplitude: 0.375 V
Lead Channel Pacing Threshold Pulse Width: 0.4 ms
Lead Channel Sensing Intrinsic Amplitude: 22.125 mV
Lead Channel Sensing Intrinsic Amplitude: 22.125 mV
Lead Channel Setting Pacing Amplitude: 2.5 V
Lead Channel Setting Pacing Pulse Width: 0.4 ms
Lead Channel Setting Sensing Sensitivity: 0.3 mV

## 2020-09-28 ENCOUNTER — Other Ambulatory Visit: Payer: Self-pay | Admitting: Interventional Cardiology

## 2020-09-30 NOTE — Progress Notes (Signed)
Remote ICD transmission.   

## 2020-10-10 ENCOUNTER — Other Ambulatory Visit: Payer: Self-pay

## 2020-10-10 ENCOUNTER — Encounter (INDEPENDENT_AMBULATORY_CARE_PROVIDER_SITE_OTHER): Payer: Self-pay | Admitting: Ophthalmology

## 2020-10-10 ENCOUNTER — Ambulatory Visit (INDEPENDENT_AMBULATORY_CARE_PROVIDER_SITE_OTHER): Payer: 59 | Admitting: Ophthalmology

## 2020-10-10 DIAGNOSIS — H43811 Vitreous degeneration, right eye: Secondary | ICD-10-CM | POA: Diagnosis not present

## 2020-10-10 DIAGNOSIS — H2511 Age-related nuclear cataract, right eye: Secondary | ICD-10-CM

## 2020-10-10 DIAGNOSIS — H4311 Vitreous hemorrhage, right eye: Secondary | ICD-10-CM

## 2020-10-10 NOTE — Assessment & Plan Note (Signed)
Hemorrhage is cleared, no new findings, no retinal holes or tears, no relation to discomfort from compression of the eye

## 2020-10-10 NOTE — Assessment & Plan Note (Signed)
No holes or tears 

## 2020-10-10 NOTE — Assessment & Plan Note (Signed)
Mild thus far

## 2020-10-10 NOTE — Progress Notes (Signed)
10/10/2020     CHIEF COMPLAINT Patient presents for Eye Pain (Pt c/o OD being sore, onset last Wednesday. Pt states it is sore to the touch. Pt can feel the soreness when not touching OD but it is not as bad. Denies new FOL and floaters. Using artifical tears.)   HISTORY OF PRESENT ILLNESS: [name] [name] is a 60 y.o. male who presents to the clinic today for:   HPI    Eye Pain      In right eye.  Characterized as aching.  Pain was noted as 1/10.  Occurring constantly.  It is worse throughout the day.  Duration of 5 days.  Since onset it is stable.  Treatments tried include artificial tears.  Response to treatment was no improvement.  I, the attending physician,  performed the HPI with the patient and updated documentation appropriately. Additional comments: Pt c/o OD being sore, onset last Wednesday. Pt states it is sore to the touch. Pt can feel the soreness when not touching OD but it is not as bad. Denies new FOL and floaters. Using artifical tears.       Last edited by Elyse Jarvis on 10/10/2020  2:47 PM. (History)      Referring physician: [name], MD [id number] [place],  [place] [address]  HISTORICAL INFORMATION:   Selected notes from the MEDICAL RECORD NUMBER       CURRENT MEDICATIONS: No current outpatient medications on file. (Ophthalmic Drugs)   No current facility-administered medications for this visit. (Ophthalmic Drugs)   Current Outpatient Medications (Other)  Medication Sig  . aspirin 81 MG EC tablet Take 1 tablet (81 mg total) by mouth daily.  . carvedilol (COREG) 12.5 MG tablet Take 1 tablet (12.5 mg total) by mouth 2 (two) times daily with a meal. Please keep upcoming appointment in April 2022 for future refills. Thank you  . CRESTOR 40 MG tablet Take 20 mg by mouth daily.  . diphenoxylate-atropine (LOMOTIL) 2.5-0.025 MG per tablet Take 1 tablet by mouth 4 (four) times daily as needed for diarrhea or loose stools.   Marland Kitchen  ENTRESTO 49-51 MG TAKE 1 TABLET BY MOUTH TWICE A DAY  . fish oil-omega-3 fatty acids 1000 MG capsule Take 1 g by mouth daily.   Marland Kitchen levothyroxine (SYNTHROID, LEVOTHROID) 25 MCG tablet Take 25 mcg by mouth daily.  . Melatonin 5 MG TABS Take 5 mg by mouth as needed.   . mirtazapine (REMERON) 30 MG tablet Take 30 mg by mouth at bedtime.  . nitroGLYCERIN (NITROSTAT) 0.4 MG SL tablet Place 1 tablet (0.4 mg total) under the tongue every 5 (five) minutes as needed for chest pain.  Marland Kitchen PRADAXA 150 MG CAPS capsule TAKE 1 CAPSULE BY MOUTH TWICE A DAY  . promethazine (PHENERGAN) 25 MG tablet Take 25 mg by mouth every 6 (six) hours as needed for nausea or vomiting.  Marland Kitchen spironolactone (ALDACTONE) 25 MG tablet Take 0.5 tablets (12.5 mg total) by mouth daily.   No current facility-administered medications for this visit. (Other)      REVIEW OF SYSTEMS:    ALLERGIES Allergies  Allergen Reactions  . Adhesive [Tape] Other (See Comments)    REACTION: "IT BURNS ME"  . Bee Venom Hives and Swelling  . Eggs Or Egg-Derived Products Nausea And Vomiting  . Penicillins Rash    Has patient had a PCN reaction causing immediate rash, facial/tongue/throat swelling, SOB or lightheadedness with hypotension: Yes Has patient had a PCN  reaction causing severe rash involving mucus membranes or skin necrosis: No Has patient had a PCN reaction that required hospitalization No Has patient had a PCN reaction occurring within the last 10 years: No If all of the above answers are "NO", then may proceed with Cephalosporin use.     PAST MEDICAL HISTORY Past Medical History:  Diagnosis Date  . CAD (coronary artery disease)    Anterior MI with emergent PCI followed by emergent CABG x 3 in April of 2007 per Dr. Dorris Fetch with SVG to LAD and SVG to ramus & OM1  . Chronic systolic heart failure (HCC)   . Hyperlipidemia   . ICD (implantable cardioverter-defibrillator) in place   . MI (myocardial infarction) (HCC) 11/2005    Past Surgical History:  Procedure Laterality Date  . CARDIAC CATHETERIZATION  2004, 2007  . CARDIAC DEFIBRILLATOR PLACEMENT  04/2006   [employer]-Dr. Amil Amen  . CAROTID STENT INSERTION    . CHOLECYSTECTOMY    . CORONARY ARTERY BYPASS GRAFT  11/2005  . EP IMPLANTABLE DEVICE N/A 09/06/2015   Procedure: ICD Generator Changeout;  Surgeon: Marinus Maw, MD;  Location: [facility] INVASIVE CV LAB;  Service: Cardiovascular;  Laterality: N/A;  . VASECTOMY      FAMILY HISTORY Family History  Problem Relation Age of Onset  . Heart attack Father     SOCIAL HISTORY Social History   Tobacco Use  . Smoking status: Never Smoker  . Smokeless tobacco: Never Used  Vaping Use  . Vaping Use: Never used  Substance Use Topics  . Alcohol use: No    Alcohol/week: 0.0 standard drinks  . Drug use: No         OPHTHALMIC EXAM:  Base Eye Exam    Visual Acuity (Snellen - Linear)      Right Left   Dist cc 20/20 20/25 -2   Correction: Glasses       Tonometry (Tonopen, 2:50 PM)      Right Left   Pressure 13 12       Pupils      Dark Light Shape React APD   Right 6 4 Round Brisk None   Left 6 4 Round 3 None       Neuro/Psych    Oriented x3: Yes   Mood/Affect: Normal       Dilation    Right eye: 1.0% Mydriacyl, 2.5% Phenylephrine @ 2:51 PM        Slit Lamp and Fundus Exam    External Exam      Right Left   External Normal Normal       Slit Lamp Exam      Right Left   Lids/Lashes Normal Normal   Conjunctiva/Sclera White and quiet White and quiet   Cornea Clear Clear   Anterior Chamber Deep and quiet Deep and quiet   Iris Round and reactive Round and reactive   Lens 1+ Nuclear sclerosis 1+ Nuclear sclerosis   Anterior Vitreous Normal Normal       Fundus Exam      Right Left   Posterior Vitreous Posterior vitreous detachment, no heme    Disc Normal    C/D Ratio 0.2    Macula Normal    Vessels Normal    Periphery scleral depressed exam, 25D, no holes or tears,  retina attached           IMAGING AND PROCEDURES  Imaging and Procedures for 10/10/20  OCT, Retina - OU - Both Eyes  Right Eye Quality was good. Scan locations included subfoveal. Central Foveal Thickness: 310. Progression has no prior data. Findings include normal observations, normal foveal contour.   Left Eye Quality was good. Scan locations included subfoveal. Central Foveal Thickness: 306. Progression has no prior data. Findings include normal foveal contour.   Notes Preretinal vitreous debris OD  Posterior vitreous detachment OS                ASSESSMENT/PLAN:  Vitreous hemorrhage of right eye (HCC) Hemorrhage is cleared, no new findings, no retinal holes or tears, no relation to discomfort from compression of the eye  Posterior vitreous detachment of right eye No holes or tears  Cataract, nuclear sclerotic, right eye Mild thus far      ICD-10-CM   1. Vitreous hemorrhage of right eye (HCC)  H43.11 OCT, Retina - OU - Both Eyes  2. Posterior vitreous detachment of right eye  H43.811   3. Cataract, nuclear sclerotic, right eye  H25.11     1.  Minor discomfort only with compression of the globe, "when he rubs it".  Right eye.  I explained to the patient that compression of the eye is never good, and that most likely this is ciliary body discomfort (muscle) which occurs with prolonged periods of reading and then touching and compression of the eye.  I also explained to the patient that sleeping on the eye, which she "sometimes does" is very bad and may compress the blood supply.  2.  I have reassured the patient there is no pathology based upon his current symptoms.  Follow-up as scheduled or in 1 year  3.  Ophthalmic Meds Ordered this visit:  No orders of the defined types were placed in this encounter.      Return in about 1 year (around 10/10/2021) for DILATE OU, OCT.  There are no Patient Instructions on file for this visit.   Explained  the diagnoses, plan, and follow up with the patient and they expressed understanding.  Patient expressed understanding of the importance of proper follow up care.   Alford Highland Shonnie Poudrier M.D. Diseases & Surgery of the Retina and Vitreous [facility] 10/10/20     Abbreviations: M myopia (nearsighted); A astigmatism; H hyperopia (farsighted); P presbyopia; Mrx spectacle prescription;  CTL contact lenses; OD right eye; OS left eye; OU both eyes  XT exotropia; ET esotropia; PEK punctate epithelial keratitis; PEE punctate epithelial erosions; DES dry eye syndrome; MGD meibomian gland dysfunction; ATs artificial tears; PFAT's preservative free artificial tears; NSC nuclear sclerotic cataract; PSC posterior subcapsular cataract; ERM epi-retinal membrane; PVD posterior vitreous detachment; RD retinal detachment; DM diabetes mellitus; DR diabetic retinopathy; NPDR non-proliferative diabetic retinopathy; PDR proliferative diabetic retinopathy; CSME clinically significant macular edema; DME diabetic macular edema; dbh dot blot hemorrhages; CWS cotton wool spot; POAG primary open angle glaucoma; C/D cup-to-disc ratio; HVF humphrey visual field; GVF goldmann visual field; OCT optical coherence tomography; IOP intraocular pressure; BRVO Branch retinal vein occlusion; CRVO central retinal vein occlusion; CRAO central retinal artery occlusion; BRAO branch retinal artery occlusion; RT retinal tear; SB scleral buckle; PPV pars plana vitrectomy; VH Vitreous hemorrhage; PRP panretinal laser photocoagulation; IVK intravitreal kenalog; VMT vitreomacular traction; MH Macular hole;  NVD neovascularization of the disc; NVE neovascularization elsewhere; AREDS age related eye disease study; ARMD age related macular degeneration; POAG primary open angle glaucoma; EBMD epithelial/anterior basement membrane dystrophy; ACIOL anterior chamber intraocular lens; IOL intraocular lens; PCIOL posterior chamber intraocular lens;  Phaco/IOL phacoemulsification with  intraocular lens placement; [place] photorefractive keratectomy; LASIK laser assisted in situ keratomileusis; HTN hypertension; DM diabetes mellitus; COPD chronic obstructive pulmonary disease

## 2020-10-13 ENCOUNTER — Encounter: Payer: 59 | Admitting: Internal Medicine

## 2020-10-27 ENCOUNTER — Other Ambulatory Visit: Payer: Self-pay | Admitting: Interventional Cardiology

## 2020-11-29 NOTE — Progress Notes (Signed)
Cardiology Office Note:    Date:  11/30/2020   ID:  [name] [name], [name] [name] May 18, 1961, [name]  PCP:  [name], MD  Cardiologist:  Lesleigh Noe, MD   Referring MD: [name], MD   Chief Complaint  Patient presents with  . Congestive Heart Failure  . Coronary Artery Disease    History of Present Illness:    [name] [name] is a 60 y.o. male with a hx of  ostial LAD Taxus DES stent, subsequent acute stent thrombosis 2007, emergency coronary bypass grafting with saphenous vein grafts 2007, chronic systolic heart failure with EF 25-30% January 2016, hyperlipidemia, and AICD.Conversion to Entresto fall 2019.  [name] [name] was seen by Dr. Gala Romney in June 2021.  Evaluation was performed.  The management strategy for systolic dysfunction was strengthened by adding spironolactone 12.5 mg daily to carvedilol and Entresto.  Patient denies shortness of breath.  His physical limitations are related to low back discomfort if he walks too far or exercises on an elliptical.  Otherwise no complaints.  He still works 4 days/week as a Veterinary surgeon.  He is not having angina.  He has not had syncope.  Being followed by the heart failure clinic.  Past Medical History:  Diagnosis Date  . CAD (coronary artery disease)    Anterior MI with emergent PCI followed by emergent CABG x 3 in April of 2007 per Dr. Dorris Fetch with SVG to LAD and SVG to ramus & OM1  . Chronic systolic heart failure (HCC)   . Hyperlipidemia   . ICD (implantable cardioverter-defibrillator) in place   . MI (myocardial infarction) (HCC) 11/2005    Past Surgical History:  Procedure Laterality Date  . CARDIAC CATHETERIZATION  2004, 2007  . CARDIAC DEFIBRILLATOR PLACEMENT  04/2006   [employer]-Dr. Amil Amen  . CAROTID STENT INSERTION    . CHOLECYSTECTOMY    . CORONARY ARTERY BYPASS GRAFT  11/2005  . EP IMPLANTABLE DEVICE N/A 09/06/2015   Procedure: ICD Generator Changeout;  Surgeon: Marinus Maw,  MD;  Location: [facility] INVASIVE CV LAB;  Service: Cardiovascular;  Laterality: N/A;  . VASECTOMY      Current Medications: Current Meds  Medication Sig  . aspirin 81 MG EC tablet Take 1 tablet (81 mg total) by mouth daily.  . carvedilol (COREG) 12.5 MG tablet Take 1 tablet (12.5 mg total) by mouth 2 (two) times daily with a meal. Please keep upcoming appointment in April 2022 for future refills. Thank you  . CRESTOR 40 MG tablet Take 20 mg by mouth daily.  . diphenoxylate-atropine (LOMOTIL) 2.5-0.025 MG per tablet Take 1 tablet by mouth 4 (four) times daily as needed for diarrhea or loose stools.   . fish oil-omega-3 fatty acids 1000 MG capsule Take 1 g by mouth daily.   Marland Kitchen levothyroxine (SYNTHROID, LEVOTHROID) 25 MCG tablet Take 25 mcg by mouth daily.  . Melatonin 5 MG TABS Take 5 mg by mouth as needed.   . mirtazapine (REMERON) 30 MG tablet Take 30 mg by mouth at bedtime.  . nitroGLYCERIN (NITROSTAT) 0.4 MG SL tablet Place 1 tablet (0.4 mg total) under the tongue every 5 (five) minutes as needed for chest pain.  Marland Kitchen PRADAXA 150 MG CAPS capsule TAKE 1 CAPSULE BY MOUTH TWICE A DAY  . promethazine (PHENERGAN) 25 MG tablet Take 25 mg by mouth every 6 (six) hours as needed for nausea or vomiting.  . sacubitril-valsartan (ENTRESTO) 49-51 MG Take 1 tablet by mouth 2 (two) times  daily. Please keep upcoming appt in April 2022 with Dr. Katrinka Blazing before anymore refills. Thank you  . spironolactone (ALDACTONE) 25 MG tablet Take 0.5 tablets (12.5 mg total) by mouth daily.     Allergies:   Adhesive [tape], Bee venom, Eggs or egg-derived products, and Penicillins   Social History   Socioeconomic History  . Marital status: Married    Spouse name: Not on file  . Number of children: Not on file  . Years of education: Not on file  . Highest education level: Not on file  Occupational History  . Not on file  Tobacco Use  . Smoking status: Never Smoker  . Smokeless tobacco: Never Used  Vaping Use  . Vaping  Use: Never used  Substance and Sexual Activity  . Alcohol use: No    Alcohol/week: 0.0 standard drinks  . Drug use: No  . Sexual activity: Not on file  Other Topics Concern  . Not on file  Social History Narrative  . Not on file   Social Determinants of Health   Financial Resource Strain: Not on file  Food Insecurity: Not on file  Transportation Needs: Not on file  Physical Activity: Not on file  Stress: Not on file  Social Connections: Not on file     Family History: The patient's family history includes Heart attack in his father.  ROS:   Please see the history of present illness.    After spironolactone was started he had a few episodes of lightheadedness.  This is resolved.  Palpitations have resolved.  Low back discomfort occurs if physical activity is too vigorous.  All other systems reviewed and are negative.  EKGs/Labs/Other Studies Reviewed:    The following studies were reviewed today: No new functional data  EKG:  EKG sinus rhythm, right bundle, left anterior hemiblock.  Recent Labs: 02/08/2020: BUN 13; Creatinine, Ser 1.22; Potassium 4.2; Sodium 139  Recent Lipid Panel No results found for: CHOL, TRIG, HDL, CHOLHDL, VLDL, LDLCALC, LDLDIRECT  Physical Exam:    VS:  BP 110/64   Pulse 61   Ht 6' (1.829 m)   Wt 222 lb (100.7 kg)   SpO2 97%   BMI 30.11 kg/m     Wt Readings from Last 3 Encounters:  11/30/20 222 lb (100.7 kg)  01/18/20 219 lb (99.3 kg)  11/03/19 228 lb (103.4 kg)     GEN: Overweight. No acute distress HEENT: Normal NECK: No JVD. LYMPHATICS: No lymphadenopathy CARDIAC: No murmur. RRR no gallop, or edema. VASCULAR:  Normal Pulses. No bruits. RESPIRATORY:  Clear to auscultation without rales, wheezing or rhonchi  ABDOMEN: Soft, non-tender, non-distended, No pulsatile mass, MUSCULOSKELETAL: No deformity  SKIN: Warm and dry NEUROLOGIC:  Alert and oriented x 3 PSYCHIATRIC:  Normal affect   ASSESSMENT:    1. Chronic systolic heart  failure (HCC)   2. CAD in native artery   3. Paroxysmal atrial fibrillation (HCC)   4. ICD (implantable cardioverter-defibrillator) in place   5. Chronic anticoagulation   6. Essential hypertension   7. Other hyperlipidemia    PLAN:    In order of problems listed above:  1. Continue three-pronged approach to systolic dysfunction which includes Entresto, carvedilol, and spironolactone.  Needs 2-3 times per year potassium and kidney function evaluation.  The patient tells me he will be seeing his primary physician within the next month and blood work will be done.  He should call if shortness of breath or swelling. 2. Continue secondary prevention measures including keeping  LDL less than 70. 3. Continue Pradaxa. 4. Continue follow-up in device clinic. 5. Continue Pradaxa.  Twice yearly CBC. 6. Blood pressure control is excellent. 7. Continue Crestor 40 mg/day.   Medication Adjustments/Labs and Tests Ordered: Current medicines are reviewed at length with the patient today.  Concerns regarding medicines are outlined above.  Orders Placed This Encounter  Procedures  . EKG 12-Lead   No orders of the defined types were placed in this encounter.   Patient Instructions  Medication Instructions:  Your physician recommends that you continue on your current medications as directed. Please refer to the Current Medication list given to you today.  *If you need a refill on your cardiac medications before your next appointment, please call your pharmacy*   Lab Work: None If you have labs (blood work) drawn today and your tests are completely normal, you will receive your results only by: Marland Kitchen MyChart Message (if you have MyChart) OR . A paper copy in the mail If you have any lab test that is abnormal or we need to change your treatment, we will call you to review the results.   Testing/Procedures: None   Follow-Up: At [facility], you and your health needs are our priority.  As part  of our continuing mission to provide you with exceptional heart care, we have created designated Provider Care Teams.  These Care Teams include your primary Cardiologist (physician) and Advanced Practice Providers (APPs -  Physician Assistants and Nurse Practitioners) who all work together to provide you with the care you need, when you need it.  We recommend signing up for the patient portal called "MyChart".  Sign up information is provided on this After Visit Summary.  MyChart is used to connect with patients for Virtual Visits (Telemedicine).  Patients are able to view lab/test results, encounter notes, upcoming appointments, etc.  Non-urgent messages can be sent to your provider as well.   To learn more about what you can do with MyChart, go to [email or web address].    Your next appointment:   1 year(s)  The format for your next appointment:   In Person  Provider:   You may see Lesleigh Noe, MD or one of the following Advanced Practice Providers on your designated Care Team:    Georgie Chard, NP    Other Instructions      Signed, Lesleigh Noe, MD  11/30/2020 3:57 PM    [facility]

## 2020-11-30 ENCOUNTER — Other Ambulatory Visit: Payer: Self-pay

## 2020-11-30 ENCOUNTER — Ambulatory Visit: Payer: 59 | Admitting: Interventional Cardiology

## 2020-11-30 ENCOUNTER — Encounter: Payer: Self-pay | Admitting: Interventional Cardiology

## 2020-11-30 VITALS — BP 110/64 | HR 61 | Ht 72.0 in | Wt 222.0 lb

## 2020-11-30 DIAGNOSIS — I10 Essential (primary) hypertension: Secondary | ICD-10-CM

## 2020-11-30 DIAGNOSIS — Z7901 Long term (current) use of anticoagulants: Secondary | ICD-10-CM

## 2020-11-30 DIAGNOSIS — Z95810 Presence of automatic (implantable) cardiac defibrillator: Secondary | ICD-10-CM

## 2020-11-30 DIAGNOSIS — I2510 Atherosclerotic heart disease of native coronary artery without angina pectoris: Secondary | ICD-10-CM

## 2020-11-30 DIAGNOSIS — I480 Paroxysmal atrial fibrillation: Secondary | ICD-10-CM | POA: Diagnosis not present

## 2020-11-30 DIAGNOSIS — I5022 Chronic systolic (congestive) heart failure: Secondary | ICD-10-CM

## 2020-11-30 DIAGNOSIS — E7849 Other hyperlipidemia: Secondary | ICD-10-CM

## 2020-11-30 NOTE — Patient Instructions (Signed)

## 2020-12-07 ENCOUNTER — Ambulatory Visit: Payer: 59 | Admitting: Internal Medicine

## 2020-12-07 ENCOUNTER — Encounter: Payer: Self-pay | Admitting: Internal Medicine

## 2020-12-07 ENCOUNTER — Other Ambulatory Visit: Payer: Self-pay

## 2020-12-07 VITALS — BP 110/64 | HR 56 | Ht 72.0 in | Wt 221.0 lb

## 2020-12-07 DIAGNOSIS — Z95810 Presence of automatic (implantable) cardiac defibrillator: Secondary | ICD-10-CM

## 2020-12-07 DIAGNOSIS — I5022 Chronic systolic (congestive) heart failure: Secondary | ICD-10-CM | POA: Diagnosis not present

## 2020-12-07 DIAGNOSIS — I255 Ischemic cardiomyopathy: Secondary | ICD-10-CM | POA: Diagnosis not present

## 2020-12-07 DIAGNOSIS — I480 Paroxysmal atrial fibrillation: Secondary | ICD-10-CM

## 2020-12-07 NOTE — Patient Instructions (Addendum)
Medication Instructions:  Your physician recommends that you continue on your current medications as directed. Please refer to the Current Medication list given to you today.  Labwork: None ordered.  Testing/Procedures: None ordered.  Follow-Up:  Your physician wants you to follow-up in: 6 months with Francis Dowse, PA.  You will receive a reminder letter in the mail two months in advance. If you don't receive a letter, please call our office to schedule the follow-up appointment.  Your physician wants you to follow-up in: one year with Lewayne Bunting, MD or one of the following Advanced Practice Providers on your designated Care Team:    Gypsy Balsam, NP  Francis Dowse, PA-C  Casimiro Needle "Mardelle Matte" Lanna Poche, [place]   Any Other Special Instructions Will Be Listed Below (If Applicable).  If you need a refill on your cardiac medications before your next appointment, please call your pharmacy.

## 2020-12-07 NOTE — Progress Notes (Signed)
HPI Mr. Mandeville returns today for ICD followup. He is a pleasant 60 yo man with a h/o CAD ,s/p MI, CABG, with persistent LV dysfunction and class 2 CHF, s/p ICD insertion. In the interim, he notes that he done well with no recurrent Covid infection and no chest pain, palpitations or sob. No edema. He gets lightheaded rarely. Allergies  Allergen Reactions  . Adhesive [Tape] Other (See Comments)    REACTION: "IT BURNS ME"  . Bee Venom Hives and Swelling  . Eggs Or Egg-Derived Products Nausea And Vomiting  . Penicillins Rash    Has patient had a PCN reaction causing immediate rash, facial/tongue/throat swelling, SOB or lightheadedness with hypotension: Yes Has patient had a PCN reaction causing severe rash involving mucus membranes or skin necrosis: No Has patient had a PCN reaction that required hospitalization No Has patient had a PCN reaction occurring within the last 10 years: No If all of the above answers are "NO", then may proceed with Cephalosporin use.      Current Outpatient Medications  Medication Sig Dispense Refill  . aspirin 81 MG EC tablet Take 1 tablet (81 mg total) by mouth daily.    . carvedilol (COREG) 12.5 MG tablet Take 1 tablet (12.5 mg total) by mouth 2 (two) times daily with a meal. Please keep upcoming appointment in April 2022 for future refills. Thank you 180 tablet 0  . CRESTOR 40 MG tablet Take 20 mg by mouth daily.  5  . diphenoxylate-atropine (LOMOTIL) 2.5-0.025 MG per tablet Take 1 tablet by mouth 4 (four) times daily as needed for diarrhea or loose stools.     . fish oil-omega-3 fatty acids 1000 MG capsule Take 1 g by mouth daily.     Marland Kitchen levothyroxine (SYNTHROID, LEVOTHROID) 25 MCG tablet Take 25 mcg by mouth daily.    . Melatonin 5 MG TABS Take 5 mg by mouth as needed.     . mirtazapine (REMERON) 30 MG tablet Take 30 mg by mouth at bedtime.    . nitroGLYCERIN (NITROSTAT) 0.4 MG SL tablet Place 1 tablet (0.4 mg total) under the tongue every 5  (five) minutes as needed for chest pain. 25 tablet 4  . PRADAXA 150 MG CAPS capsule TAKE 1 CAPSULE BY MOUTH TWICE A DAY 180 capsule 1  . promethazine (PHENERGAN) 25 MG tablet Take 25 mg by mouth every 6 (six) hours as needed for nausea or vomiting.    . sacubitril-valsartan (ENTRESTO) 49-51 MG Take 1 tablet by mouth 2 (two) times daily. Please keep upcoming appt in April 2022 with Dr. Katrinka Blazing before anymore refills. Thank you 180 tablet 0  . spironolactone (ALDACTONE) 25 MG tablet Take 0.5 tablets (12.5 mg total) by mouth daily. 15 tablet 6   No current facility-administered medications for this visit.     Past Medical History:  Diagnosis Date  . CAD (coronary artery disease)    Anterior MI with emergent PCI followed by emergent CABG x 3 in April of 2007 per Dr. Dorris Fetch with SVG to LAD and SVG to ramus & OM1  . Chronic systolic heart failure (HCC)   . Hyperlipidemia   . ICD (implantable cardioverter-defibrillator) in place   . MI (myocardial infarction) (HCC) 11/2005    ROS:   All systems reviewed and negative except as noted in the HPI.   Past Surgical History:  Procedure Laterality Date  . CARDIAC CATHETERIZATION  2004, 2007  . CARDIAC DEFIBRILLATOR PLACEMENT  04/2006   [employer]  [facility]-Dr. Amil Amen  . CAROTID STENT INSERTION    . CHOLECYSTECTOMY    . CORONARY ARTERY BYPASS GRAFT  11/2005  . EP IMPLANTABLE DEVICE N/A 09/06/2015   Procedure: ICD Generator Changeout;  Surgeon: Marinus Maw, MD;  Location: [facility] INVASIVE CV LAB;  Service: Cardiovascular;  Laterality: N/A;  . VASECTOMY       Family History  Problem Relation Age of Onset  . Heart attack Father      Social History   Socioeconomic History  . Marital status: Married    Spouse name: Not on file  . Number of children: Not on file  . Years of education: Not on file  . Highest education level: Not on file  Occupational History  . Not on file  Tobacco Use  . Smoking status: Never Smoker  . Smokeless  tobacco: Never Used  Vaping Use  . Vaping Use: Never used  Substance and Sexual Activity  . Alcohol use: No    Alcohol/week: 0.0 standard drinks  . Drug use: No  . Sexual activity: Not on file  Other Topics Concern  . Not on file  Social History Narrative  . Not on file   Social Determinants of Health   Financial Resource Strain: Not on file  Food Insecurity: Not on file  Transportation Needs: Not on file  Physical Activity: Not on file  Stress: Not on file  Social Connections: Not on file  Intimate Partner Violence: Not on file     BP 110/64   Pulse (!) 56   Ht 6' (1.829 m)   Wt 221 lb (100.2 kg)   SpO2 94%   BMI 29.97 kg/m   Physical Exam:  Well appearing NAD HEENT: Unremarkable Neck:  No JVD, no thyromegally Lymphatics:  No adenopathy Back:  No CVA tenderness Lungs:  Clear with no wheezes HEART:  Regular rate rhythm, no murmurs, no rubs, no clicks Abd:  soft, positive bowel sounds, no organomegally, no rebound, no guarding Ext:  2 plus pulses, no edema, no cyanosis, no clubbing Skin:  No rashes no nodules Neuro:  CN II through XII intact, motor grossly intact   DEVICE  Normal device function.  See PaceArt for details.   Assess/Plan: 1. Chronic systolic heart failure - his symptoms are class 2. He will continue his current meds. 2. VT - he has had no recurrent ICD therapy. He will continue his current meds.  3. PAF - he has been in NSR 99.9 % of the time. He will continue his current meds.  4. ICD - his single chamber ICD is working normally. We will check him in 6 months with our PA and me back in a year.  Sharlot Gowda Reneta Niehaus,MD

## 2020-12-31 ENCOUNTER — Other Ambulatory Visit: Payer: Self-pay | Admitting: Interventional Cardiology

## 2021-01-17 ENCOUNTER — Other Ambulatory Visit: Payer: Self-pay | Admitting: Interventional Cardiology

## 2021-01-18 NOTE — Telephone Encounter (Signed)
Pt last saw Dr [name] 12/07/20, last labs 02/08/20 Creat 1.22, age 60, weight 100.2kg, CrCl 90.12, based on CrCl pt is on appropriate dosage of Pradaxa 150mg  BID.  Will refill rx.

## 2021-01-22 ENCOUNTER — Other Ambulatory Visit: Payer: Self-pay | Admitting: Interventional Cardiology

## 2021-03-05 ENCOUNTER — Other Ambulatory Visit (HOSPITAL_COMMUNITY): Payer: Self-pay | Admitting: *Deleted

## 2021-03-05 MED ORDER — SPIRONOLACTONE 25 MG PO TABS: 12.5000 mg | ORAL_TABLET | Freq: Every day | ORAL | 6 refills | Status: DC

## 2021-05-12 ENCOUNTER — Other Ambulatory Visit: Payer: Self-pay | Admitting: Internal Medicine

## 2021-05-14 DIAGNOSIS — E7800 Pure hypercholesterolemia, unspecified: Secondary | ICD-10-CM | POA: Diagnosis not present

## 2021-05-14 DIAGNOSIS — G4700 Insomnia, unspecified: Secondary | ICD-10-CM | POA: Diagnosis not present

## 2021-05-14 DIAGNOSIS — Z0000 Encounter for general adult medical examination without abnormal findings: Secondary | ICD-10-CM | POA: Diagnosis not present

## 2021-05-14 DIAGNOSIS — Z125 Encounter for screening for malignant neoplasm of prostate: Secondary | ICD-10-CM | POA: Diagnosis not present

## 2021-05-14 DIAGNOSIS — I10 Essential (primary) hypertension: Secondary | ICD-10-CM | POA: Diagnosis not present

## 2021-05-14 DIAGNOSIS — E039 Hypothyroidism, unspecified: Secondary | ICD-10-CM | POA: Diagnosis not present

## 2021-06-22 DIAGNOSIS — R3121 Asymptomatic microscopic hematuria: Secondary | ICD-10-CM | POA: Diagnosis not present

## 2021-07-05 ENCOUNTER — Other Ambulatory Visit: Payer: Self-pay | Admitting: Internal Medicine

## 2021-07-05 NOTE — Telephone Encounter (Signed)
Prescription refill request for Pradaxa received.  Indication: Afib  Last office visit:12/07/20 [name])  Weight: 100.2kg Age: 60 Scr: 1.29 (05/14/21) CrCl: 85.11ml/min  Appropriate dose and refill sent to requested pharmacy.

## 2021-07-11 DIAGNOSIS — Z23 Encounter for immunization: Secondary | ICD-10-CM | POA: Diagnosis not present

## 2021-09-22 ENCOUNTER — Encounter (HOSPITAL_COMMUNITY): Payer: Self-pay | Admitting: Internal Medicine

## 2021-09-23 ENCOUNTER — Other Ambulatory Visit (HOSPITAL_COMMUNITY): Payer: Self-pay | Admitting: Internal Medicine

## 2021-09-24 ENCOUNTER — Telehealth: Payer: Self-pay

## 2021-09-24 ENCOUNTER — Other Ambulatory Visit (HOSPITAL_COMMUNITY): Payer: Self-pay | Admitting: *Deleted

## 2021-09-24 MED ORDER — SPIRONOLACTONE 25 MG PO TABS: 12.5000 mg | ORAL_TABLET | Freq: Every day | ORAL | 6 refills | Status: DC

## 2021-09-24 NOTE — Telephone Encounter (Signed)
The patient called to get help with his monitor. [employer] is sending a new handheld in 7-10 business days.

## 2021-10-02 ENCOUNTER — Encounter: Payer: Self-pay | Admitting: Internal Medicine

## 2021-10-02 ENCOUNTER — Other Ambulatory Visit: Payer: Self-pay | Admitting: Internal Medicine

## 2021-10-02 ENCOUNTER — Telehealth: Payer: Self-pay | Admitting: Pharmacist

## 2021-10-02 MED ORDER — APIXABAN 5 MG PO TABS: 5.0000 mg | ORAL_TABLET | Freq: Two times a day (BID) | ORAL | 1 refills | Status: DC

## 2021-10-02 NOTE — Telephone Encounter (Signed)
Pt used to take Eliquis, looks like he was changed to Pradaxa in 2018 because it was cheaper on his insurance plan. Now Pradaxa is not covered. Pt is fine with changing back to Eliquis. He can use a $10/month copay card which I have activated and sent to his pharmacy along with Eliquis rx. ID [id number], BIN [id number], PCN LOYALTY, GRP [id number]. He is aware to take first dose of Eliquis 12 hours after his last dose of Pradaxa.

## 2021-10-02 NOTE — Telephone Encounter (Signed)
Pt last saw Dr Lovena Le 12/07/20, last labs 05/14/21 Creat 1.29, age 61, weight 100.2kg, CrCl 84.15, based on CrCl pt is on appropriate dosage of Pradaxa 150mg  BID for afib.  Will refill rx.

## 2021-10-02 NOTE — Telephone Encounter (Signed)
[name] [name], RPH-CPP Pharmacist Pharmacy Telephone Encounter Sign at exiting of workspace Creation Time:  10/02/2021  3:53 PM   Sign at exiting of workspace      Pt used to take Eliquis, looks like he was changed to Pradaxa in 2018 because it was cheaper on his insurance plan. Now Pradaxa is not covered. Pt is fine with changing back to Eliquis. He can use a $10/month copay card which I have activated and sent to his pharmacy along with Eliquis rx. ID [id number], BIN [id number], PCN LOYALTY, GRP [id number]. He is aware to start Eliquis 12 hours after his last dose of Pradaxa. He was appreciative for the assistance.         [name] Sark, RN  Cv Div Pharmd 7 hours ago (8:30 AM)   Looks like pharmacy is requesting alternate DOAC or PA for this medication per the unsigned order.  Looks like Pradaxa is not covered by the pt's insurance.  Not sure who I should send this to.  Please address.  Thanks      [name] Sark, RN  Registered Nurse Telephone Encounter Signed Creation Time:  10/02/2021  8:21 AM   Signed      Pt last saw Dr [name] [name] 12/07/20, last labs 05/14/21 Creat 1.29, age 61, weight 100.2kg, CrCl 84.15, based on CrCl pt is on appropriate dosage of Pradaxa 150mg  BID for afib.  Will refill rx.

## 2021-10-09 DIAGNOSIS — D225 Melanocytic nevi of trunk: Secondary | ICD-10-CM | POA: Diagnosis not present

## 2021-10-09 DIAGNOSIS — D1801 Hemangioma of skin and subcutaneous tissue: Secondary | ICD-10-CM | POA: Diagnosis not present

## 2021-10-09 DIAGNOSIS — L570 Actinic keratosis: Secondary | ICD-10-CM | POA: Diagnosis not present

## 2021-10-09 DIAGNOSIS — L738 Other specified follicular disorders: Secondary | ICD-10-CM | POA: Diagnosis not present

## 2021-10-09 DIAGNOSIS — L820 Inflamed seborrheic keratosis: Secondary | ICD-10-CM | POA: Diagnosis not present

## 2021-10-09 DIAGNOSIS — L814 Other melanin hyperpigmentation: Secondary | ICD-10-CM | POA: Diagnosis not present

## 2021-10-11 ENCOUNTER — Encounter (INDEPENDENT_AMBULATORY_CARE_PROVIDER_SITE_OTHER): Payer: 59 | Admitting: Ophthalmology

## 2021-10-16 ENCOUNTER — Ambulatory Visit (INDEPENDENT_AMBULATORY_CARE_PROVIDER_SITE_OTHER): Payer: BC Managed Care – PPO | Admitting: Ophthalmology

## 2021-10-16 ENCOUNTER — Other Ambulatory Visit: Payer: Self-pay

## 2021-10-16 DIAGNOSIS — H43811 Vitreous degeneration, right eye: Secondary | ICD-10-CM

## 2021-10-16 DIAGNOSIS — H4311 Vitreous hemorrhage, right eye: Secondary | ICD-10-CM | POA: Diagnosis not present

## 2021-10-16 DIAGNOSIS — H2512 Age-related nuclear cataract, left eye: Secondary | ICD-10-CM | POA: Diagnosis not present

## 2021-10-16 DIAGNOSIS — H2511 Age-related nuclear cataract, right eye: Secondary | ICD-10-CM | POA: Diagnosis not present

## 2021-10-16 DIAGNOSIS — H2513 Age-related nuclear cataract, bilateral: Secondary | ICD-10-CM

## 2021-10-16 NOTE — Assessment & Plan Note (Signed)
Completely resolved, no new breaks

## 2021-10-16 NOTE — Assessment & Plan Note (Signed)
Bilateral mild cataract, no intervention required

## 2021-10-16 NOTE — Assessment & Plan Note (Signed)
See comments similar to the left

## 2021-10-16 NOTE — Assessment & Plan Note (Signed)
PVD, associated hemorrhage from 1 year ago completely resolved

## 2021-10-16 NOTE — Progress Notes (Signed)
10/16/2021     CHIEF COMPLAINT Patient presents for  Chief Complaint  Patient presents with   Retina Evaluation      HISTORY OF PRESENT ILLNESS: [name] [name] is a 61 y.o. male who presents to the clinic today for:   HPI     Retina Evaluation           Laterality: both eyes         Comments   History of vitreous hemorrhage in the right eye years past with no associated retinal tears found.  No interval change in symptoms      Last edited by Edmon Crape, MD on 10/16/2021  1:22 PM.      Referring physician: Earnstine Regal, OD [address] [place],  [place] [address]  HISTORICAL INFORMATION:   Selected notes from the MEDICAL RECORD NUMBER       CURRENT MEDICATIONS: No current outpatient medications on file. (Ophthalmic Drugs)   No current facility-administered medications for this visit. (Ophthalmic Drugs)   Current Outpatient Medications (Other)  Medication Sig   apixaban (ELIQUIS) 5 MG TABS tablet Take 1 tablet (5 mg total) by mouth 2 (two) times daily.   aspirin 81 MG EC tablet Take 1 tablet (81 mg total) by mouth daily.   carvedilol (COREG) 12.5 MG tablet TAKE 1 TABLET BY MOUTH TWICE A DAY WITH A MEAL   CRESTOR 40 MG tablet Take 20 mg by mouth daily.   diphenoxylate-atropine (LOMOTIL) 2.5-0.025 MG per tablet Take 1 tablet by mouth 4 (four) times daily as needed for diarrhea or loose stools.    fish oil-omega-3 fatty acids 1000 MG capsule Take 1 g by mouth daily.    levothyroxine (SYNTHROID, LEVOTHROID) 25 MCG tablet Take 25 mcg by mouth daily.   Melatonin 5 MG TABS Take 5 mg by mouth as needed.    mirtazapine (REMERON) 30 MG tablet Take 30 mg by mouth at bedtime.   nitroGLYCERIN (NITROSTAT) 0.4 MG SL tablet PLACE 1 TABLET UNDER THE TONGUE EVERY 5 MINUTES AS NEEDED FOR CHEST PAIN   promethazine (PHENERGAN) 25 MG tablet Take 25 mg by mouth every 6 (six) hours as needed for nausea or vomiting.   sacubitril-valsartan (ENTRESTO) 49-51 MG Take 1 tablet  by mouth 2 (two) times daily.   spironolactone (ALDACTONE) 25 MG tablet Take 0.5 tablets (12.5 mg total) by mouth daily.   No current facility-administered medications for this visit. (Other)      REVIEW OF SYSTEMS: ROS   Negative for: Constitutional, Gastrointestinal, Neurological, Skin, Genitourinary, Musculoskeletal, HENT, Endocrine, Cardiovascular, Eyes, Respiratory, Psychiatric, Allergic/Imm, Heme/Lymph Last edited by Edmon Crape, MD on 10/16/2021  1:21 PM.       ALLERGIES Allergies  Allergen Reactions   Adhesive [Tape] Other (See Comments)    REACTION: "IT BURNS ME"   Bee Venom Hives and Swelling   Eggs Or Egg-Derived Products Nausea And Vomiting   Penicillins Rash    Has patient had a PCN reaction causing immediate rash, facial/tongue/throat swelling, SOB or lightheadedness with hypotension: Yes Has patient had a PCN reaction causing severe rash involving mucus membranes or skin necrosis: No Has patient had a PCN reaction that required hospitalization No Has patient had a PCN reaction occurring within the last 10 years: No If all of the above answers are "NO", then may proceed with Cephalosporin use.     PAST MEDICAL HISTORY Past Medical History:  Diagnosis Date   CAD (coronary artery disease)    Anterior  MI with emergent PCI followed by emergent CABG x 3 in April of 2007 per Dr. Roxan Hockey with SVG to LAD and SVG to ramus & OM1   Chronic systolic heart failure ([place])    Hyperlipidemia    ICD (implantable cardioverter-defibrillator) in place    MI (myocardial infarction) ([place]) 11/2005   Past Surgical History:  Procedure Laterality Date   CARDIAC CATHETERIZATION  2004, 2007   CARDIAC DEFIBRILLATOR PLACEMENT  04/2006   [employer] [facility]-Dr. Leonia Reeves   CAROTID STENT INSERTION     CHOLECYSTECTOMY     CORONARY ARTERY BYPASS GRAFT  11/2005   EP IMPLANTABLE DEVICE N/A 09/06/2015   Procedure: ICD Generator Changeout;  Surgeon: Evans Lance, MD;  Location: [facility]  CV LAB;  Service: Cardiovascular;  Laterality: N/A;   VASECTOMY      FAMILY HISTORY Family History  Problem Relation Age of Onset   Heart attack Father     SOCIAL HISTORY Social History   Tobacco Use   Smoking status: Never   Smokeless tobacco: Never  Vaping Use   Vaping Use: Never used  Substance Use Topics   Alcohol use: No    Alcohol/week: 0.0 standard drinks   Drug use: No         OPHTHALMIC EXAM:  Base Eye Exam     Visual Acuity (ETDRS)       Right Left   Dist cc 20/20 -2 20/25 -2         Tonometry (Tonopen, 1:22 PM)       Right Left   Pressure 12 12         Pupils       Pupils APD   Right PERRL None   Left PERRL None         Visual Fields       Left Right    Full Full         Extraocular Movement       Right Left    Full, Ortho Full, Ortho         Neuro/Psych     Oriented x3: Yes   Mood/Affect: Normal           Slit Lamp and Fundus Exam     External Exam       Right Left   External Normal Normal         Slit Lamp Exam       Right Left   Lids/Lashes Normal Normal   Conjunctiva/Sclera White and quiet White and quiet   Cornea Clear Clear   Anterior Chamber Deep and quiet Deep and quiet   Iris Round and reactive Round and reactive   Lens 1+ Nuclear sclerosis 1+ Nuclear sclerosis   Anterior Vitreous Normal Normal         Fundus Exam       Right Left   Posterior Vitreous Posterior vitreous detachment, no heme Normal   Disc Normal Normal   C/D Ratio 0.1 0.1   Macula Normal Normal   Vessels Normal Normal   Periphery 25D, no holes or tears, retina attached 25D, no holes or tears, retina attached            IMAGING AND PROCEDURES  Imaging and Procedures for 10/16/21  Color Fundus Photography Optos - OU - Both Eyes       Right Eye Progression has improved. Disc findings include normal observations. Macula : normal observations. Vessels : normal observations. Periphery : normal observations.    Left  Eye Progression has been stable. Disc findings include normal observations. Vessels : normal observations. Periphery : normal observations.   Notes Vitreous hemorrhage centrally OD has abated, and resolved residual PVD, periphery, vessels, macula nerves all normal               ASSESSMENT/PLAN:  Nuclear sclerotic cataract of left eye Bilateral mild cataract, no intervention required  Cataract, nuclear sclerotic, right eye See comments similar to the left  Posterior vitreous detachment of right eye PVD, associated hemorrhage from 1 year ago completely resolved  Vitreous hemorrhage of right eye ([place]) Completely resolved, no new breaks     ICD-10-CM   1. Vitreous hemorrhage of right eye ([place])  H43.11 Color Fundus Photography Optos - OU - Both Eyes    2. Nuclear sclerotic cataract of left eye  H25.12     3. Cataract, nuclear sclerotic, right eye  H25.11     4. Posterior vitreous detachment of right eye  H43.811       1.  OD, history of PVD with significant vitreous hemorrhage, no retinal holes or tears seen then 1 year previous nor now.  2.  OS no PVD is seen today or detected clinically  3.  Bilateral cataract, likely contributes soon to the difficulties with night vision or driving and dark rainy day  Ophthalmic Meds Ordered this visit:  No orders of the defined types were placed in this encounter.      Return in about 2 years (around 10/17/2023) for DILATE OU, COLOR FP, OCT.  There are no Patient Instructions on file for this visit.   Explained the diagnoses, plan, and follow up with the patient and they expressed understanding.  Patient expressed understanding of the importance of proper follow up care.   Clent Demark Luvenia Cranford M.D. Diseases & Surgery of the Retina and Vitreous [facility]ina [facility] [place] 10/16/21     Abbreviations: M myopia (nearsighted); A astigmatism; H hyperopia (farsighted); P presbyopia; Mrx spectacle prescription;  CTL  contact lenses; OD right eye; OS left eye; OU both eyes  XT exotropia; ET esotropia; PEK punctate epithelial keratitis; PEE punctate epithelial erosions; DES dry eye syndrome; MGD meibomian gland dysfunction; ATs artificial tears; PFAT's preservative free artificial tears; [place] nuclear sclerotic cataract; PSC posterior subcapsular cataract; ERM epi-retinal membrane; PVD posterior vitreous detachment; RD retinal detachment; DM diabetes mellitus; DR diabetic retinopathy; NPDR non-proliferative diabetic retinopathy; PDR proliferative diabetic retinopathy; CSME clinically significant macular edema; DME diabetic macular edema; dbh dot blot hemorrhages; CWS cotton wool spot; POAG primary open angle glaucoma; C/D cup-to-disc ratio; HVF humphrey visual field; GVF goldmann visual field; OCT optical coherence tomography; IOP intraocular pressure; BRVO Branch retinal vein occlusion; CRVO central retinal vein occlusion; CRAO central retinal artery occlusion; BRAO branch retinal artery occlusion; RT retinal tear; SB scleral buckle; PPV pars plana vitrectomy; VH Vitreous hemorrhage; PRP panretinal laser photocoagulation; IVK intravitreal kenalog; VMT vitreomacular traction; MH Macular hole;  NVD neovascularization of the disc; NVE neovascularization elsewhere; AREDS age related eye disease study; ARMD age related macular degeneration; POAG primary open angle glaucoma; EBMD epithelial/anterior basement membrane dystrophy; ACIOL anterior chamber intraocular lens; IOL intraocular lens; PCIOL posterior chamber intraocular lens; Phaco/IOL phacoemulsification with intraocular lens placement; [place] photorefractive keratectomy; LASIK laser assisted in situ keratomileusis; HTN hypertension; DM diabetes mellitus; COPD chronic obstructive pulmonary disease

## 2021-12-10 ENCOUNTER — Ambulatory Visit: Payer: BC Managed Care – PPO | Admitting: Internal Medicine

## 2021-12-10 ENCOUNTER — Encounter: Payer: Self-pay | Admitting: Internal Medicine

## 2021-12-10 VITALS — BP 100/70 | HR 63 | Ht 72.0 in | Wt 219.0 lb

## 2021-12-10 DIAGNOSIS — I255 Ischemic cardiomyopathy: Secondary | ICD-10-CM

## 2021-12-10 DIAGNOSIS — I480 Paroxysmal atrial fibrillation: Secondary | ICD-10-CM | POA: Diagnosis not present

## 2021-12-10 DIAGNOSIS — Z95810 Presence of automatic (implantable) cardiac defibrillator: Secondary | ICD-10-CM

## 2021-12-10 DIAGNOSIS — I5022 Chronic systolic (congestive) heart failure: Secondary | ICD-10-CM

## 2021-12-10 NOTE — Progress Notes (Signed)
? ? ? ? ?HPI ?[name] returns today for ICD followup. He is a pleasant 60yo man with a h/o CAD ,s/p MI, CABG, with persistent LV dysfunction and class 2 CHF, s/p ICD insertion. In the interim, he notes that he done well with no recurrent Covid infection and no chest pain, palpitations or sob. No edema. He gets lightheaded rarely. He tells me on 2/28 and 3/1 he was dizzy for a few minutes which did not correlate with any arrhythmias. ?Allergies  ?Allergen Reactions  ? Adhesive [Tape] Other (See Comments)  ?  REACTION: "IT BURNS ME"  ? Bee Venom Hives and Swelling  ? Eggs Or Egg-Derived Products Nausea And Vomiting  ? Penicillins Rash  ?  Has patient had a PCN reaction causing immediate rash, facial/tongue/throat swelling, SOB or lightheadedness with hypotension: Yes ?Has patient had a PCN reaction causing severe rash involving mucus membranes or skin necrosis: No ?Has patient had a PCN reaction that required hospitalization No ?Has patient had a PCN reaction occurring within the last 10 years: No ?If all of the above answers are "NO", then may proceed with Cephalosporin use. ?  ? ? ? ?Current Outpatient Medications  ?Medication Sig Dispense Refill  ? apixaban (ELIQUIS) 5 MG TABS tablet Take 1 tablet (5 mg total) by mouth 2 (two) times daily. 180 tablet 1  ? aspirin 81 MG EC tablet Take 1 tablet (81 mg total) by mouth daily.    ? carvedilol (COREG) 12.5 MG tablet TAKE 1 TABLET BY MOUTH TWICE A DAY WITH A MEAL 180 tablet 3  ? CRESTOR 40 MG tablet Take 20 mg by mouth daily.  5  ? diphenoxylate-atropine (LOMOTIL) 2.5-0.025 MG per tablet Take 1 tablet by mouth 4 (four) times daily as needed for diarrhea or loose stools.     ? fish oil-omega-3 fatty acids 1000 MG capsule Take 1 g by mouth daily.     ? levothyroxine (SYNTHROID, LEVOTHROID) 25 MCG tablet Take 25 mcg by mouth daily.    ? mirtazapine (REMERON) 30 MG tablet Take 30 mg by mouth at bedtime.    ? nitroGLYCERIN (NITROSTAT) 0.4 MG SL tablet PLACE 1 TABLET  UNDER THE TONGUE EVERY 5 MINUTES AS NEEDED FOR CHEST PAIN 25 tablet 4  ? promethazine (PHENERGAN) 25 MG tablet Take 25 mg by mouth every 6 (six) hours as needed for nausea or vomiting.    ? sacubitril-valsartan (ENTRESTO) 49-51 MG Take 1 tablet by mouth 2 (two) times daily. 180 tablet 3  ? spironolactone (ALDACTONE) 25 MG tablet Take 0.5 tablets (12.5 mg total) by mouth daily. 15 tablet 6  ? Melatonin 5 MG TABS Take 5 mg by mouth as needed.  (Patient not taking: Reported on 12/10/2021)    ? ?No current facility-administered medications for this visit.  ? ? ? ?Past Medical History:  ?Diagnosis Date  ? CAD (coronary artery disease)   ? Anterior MI with emergent PCI followed by emergent CABG x 3 in April of 2007 per Dr. Dorris [name] with SVG to LAD and SVG to ramus & OM1  ? Chronic systolic heart failure (HCC)   ? Hyperlipidemia   ? ICD (implantable cardioverter-defibrillator) in place   ? MI (myocardial infarction) (HCC) 11/2005  ? ? ?ROS: ? ? All systems reviewed and negative except as noted in the HPI. ? ? ?Past Surgical History:  ?Procedure Laterality Date  ? CARDIAC CATHETERIZATION  2004, 2007  ? CARDIAC DEFIBRILLATOR PLACEMENT  04/2006  ? [employer] [facility]-Dr. Amil [name]  ?  CAROTID STENT INSERTION    ? CHOLECYSTECTOMY    ? CORONARY ARTERY BYPASS GRAFT  11/2005  ? EP IMPLANTABLE DEVICE N/A 09/06/2015  ? Procedure: ICD Generator Changeout;  Surgeon: [name] Maw, MD;  Location: [facility] INVASIVE CV LAB;  Service: Cardiovascular;  Laterality: N/A;  ? VASECTOMY    ? ? ? ?Family History  ?Problem Relation Age of Onset  ? Heart attack Father   ? ? ? ?Social History  ? ?Socioeconomic History  ? Marital status: Married  ?  Spouse name: Not on file  ? Number of children: Not on file  ? Years of education: Not on file  ? Highest education level: Not on file  ?Occupational History  ? Not on file  ?Tobacco Use  ? Smoking status: Never  ? Smokeless tobacco: Never  ?Vaping Use  ? Vaping Use: Never used  ?Substance and Sexual Activity  ?  Alcohol use: No  ?  Alcohol/week: 0.0 standard drinks  ? Drug use: No  ? Sexual activity: Not on file  ?Other Topics Concern  ? Not on file  ?Social History Narrative  ? Not on file  ? ?Social Determinants of Health  ? ?Financial Resource Strain: Not on file  ?Food Insecurity: Not on file  ?Transportation Needs: Not on file  ?Physical Activity: Not on file  ?Stress: Not on file  ?Social Connections: Not on file  ?Intimate Partner Violence: Not on file  ? ? ? ?BP 100/70   Pulse 63   Ht 6' (1.829 m)   Wt 219 lb (99.3 kg)   SpO2 96%   BMI 29.70 kg/m?  ? ?Physical Exam: ? ?Well appearing NAD ?HEENT: Unremarkable ?Neck:  No JVD, no thyromegally ?Lymphatics:  No adenopathy ?Back:  No CVA tenderness ?Lungs:  Clear with no wheezes ?HEART:  Regular rate rhythm, no murmurs, no rubs, no clicks ?Abd:  soft, positive bowel sounds, no organomegally, no rebound, no guarding ?Ext:  2 plus pulses, no edema, no cyanosis, no clubbing ?Skin:  No rashes no nodules ?Neuro:  CN II through XII intact, motor grossly intact ? ?EKG - nsr with RBBB and left axis ? ?DEVICE  ?Normal device function.  See PaceArt for details.  ? ?Assess/Plan:  ?1. Chronic systolic heart failure - his symptoms are class 2. He will continue his current meds. ?2. VT - he has had no recurrent ICD therapy. He will continue his current meds. He has not required AA drug therapy ?3. PAF - he has been in NSR 100 % of the time. He will continue his current meds.  ?4. ICD - his single chamber ICD is working normally. We will see him back in a year. ?  ?[name] Gowda Shrihaan Porzio,MD ?

## 2021-12-10 NOTE — Patient Instructions (Signed)
Medication Instructions:  ?Your physician recommends that you continue on your current medications as directed. Please refer to the Current Medication list given to you today. ? ?Labwork: ?None ordered. ? ?Testing/Procedures: ?None ordered. ? ?Follow-Up: ?Your physician wants you to follow-up in: one year with Gregg Taylor, MD or one of the following Advanced Practice Providers on your designated Care Team:   ?Renee Ursuy, PA-C ?Michael "Andy" Tillery, PA-C ? ?Remote monitoring is used to monitor your ICD from home. This monitoring reduces the number of office visits required to check your device to one time per year. It allows us to keep an eye on the functioning of your device to ensure it is working properly. You are scheduled for a device check from home on 03/11/2022. You may send your transmission at any time that day. If you have a wireless device, the transmission will be sent automatically. After your physician reviews your transmission, you will receive a postcard with your next transmission date. ? ?Any Other Special Instructions Will Be Listed Below (If Applicable). ? ?If you need a refill on your cardiac medications before your next appointment, please call your pharmacy.  ? ?Important Information About Sugar ? ? ? ? ? ? ? ?

## 2021-12-24 ENCOUNTER — Other Ambulatory Visit: Payer: Self-pay | Admitting: Interventional Cardiology

## 2021-12-30 NOTE — Progress Notes (Signed)
?Cardiology Office Note:   ? ?Date:  12/31/2021  ? ?ID:  [name] [name], DOB 08/06/1961, MRN [id number] ? ?PCP:  [name], MD  ?Cardiologist:  Lesleigh Noe, MD  ? ?Referring MD: [name], MD  ? ?Chief Complaint  ?Patient presents with  ? Coronary Artery Disease  ? Congestive Heart Failure  ? ? ?History of Present Illness:   ? ?[name] [name] is a 61 y.o. male with a hx of ostial LAD Taxus DES stent, subsequent acute stent thrombosis 2007, emergency coronary bypass grafting with saphenous vein grafts 2007, chronic systolic heart failure with EF 25-30% January 2016, hyperlipidemia, and AICD.  Conversion to Entresto fall 2019. ? ? ?Has been seen by Dr. [name] after primary prevention AICD implantation years ago. ? ?Saw Dr. Gala Romney in 2021.  A CPX was performed and he "did better than I thought he would" according to Dr. Gala Romney.  No follow-up has been scheduled. ? ?The patient's major limitations are due to orthopedic issues involving his knees, hips, and feet.  He also has low back discomfort. ? ?He denies orthopnea, PND, angina, dyspnea on exertion, palpitations, and syncope.  There is no peripheral edema.  There are no medication side effects. ? ?We discussed quadruple therapy.  Against adding SGLT2 at this time because of relatively low blood pressures. ? ?Past Medical History:  ?Diagnosis Date  ? CAD (coronary artery disease)   ? Anterior MI with emergent PCI followed by emergent CABG x 3 in April of 2007 per Dr. Dorris Fetch with SVG to LAD and SVG to ramus & OM1  ? Chronic systolic heart failure (HCC)   ? Hyperlipidemia   ? ICD (implantable cardioverter-defibrillator) in place   ? MI (myocardial infarction) (HCC) 11/2005  ? ? ?Past Surgical History:  ?Procedure Laterality Date  ? CARDIAC CATHETERIZATION  2004, 2007  ? CARDIAC DEFIBRILLATOR PLACEMENT  04/2006  ? [employer]-Dr. Amil Amen  ? CAROTID STENT INSERTION    ? CHOLECYSTECTOMY    ? CORONARY ARTERY BYPASS GRAFT  11/2005  ? EP  IMPLANTABLE DEVICE N/A 09/06/2015  ? Procedure: ICD Generator Changeout;  Surgeon: Marinus Maw, MD;  [facility] IN[facility]IVE CV [facility];  Service: Cardiovascular;  Laterality: N/A;  ? VASECTOMY    ? ? ?Current Medications: ?Current Meds  ?Medication Sig  ? apixaban (ELIQUIS) 5 MG TABS tablet Take 1 tablet (5 mg total) by mouth 2 (two) times daily.  ? aspirin 81 MG EC tablet Take 1 tablet (81 mg total) by mouth daily.  ? carvedilol (COREG) 12.5 MG tablet TAKE 1 TABLET BY MOUTH TWICE A DAY WITH MEALS  ? CRESTOR 40 MG tablet Take 20 mg by mouth daily.  ? diphenoxylate-atropine (LOMOTIL) 2.5-0.025 MG per tablet Take 1 tablet by mouth 4 (four) times daily as needed for diarrhea or loose stools.   ? fish oil-omega-3 fatty acids 1000 MG capsule Take 1 g by mouth daily.   ? levothyroxine (SYNTHROID, LEVOTHROID) 25 MCG tablet Take 25 mcg by mouth daily.  ? mirtazapine (REMERON) 30 MG tablet Take 30 mg by mouth at bedtime.  ? nitroGLYCERIN (NITROSTAT) 0.4 MG SL tablet PLACE 1 TABLET UNDER THE TONGUE EVERY 5 MINUTES AS NEEDED FOR CHEST PAIN  ? promethazine (PHENERGAN) 25 MG tablet Take 25 mg by mouth every 6 (six) hours as needed for nausea or vomiting.  ? sacubitril-valsartan (ENTRESTO) 49-51 MG Take 1 tablet by mouth 2 (two) times daily.  ? spironolactone (ALDACTONE) 25 MG tablet Take 0.5 tablets (12.5  mg total) by mouth daily.  ?  ? ?Allergies:   Adhesive [tape], Bee venom, Eggs or egg-derived products, Penicillins, and Ace inhibitors  ? ?Social History  ? ?Socioeconomic History  ? Marital status: Married  ?  Spouse name: Not on file  ? Number of children: Not on file  ? Years of education: Not on file  ? Highest education level: Not on file  ?Occupational History  ? Not on file  ?Tobacco Use  ? Smoking status: Never  ? Smokeless tobacco: Never  ?Vaping Use  ? Vaping Use: Never used  ?Substance and Sexual Activity  ? Alcohol use: No  ?  Alcohol/week: 0.0 standard drinks  ? Drug use: No  ? Sexual activity: Not on file  ?Other  Topics Concern  ? Not on file  ?Social History Narrative  ? Not on file  ? ?Social Determinants of Health  ? ?Financial Resource Strain: Not on file  ?Food Insecurity: Not on file  ?Transportation Needs: Not on file  ?Physical Activity: Not on file  ?Stress: Not on file  ?Social Connections: Not on file  ?  ? ?Family History: ?The patient's family history includes Heart attack in his father. ? ?ROS:   ?Please see the history of present illness.    ?Foot, ankle, knee, give some limitations.  His back gives significant limitation to physical activity.  All other systems reviewed and are negative. ? ?EKGs/Labs/Other Studies Reviewed:   ? ?The following studies were reviewed today: ?No repeat imaging. ? ?2021 CPX study was reviewed. ? ?EKG:  EKG performed on 12/10/2021, sinus rhythm, right bundle branch block, and first-degree AV block. ? ?Recent Labs: ?No results found for requested labs within last 8760 hours.  ?Recent Lipid Panel ?No results found for: CHOL, TRIG, HDL, CHOLHDL, VLDL, LDLCALC, LDLDIRECT ? ?Physical Exam:   ? ?VS:  BP 102/72   Pulse 69   Ht 6' (1.829 m)   Wt 219 lb 12.8 oz (99.7 kg)   SpO2 97%   BMI 29.81 kg/m?    ? ?Wt Readings from Last 3 Encounters:  ?12/31/21 219 lb 12.8 oz (99.7 kg)  ?12/10/21 219 lb (99.3 kg)  ?12/07/20 221 lb (100.2 kg)  ?  ? ?GEN: Overweight but down compared to 1 month ago.. No acute distress ?HEENT: Normal ?NECK: No JVD. ?LYMPHATICS: No lymphadenopathy ?CARDIAC: No murmur. RRR S4 gallop, but no edema. ?VASCULAR:  Normal Pulses. No bruits. ?RESPIRATORY:  Clear to auscultation without rales, wheezing or rhonchi  ?ABDOMEN: Soft, non-tender, non-distended, No pulsatile mass, ?MUSCULOSKELETAL: No deformity  ?SKIN: Warm and dry ?NEUROLOGIC:  Alert and oriented x 3 ?PSYCHIATRIC:  Normal affect  ? ?ASSESSMENT:   ? ?1. Chronic systolic heart failure (HCC)   ?2. CAD in native artery   ?3. Ischemic cardiomyopathy   ?4. S/P ICD (internal cardiac defibrillator) procedure   ?5.  Paroxysmal atrial fibrillation (HCC)   ?6. Essential hypertension   ?7. Other hyperlipidemia   ?8. Chronic anticoagulation   ? ?PLAN:   ? ?In order of problems listed above: ? ?We discussed quadruple therapy.  Because of relatively low blood pressures and orthostatic symptoms, we decided not to add SGLT2 at this time.  Continue Entresto, spironolactone, and carvedilol. ?Preventative therapy reviewed.  Encouraged 150 minutes of moderate activity. ?See #1 above ?Recently saw Dr. Ladona Ridgelaylor.  Brief episode of atrial fibrillation with rapid rate that did not incite treatment from his device. ?Low burden. ?She is relatively low and thus the reason SGLT2 is not  started. ?Continue high intensity statin therapy.  LDL target 55 or less continue  ?Eliquis 5 mg twice daily because of known atrial fibrillation. ? ? ?Overall education and awareness concerning secondary risk prevention was discussed in detail: LDL less than 70, hemoglobin A1c less than 7, blood pressure target less than 130/80 mmHg, >150 minutes of moderate aerobic activity per week, avoidance of smoking, weight control (via diet and exercise), and continued surveillance/management of/for obstructive sleep apnea. ? ? ? ? ?Medication Adjustments/Labs and Tests Ordered: ?Current medicines are reviewed at length with the patient today.  Concerns regarding medicines are outlined above.  ?No orders of the defined types were placed in this encounter. ? ?No orders of the defined types were placed in this encounter. ? ? ?Patient Instructions  ?Medication Instructions:  ?Your physician recommends that you continue on your current medications as directed. Please refer to the Current Medication list given to you today. ? ?*If you need a refill on your cardiac medications before your next appointment, please call your pharmacy* ? ?Lab Work: ?NONE ? ?Testing/Procedures: ?NONE ? ?Follow-Up: ?At [facility], you and your health needs are our priority.  As part of our continuing  mission to provide you with exceptional heart care, we have created designated Provider Care Teams.  These Care Teams include your primary Cardiologist (physician) and Advanced Practice Providers (APPs -  Physician As

## 2021-12-31 ENCOUNTER — Ambulatory Visit: Payer: BC Managed Care – PPO | Admitting: Interventional Cardiology

## 2021-12-31 ENCOUNTER — Encounter: Payer: Self-pay | Admitting: Interventional Cardiology

## 2021-12-31 VITALS — BP 102/72 | HR 69 | Ht 72.0 in | Wt 219.8 lb

## 2021-12-31 DIAGNOSIS — I10 Essential (primary) hypertension: Secondary | ICD-10-CM

## 2021-12-31 DIAGNOSIS — I255 Ischemic cardiomyopathy: Secondary | ICD-10-CM

## 2021-12-31 DIAGNOSIS — I2510 Atherosclerotic heart disease of native coronary artery without angina pectoris: Secondary | ICD-10-CM

## 2021-12-31 DIAGNOSIS — I5022 Chronic systolic (congestive) heart failure: Secondary | ICD-10-CM | POA: Diagnosis not present

## 2021-12-31 DIAGNOSIS — Z95810 Presence of automatic (implantable) cardiac defibrillator: Secondary | ICD-10-CM

## 2021-12-31 DIAGNOSIS — E7849 Other hyperlipidemia: Secondary | ICD-10-CM

## 2021-12-31 DIAGNOSIS — I480 Paroxysmal atrial fibrillation: Secondary | ICD-10-CM

## 2021-12-31 DIAGNOSIS — Z7901 Long term (current) use of anticoagulants: Secondary | ICD-10-CM

## 2021-12-31 NOTE — Patient Instructions (Signed)
Medication Instructions:  Your physician recommends that you continue on your current medications as directed. Please refer to the Current Medication list given to you today.  *If you need a refill on your cardiac medications before your next appointment, please call your pharmacy*  Lab Work: NONE  Testing/Procedures: NONE  Follow-Up: At [facility], you and your health needs are our priority.  As part of our continuing mission to provide you with exceptional heart care, we have created designated Provider Care Teams.  These Care Teams include your primary Cardiologist (physician) and Advanced Practice Providers (APPs -  Physician Assistants and Nurse Practitioners) who all work together to provide you with the care you need, when you need it.  Your next appointment:   9 month(s)  The format for your next appointment:   In Person  Provider:   Henry W Smith III, MD {   Important Information About Sugar       

## 2022-01-03 ENCOUNTER — Other Ambulatory Visit: Payer: Self-pay | Admitting: Interventional Cardiology

## 2022-01-15 ENCOUNTER — Other Ambulatory Visit: Payer: Self-pay | Admitting: Gastroenterology

## 2022-01-15 DIAGNOSIS — Z8679 Personal history of other diseases of the circulatory system: Secondary | ICD-10-CM | POA: Diagnosis not present

## 2022-01-15 DIAGNOSIS — Z8371 Family history of colonic polyps: Secondary | ICD-10-CM | POA: Diagnosis not present

## 2022-01-15 DIAGNOSIS — I255 Ischemic cardiomyopathy: Secondary | ICD-10-CM | POA: Diagnosis not present

## 2022-01-15 DIAGNOSIS — K9089 Other intestinal malabsorption: Secondary | ICD-10-CM | POA: Diagnosis not present

## 2022-01-16 ENCOUNTER — Telehealth: Payer: Self-pay

## 2022-01-16 NOTE — Telephone Encounter (Signed)
   Pre-operative Risk Assessment    Patient Name: [name] [name]  DOB: [birth year] MRN: [id number]     Request for Surgical Clearance    Procedure:  Colonoscopy   Date of Surgery:  Clearance 05/14/22                                 Surgeon:  Dr. Levora Angel Surgeon's Group or Practice Name:  [facility]  Phone number:  [phone] Fax number:  [phone]   Type of Clearance Requested:   - Pharmacy:  Hold Apixaban (Eliquis) 2 days prior    Type of Anesthesia:  Propofol  {  Signed, Vernard Gambles   01/16/2022, 5:20 PM

## 2022-01-17 NOTE — Telephone Encounter (Signed)
   Primary Cardiologist: Lesleigh Noe, MD  Chart reviewed as part of pre-operative protocol coverage. Given past medical history and time since last visit, based on ACC/AHA guidelines, [name] [name] would be at acceptable risk for the planned procedure without further cardiovascular testing.   Patient with diagnosis of afib on Eliquis for anticoagulation.     Procedure: colonoscopy Date of procedure: 05/14/22   CHA2DS2-VASc Score = 2  This indicates a 2.2% annual risk of stroke. The patient's score is based upon: CHF History: 1 HTN History: 0 Diabetes History: 0 Stroke History: 0 Vascular Disease History: 1 Age Score: 0 Gender Score: 0   CrCl 86mL/min Platelet count 175K   Per office protocol, patient can hold Eliquis for 2 days prior to procedure as requested.   I will route this recommendation to the requesting party via Epic fax function and remove from pre-op pool.  Please call with questions.  Thomasene Ripple. Vasiliki Smaldone NP-C    01/17/2022, 10:48 AM [facility] [address] Office [phone] Fax [phone]

## 2022-01-17 NOTE — Telephone Encounter (Signed)
Patient with diagnosis of afib on Eliquis for anticoagulation.    Procedure: colonoscopy Date of procedure: 05/14/22  CHA2DS2-VASc Score = 2  This indicates a 2.2% annual risk of stroke. The patient's score is based upon: CHF History: 1 HTN History: 0 Diabetes History: 0 Stroke History: 0 Vascular Disease History: 1 Age Score: 0 Gender Score: 0   CrCl 72mL/min Platelet count 175K  Per office protocol, patient can hold Eliquis for 2 days prior to procedure as requested.

## 2022-02-07 ENCOUNTER — Other Ambulatory Visit: Payer: Self-pay | Admitting: Interventional Cardiology

## 2022-02-07 DIAGNOSIS — I480 Paroxysmal atrial fibrillation: Secondary | ICD-10-CM

## 2022-02-07 NOTE — Telephone Encounter (Signed)
Prescription refill request for Eliquis received. Indication: Afib  Last office visit: 12/31/21 Katrinka Blazing)  Scr: 1.29 (05/14/21 via [facility])  Age: 61 Weight: 99.7kg  Appropriate dose and refill sent to requested pharmacy.

## 2022-02-18 DIAGNOSIS — M5451 Vertebrogenic low back pain: Secondary | ICD-10-CM | POA: Diagnosis not present

## 2022-03-11 ENCOUNTER — Ambulatory Visit (INDEPENDENT_AMBULATORY_CARE_PROVIDER_SITE_OTHER): Payer: BC Managed Care – PPO

## 2022-03-11 DIAGNOSIS — I5022 Chronic systolic (congestive) heart failure: Secondary | ICD-10-CM | POA: Diagnosis not present

## 2022-03-11 LAB — CUP PACEART REMOTE DEVICE CHECK
Battery Remaining Longevity: 66 mo
Battery Voltage: 2.99 V
Brady Statistic RV Percent Paced: 2.02 %
Date Time Interrogation Session: 20230717052506
HighPow Impedance: 40 Ohm
HighPow Impedance: 51 Ohm
Implantable Lead Implant Date: 20070921
Implantable Lead Location: 753860
Implantable Lead Model: 6947
Implantable Pulse Generator Implant Date: 20170111
Lead Channel Impedance Value: 418 Ohm
Lead Channel Impedance Value: 475 Ohm
Lead Channel Pacing Threshold Amplitude: 0.5 V
Lead Channel Pacing Threshold Pulse Width: 0.4 ms
Lead Channel Sensing Intrinsic Amplitude: 20.75 mV
Lead Channel Sensing Intrinsic Amplitude: 20.75 mV
Lead Channel Setting Pacing Amplitude: 2.5 V
Lead Channel Setting Pacing Pulse Width: 0.4 ms
Lead Channel Setting Sensing Sensitivity: 0.3 mV

## 2022-03-14 ENCOUNTER — Other Ambulatory Visit (HOSPITAL_COMMUNITY): Payer: Self-pay | Admitting: Internal Medicine

## 2022-04-16 NOTE — Progress Notes (Signed)
Remote ICD transmission.   

## 2022-05-14 ENCOUNTER — Ambulatory Visit (HOSPITAL_COMMUNITY): Admit: 2022-05-14 | Payer: BC Managed Care – PPO | Admitting: Gastroenterology

## 2022-05-14 ENCOUNTER — Encounter (HOSPITAL_COMMUNITY): Payer: Self-pay

## 2022-05-14 SURGERY — COLONOSCOPY WITH PROPOFOL
Anesthesia: Monitor Anesthesia Care

## 2022-05-27 DIAGNOSIS — E7800 Pure hypercholesterolemia, unspecified: Secondary | ICD-10-CM | POA: Diagnosis not present

## 2022-05-27 DIAGNOSIS — I10 Essential (primary) hypertension: Secondary | ICD-10-CM | POA: Diagnosis not present

## 2022-05-27 DIAGNOSIS — L820 Inflamed seborrheic keratosis: Secondary | ICD-10-CM | POA: Diagnosis not present

## 2022-05-27 DIAGNOSIS — Z0000 Encounter for general adult medical examination without abnormal findings: Secondary | ICD-10-CM | POA: Diagnosis not present

## 2022-05-27 DIAGNOSIS — Z125 Encounter for screening for malignant neoplasm of prostate: Secondary | ICD-10-CM | POA: Diagnosis not present

## 2022-06-10 ENCOUNTER — Ambulatory Visit (INDEPENDENT_AMBULATORY_CARE_PROVIDER_SITE_OTHER): Payer: BC Managed Care – PPO

## 2022-06-10 DIAGNOSIS — I255 Ischemic cardiomyopathy: Secondary | ICD-10-CM

## 2022-06-10 DIAGNOSIS — I5022 Chronic systolic (congestive) heart failure: Secondary | ICD-10-CM

## 2022-06-10 LAB — CUP PACEART REMOTE DEVICE CHECK
Battery Remaining Longevity: 62 mo
Battery Voltage: 3 V
Brady Statistic RV Percent Paced: 2.63 %
Date Time Interrogation Session: 20231016012305
HighPow Impedance: 42 Ohm
HighPow Impedance: 55 Ohm
Implantable Lead Implant Date: 20070921
Implantable Lead Location: 753860
Implantable Lead Model: 6947
Implantable Pulse Generator Implant Date: 20170111
Lead Channel Impedance Value: 399 Ohm
Lead Channel Impedance Value: 513 Ohm
Lead Channel Pacing Threshold Amplitude: 0.5 V
Lead Channel Pacing Threshold Pulse Width: 0.4 ms
Lead Channel Sensing Intrinsic Amplitude: 21.5 mV
Lead Channel Sensing Intrinsic Amplitude: 21.5 mV
Lead Channel Setting Pacing Amplitude: 2.5 V
Lead Channel Setting Pacing Pulse Width: 0.4 ms
Lead Channel Setting Sensing Sensitivity: 0.3 mV

## 2022-06-27 NOTE — Progress Notes (Signed)
Remote ICD transmission.   

## 2022-06-28 ENCOUNTER — Other Ambulatory Visit: Payer: Self-pay | Admitting: Internal Medicine

## 2022-07-04 ENCOUNTER — Telehealth: Payer: Self-pay

## 2022-07-04 NOTE — Patient Outreach (Signed)
  Care Coordination   07/04/2022 Name: [name] [name] MRN: [id number] DOB: 1960/11/05   Care Coordination Outreach Attempts:  An unsuccessful telephone outreach was attempted today to offer the patient information about available care coordination services as a benefit of their health plan.   Follow Up Plan:  Additional outreach attempts will be made to offer the patient care coordination information and services.   Encounter Outcome:  No Answer  Care Coordination Interventions Activated:  No   Care Coordination Interventions:  No, not indicated    Bary Leriche, RN, MSN [facility] Care Management Care Management Coordinator Direct Line [phone]

## 2022-07-04 NOTE — Patient Instructions (Signed)
Visit Information  Thank you for taking time to visit with me today. Please don't hesitate to contact me if I can be of assistance to you.   Following are the goals we discussed today:   Goals Addressed             This Visit's Progress    COMPLETED: Care Coordination Activities-No follow up required       Care Coordination Interventions: Advised patient to complete annual physical. Completed 05-27-22.  Discussed [facility] services and support.  Patient declined           If you are experiencing a Mental Health or Behavioral Health Crisis or need someone to talk to, please call the Suicide and Crisis Lifeline: 988   Patient verbalizes understanding of instructions and care plan provided today and agrees to view in MyChart. Active MyChart status and patient understanding of how to access instructions and care plan via MyChart confirmed with patient.     No further follow up required: decline  Bary Leriche, RN, MSN [facility] Care Management Care Management Coordinator Direct Line [phone]

## 2022-07-04 NOTE — Patient Outreach (Signed)
  Care Coordination   Initial Visit Note   07/04/2022 Name: [name] [name] MRN: [id number] DOB: 10/25/1960  [name] [name] is a 61 y.o. year old male who sees [name] Blamer, MD for primary care. I spoke with  [name] [name] by phone today.  What matters to the patients health and wellness today?  none    Goals Addressed             This Visit's Progress    COMPLETED: Care Coordination Activities-No follow up required       Care Coordination Interventions: Advised patient to complete annual physical. Completed 05-27-22.  Discussed [facility] services and support.  Patient declined          SDOH assessments and interventions completed:  Yes  SDOH Interventions Today    Flowsheet Row Most Recent Value  SDOH Interventions   Housing Interventions Intervention Not Indicated  Transportation Interventions Intervention Not Indicated        Care Coordination Interventions Activated:  Yes  Care Coordination Interventions:  Yes, provided   Follow up plan: No further intervention required.   Encounter Outcome:  Pt. Visit Completed    Bary Leriche, RN, MSN [facility] Care Management Care Management Coordinator Direct Line [phone]

## 2022-07-15 ENCOUNTER — Other Ambulatory Visit: Payer: Self-pay | Admitting: Interventional Cardiology

## 2022-07-15 DIAGNOSIS — I480 Paroxysmal atrial fibrillation: Secondary | ICD-10-CM

## 2022-07-16 NOTE — Telephone Encounter (Signed)
Prescription refill request for Eliquis received. Indication:afib Last office visit:5/23 Scr:1.2 Age: 61 Weight:99.7  kg  Prescription refilled

## 2022-07-29 NOTE — Progress Notes (Unsigned)
Cardiology Office Note:    Date:  07/30/2022   ID:  [name], [name] 1961-07-01, MRN [id number]  PCP:  [name], MD  Cardiologist:  Lesleigh Noe, MD   Referring MD: [name], MD   Chief Complaint  Patient presents with   Congestive Heart Failure    History of Present Illness:    [name] [name] is a 61 y.o. male with a hx of ostial LAD Taxus DES stent, subsequent acute stent thrombosis 2007, emergency coronary bypass grafting with saphenous vein grafts 2007, chronic systolic heart failure with EF <20 % 10/2019,  hyperlipidemia, and AICD.  Conversion to Entresto fall 2019.   On Entresto, Carvedilol, Spironolactone...  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  He did not feel bad but he noticed that the heart rate was high.  No angina.  No AICD discharge.  Overall has felt well.  He just wanted me to know about this because it seemed unusual.  Past Medical History:  Diagnosis Date   CAD (coronary artery disease)    Anterior MI with emergent PCI followed by emergent CABG x 3 in April of 2007 per Dr. Dorris Fetch with SVG to LAD and SVG to ramus & OM1   Chronic systolic heart failure (HCC)    Hyperlipidemia    ICD (implantable cardioverter-defibrillator) in place    MI (myocardial infarction) (HCC) 11/2005    Past Surgical History:  Procedure Laterality Date   CARDIAC CATHETERIZATION  2004, 2007   CARDIAC DEFIBRILLATOR PLACEMENT  04/2006   [employer]-Dr. Amil Amen   CAROTID STENT INSERTION     CHOLECYSTECTOMY     CORONARY ARTERY BYPASS GRAFT  11/2005   EP IMPLANTABLE DEVICE N/A 09/06/2015   Procedure: ICD Generator Changeout;  Surgeon: Marinus Maw, MD;  [facility] INVASIVE CV LAB;  Service: Cardiovascular;  Laterality: N/A;   VASECTOMY      Current Medications: Current Meds  Medication Sig   aspirin 81 MG EC tablet Take 1  tablet (81 mg total) by mouth daily.   carvedilol (COREG) 12.5 MG tablet TAKE 1 TABLET BY MOUTH TWICE A DAY WITH MEALS   colestipol (COLESTID) 1 g tablet Take 2 g by mouth daily.   CRESTOR 40 MG tablet Take 20 mg by mouth daily.   dapagliflozin propanediol (FARXIGA) 10 MG TABS tablet Take 1 tablet (10 mg total) by mouth daily before breakfast.   diphenoxylate-atropine (LOMOTIL) 2.5-0.025 MG per tablet Take 1 tablet by mouth 4 (four) times daily as needed for diarrhea or loose stools.    ELIQUIS 5 MG TABS tablet TAKE 1 TABLET BY MOUTH TWICE A DAY   ENTRESTO 49-51 MG TAKE 1 TABLET BY MOUTH TWICE A DAY   fish oil-omega-3 fatty acids 1000 MG capsule Take 1 g by mouth daily.    levothyroxine (SYNTHROID, LEVOTHROID) 25 MCG tablet Take 25 mcg by mouth daily.   mirtazapine (REMERON) 30 MG tablet Take 30 mg by mouth at bedtime.   nitroGLYCERIN (NITROSTAT) 0.4 MG SL tablet PLACE 1 TABLET UNDER THE TONGUE EVERY 5 MINUTES AS NEEDED FOR CHEST PAIN.   promethazine (PHENERGAN) 25 MG tablet Take 25 mg by mouth every 6 (six) hours as needed for nausea or vomiting.   spironolactone (ALDACTONE) 25 MG tablet Take 0.5 tablets (12.5 mg total) by mouth daily. NEEDS FOLLOW UP APPOINTMENT FOR ANYMORE REFILLS  Allergies:   Adhesive [tape], Bee venom, Eggs or egg-derived products, Penicillins, and Ace inhibitors   Social History   Socioeconomic History   Marital status: Married    Spouse name: Not on file   Number of children: Not on file   Years of education: Not on file   Highest education level: Not on file  Occupational History   Not on file  Tobacco Use   Smoking status: Never   Smokeless tobacco: Never  Vaping Use   Vaping Use: Never used  Substance and Sexual Activity   Alcohol use: No    Alcohol/week: 0.0 standard drinks of alcohol   Drug use: No   Sexual activity: Not on file  Other Topics Concern   Not on file  Social History Narrative   Not on file   Social Determinants of Health    Financial Resource Strain: Not on file  Food Insecurity: Not on file  Transportation Needs: No Transportation Needs (07/04/2022)   PRAPARE - Administrator, Civil Service (Medical): No    Lack of Transportation (Non-Medical): No  Physical Activity: Not on file  Stress: Not on file  Social Connections: Not on file     Family History: The patient's family history includes Heart attack in his father.  ROS:   Please see the history of present illness.    No data otherwise all other systems reviewed and are negative.  EKGs/Labs/Other Studies Reviewed:    The following studies were reviewed today: CP Exercise Test 02/2020 Normal functional capacity. At peak exercise patient is limited by his body habitus and restrictive lung physiology. The elevated VEVCo2 pulse suggests increased pulmonary pressures with exercise.  EKG:  EKG not repeated  Recent Labs: No results found for requested labs within last 365 days.  Recent Lipid Panel No results found for: "CHOL", "TRIG", "HDL", "CHOLHDL", "VLDL", "LDLCALC", "LDLDIRECT"  Physical Exam:    VS:  BP 104/64   Pulse (!) 54   Ht 6' (1.829 m)   Wt 213 lb 9.6 oz (96.9 kg)   SpO2 98%   BMI 28.97 kg/m     Wt Readings from Last 3 Encounters:  07/30/22 213 lb 9.6 oz (96.9 kg)  12/31/21 219 lb 12.8 oz (99.7 kg)  12/10/21 219 lb (99.3 kg)     GEN: Slightly overweight. No acute distress HEENT: Normal NECK: No JVD. LYMPHATICS: No lymphadenopathy CARDIAC: No murmur. RRR S4 but no S3 gallop, or edema. VASCULAR:  Normal Pulses. No bruits. RESPIRATORY:  Clear to auscultation without rales, wheezing or rhonchi  ABDOMEN: Soft, non-tender, non-distended, No pulsatile mass, MUSCULOSKELETAL: No deformity  SKIN: Warm and dry NEUROLOGIC:  Alert and oriented x 3 PSYCHIATRIC:  Normal affect   ASSESSMENT:    1. Chronic systolic heart failure (HCC)   2. CAD in native artery   3. S/P ICD (internal cardiac defibrillator) procedure    4. Paroxysmal atrial fibrillation (HCC)   5. Essential hypertension   6. Other hyperlipidemia   7. Chronic anticoagulation    PLAN:    In order of problems listed above:  Start Farxiga 10 mg/day.  Basic metabolic panel in 2 weeks.  He has been seen by Dr. Gala Romney in the advanced heart failure clinic.  He will need follow-up with Dr. Eldridge Dace in 4-6 months assuming no difficulty with [place]. Dr. Eldridge Dace will be his cardiologist going forward.  Dr. Manuella Ghazi took care of him when he had occlusion of his ostial LAD and was in cardiogenic shock.  Followed in the device clinic by Dr. [name].  He had an episode of high heart rate in the 90-100 range after heavy day of work.  It gradually decreased that evening based upon monitoring by his Apple Watch.  We will see what the next interrogation shows. Blood pressure 120/70 mmHg felt to be adequate to tolerate adding [place].  Warned him to notify us if any urinary tract issues. Continue high intensity statin therapy.  Target LDL less than 70. Continue Eliquis which is being used as prophylaxis against stroke.  He has A-fib burden greater than 3% on telemetry from his ICD.   Guideline directed therapy for left ventricular systolic dysfunction: Angiotensin receptor-neprilysin inhibitor (ARNI)-Entresto; beta-blocker therapy - carvedilol, metoprolol succinate, or bisoprolol; mineralocorticoid receptor antagonist (MRA) therapy -spironolactone or eplerenone.  SGLT-2 agents -  Dapagliflozin Marcelline Deist) or Empagliflozin (Jardiance).These therapies have been shown to improve clinical outcomes including reduction of rehospitalization, survival, and acute heart failure.     Medication Adjustments/Labs and Tests Ordered: Current medicines are reviewed at length with the patient today.  Concerns regarding medicines are outlined above.  Orders Placed This Encounter  Procedures   Basic metabolic panel   Meds ordered this encounter  Medications    dapagliflozin propanediol (FARXIGA) 10 MG TABS tablet    Sig: Take 1 tablet (10 mg total) by mouth daily before breakfast.    Dispense:  30 tablet    Refill:  11    Patient Instructions  Medication Instructions:  Your physician has recommended you make the following change in your medication:   1) START dapagliflozin (Farxiga) 10mg  daily with breakfast  *If you need a refill on your cardiac medications before your next appointment, please call your pharmacy*   Lab Work: In 2-3 weeks: BMET If you have labs (blood work) drawn today and your tests are completely normal, you will receive your results only by: MyChart Message (if you have MyChart) OR A paper copy in the mail If you have any lab test that is abnormal or we need to change your treatment, we will call you to review the results.  Follow-Up: At [facility], you and your health needs are our priority.  As part of our continuing mission to provide you with exceptional heart care, we have created designated Provider Care Teams.  These Care Teams include your primary Cardiologist (physician) and Advanced Practice Providers (APPs -  Physician Assistants and Nurse Practitioners) who all work together to provide you with the care you need, when you need it.  Your next appointment:   4-6 month(s)  The format for your next appointment:   In Person  Provider:   [facility], MD  Important Information About Sugar         Signed, Lance Muss, MD  07/30/2022 3:52 PM    [facility]

## 2022-07-30 ENCOUNTER — Encounter: Payer: Self-pay | Admitting: Interventional Cardiology

## 2022-07-30 ENCOUNTER — Ambulatory Visit: Payer: BC Managed Care – PPO | Attending: Interventional Cardiology | Admitting: Interventional Cardiology

## 2022-07-30 VITALS — BP 104/64 | HR 54 | Ht 72.0 in | Wt 213.6 lb

## 2022-07-30 DIAGNOSIS — Z95810 Presence of automatic (implantable) cardiac defibrillator: Secondary | ICD-10-CM

## 2022-07-30 DIAGNOSIS — I2510 Atherosclerotic heart disease of native coronary artery without angina pectoris: Secondary | ICD-10-CM | POA: Diagnosis not present

## 2022-07-30 DIAGNOSIS — Z7901 Long term (current) use of anticoagulants: Secondary | ICD-10-CM

## 2022-07-30 DIAGNOSIS — I5022 Chronic systolic (congestive) heart failure: Secondary | ICD-10-CM | POA: Diagnosis not present

## 2022-07-30 DIAGNOSIS — E7849 Other hyperlipidemia: Secondary | ICD-10-CM

## 2022-07-30 DIAGNOSIS — I10 Essential (primary) hypertension: Secondary | ICD-10-CM

## 2022-07-30 DIAGNOSIS — I480 Paroxysmal atrial fibrillation: Secondary | ICD-10-CM | POA: Diagnosis not present

## 2022-07-30 MED ORDER — DAPAGLIFLOZIN PROPANEDIOL 10 MG PO TABS: 10.0000 mg | ORAL_TABLET | Freq: Every day | ORAL | 11 refills | Status: DC

## 2022-07-30 NOTE — Patient Instructions (Signed)
Medication Instructions:  Your physician has recommended you make the following change in your medication:   1) START dapagliflozin (Farxiga) 10mg  daily with breakfast  *If you need a refill on your cardiac medications before your next appointment, please call your pharmacy*   Lab Work: In 2-3 weeks: BMET If you have labs (blood work) drawn today and your tests are completely normal, you will receive your results only by: MyChart Message (if you have MyChart) OR A paper copy in the mail If you have any lab test that is abnormal or we need to change your treatment, we will call you to review the results.  Follow-Up: At [facility], you and your health needs are our priority.  As part of our continuing mission to provide you with exceptional heart care, we have created designated Provider Care Teams.  These Care Teams include your primary Cardiologist (physician) and Advanced Practice Providers (APPs -  Physician Assistants and Nurse Practitioners) who all work together to provide you with the care you need, when you need it.  Your next appointment:   4-6 month(s)  The format for your next appointment:   In Person  Provider:   [facility], MD  Important Information About Sugar

## 2022-08-02 ENCOUNTER — Encounter: Payer: Self-pay | Admitting: Interventional Cardiology

## 2022-08-07 DIAGNOSIS — R972 Elevated prostate specific antigen [PSA]: Secondary | ICD-10-CM | POA: Diagnosis not present

## 2022-08-07 DIAGNOSIS — Z23 Encounter for immunization: Secondary | ICD-10-CM | POA: Diagnosis not present

## 2022-08-23 ENCOUNTER — Ambulatory Visit: Payer: BC Managed Care – PPO | Attending: Interventional Cardiology

## 2022-08-23 DIAGNOSIS — I10 Essential (primary) hypertension: Secondary | ICD-10-CM

## 2022-08-23 DIAGNOSIS — I5022 Chronic systolic (congestive) heart failure: Secondary | ICD-10-CM

## 2022-08-24 LAB — BASIC METABOLIC PANEL
BUN/Creatinine Ratio: 13 (ref 10–24)
BUN: 15 mg/dL (ref 8–27)
CO2: 20 mmol/L (ref 20–29)
Calcium: 9.3 mg/dL (ref 8.6–10.2)
Chloride: 103 mmol/L (ref 96–106)
Creatinine, Ser: 1.18 mg/dL (ref 0.76–1.27)
Sodium: 142 mmol/L (ref 134–144)
eGFR: 69 mL/min/{1.73_m2} (ref >59)

## 2022-09-09 ENCOUNTER — Telehealth: Payer: Self-pay | Admitting: Nurse Practitioner

## 2022-09-09 ENCOUNTER — Telehealth: Payer: Self-pay

## 2022-09-09 ENCOUNTER — Ambulatory Visit: Payer: BC Managed Care – PPO

## 2022-09-09 DIAGNOSIS — I5022 Chronic systolic (congestive) heart failure: Secondary | ICD-10-CM

## 2022-09-09 DIAGNOSIS — I255 Ischemic cardiomyopathy: Secondary | ICD-10-CM

## 2022-09-09 NOTE — Telephone Encounter (Signed)
   Patient Name: [name] [name]  DOB: Aug 18, 1961 MRN: [id number]  Primary Cardiologist: Sinclair Grooms, MD  Chart reviewed as part of pre-operative protocol coverage.   Call placed today to discuss patient's upcoming colonoscopy and review guidance for holding medication.  Patient unavailable at this time and detailed message was left with instructions to call back at his earliest convenience.  Mable Fill, Marissa Nestle, NP 09/09/2022, 10:43 AM

## 2022-09-09 NOTE — Telephone Encounter (Signed)
Pharmacy please advise on holding Eliquis prior to colonoscopy scheduled for 10/15/2022. Thank you.

## 2022-09-09 NOTE — Telephone Encounter (Signed)
Patient with diagnosis of atrial fibriliation on Eliquis for anticoagulation.    Procedure:  Colonoscopy  Date of procedure: 10/15/2022   CHA2DS2-VASc Score = 2   This indicates a 2.2% annual risk of stroke. The patient's score is based upon: CHF History: 1 HTN History: 0 Diabetes History: 0 Stroke History: 0 Vascular Disease History: 1 Age Score: 0 Gender Score: 0    CrCl 77 ml/min (SrCr 1.18 mg/dl 08/23/2022) Platelet count: need updated lab last one on file is from 2017   Per office protocol, patient can hold Eliquis for 2 days prior to procedure.   Patient will not need bridging with Lovenox (enoxaparin) around procedure.  **This guidance is not considered finalized until pre-operative APP has relayed final recommendations.**

## 2022-09-09 NOTE — Telephone Encounter (Addendum)
   Name: [name] [name]  DOB: August 29, 1960  MRN: [id number]   Primary Cardiologist: Sinclair Grooms, MD  Patient returned phone call on 09/09/2022 in reference to pre-operative risk assessment for pending surgery as outlined below.  [name] [name] was last seen on 07/30/2022 by Dr. Tamala Julian.  Since that day, [name] [name] has done well with no changes since his office visit.  Therefore, based on ACC/AHA guidelines, the patient would be at acceptable risk for the planned procedure without further cardiovascular testing.   The patient was advised that if he develops new symptoms prior to surgery to contact our office to arrange for a follow-up visit, and he verbalized understanding.  Per office protocol, patient can hold Eliquis for 2 days prior to procedure.   Patient will not need bridging with Lovenox (enoxaparin) around procedure.  I will route this recommendation to the requesting party via Epic fax function and remove from pre-op pool. Please call with questions.  Mable Fill, Marissa Nestle, NP 09/09/2022, 11:01 AM

## 2022-09-09 NOTE — Telephone Encounter (Signed)
   Pre-operative Risk Assessment    Patient Name: [name] [name]  DOB: 20-May-1961 MRN: [id number]      Request for Surgical Clearance    Procedure:   Colonoscopy  Date of Surgery:  Clearance 10/15/22                                 Surgeon:  Dr Alessandra Bevels Surgeon's Group or Practice Name:  [facility] Phone number:  [phone] Fax number:  [phone]   Type of Clearance Requested:   - Medical  - Pharmacy:  Hold Apixaban (Eliquis) 2 day hold   Type of Anesthesia:   Propofol   Additional requests/questions:   N/A  Job Founds T   09/09/2022, 7:52 AM

## 2022-09-10 DIAGNOSIS — I5022 Chronic systolic (congestive) heart failure: Secondary | ICD-10-CM

## 2022-09-10 LAB — CUP PACEART REMOTE DEVICE CHECK
Battery Remaining Longevity: 58 mo
Battery Voltage: 2.99 V
Brady Statistic RV Percent Paced: 2.43 %
Date Time Interrogation Session: 20240115031606
HighPow Impedance: 44 Ohm
HighPow Impedance: 55 Ohm
Implantable Lead Connection Status: 753985
Implantable Lead Implant Date: 20070921
Implantable Lead Location: 753860
Implantable Lead Model: 6947
Implantable Pulse Generator Implant Date: 20170111
Lead Channel Impedance Value: 418 Ohm
Lead Channel Impedance Value: 475 Ohm
Lead Channel Pacing Threshold Amplitude: 0.5 V
Lead Channel Pacing Threshold Pulse Width: 0.4 ms
Lead Channel Sensing Intrinsic Amplitude: 21.25 mV
Lead Channel Sensing Intrinsic Amplitude: 21.25 mV
Lead Channel Setting Pacing Amplitude: 2.5 V
Lead Channel Setting Pacing Pulse Width: 0.4 ms
Lead Channel Setting Sensing Sensitivity: 0.3 mV
Zone Setting Status: 755011
Zone Setting Status: 755011

## 2022-09-12 ENCOUNTER — Other Ambulatory Visit: Payer: Self-pay | Admitting: Gastroenterology

## 2022-09-16 ENCOUNTER — Other Ambulatory Visit (HOSPITAL_COMMUNITY): Payer: Self-pay | Admitting: Internal Medicine

## 2022-10-04 ENCOUNTER — Encounter (HOSPITAL_COMMUNITY): Payer: Self-pay | Admitting: Gastroenterology

## 2022-10-07 NOTE — Progress Notes (Signed)
Attempted to obtain medical history via telephone, unable to reach at this time. HIPAA compliant voicemail message left requesting return call to pre surgical testing department. 

## 2022-10-12 ENCOUNTER — Other Ambulatory Visit (HOSPITAL_COMMUNITY): Payer: Self-pay | Admitting: Internal Medicine

## 2022-10-14 NOTE — Anesthesia Preprocedure Evaluation (Signed)
Anesthesia Evaluation  Patient identified by MRN, date of birth, ID band Patient awake    Reviewed: Allergy & Precautions, NPO status , Patient's Chart, lab work & pertinent test results  History of Anesthesia Complications Negative for: history of anesthetic complications  Airway Mallampati: II  TM Distance: >3 FB Neck ROM: Full    Dental no notable dental hx. (+) Dental Advisory Given   Pulmonary neg pulmonary ROS   Pulmonary exam normal        Cardiovascular + CAD, + Past MI, + Cardiac Stents and + CABG  Normal cardiovascular exam+ Cardiac Defibrillator   Myoperfusion 2021  Nuclear stress EF: 21%.  The left ventricular ejection fraction is severely decreased (<30%).  There was no ST segment deviation noted during stress.  There is a large defect of severe severity present in the mid anterior, mid anteroseptal, apical anterior, apical septal, apical inferior and apex location. The defect is non-reversible. This is consistent with infarct and no ischemia.  This is a high risk study secondary to severe LV dysfunction.  Findings consistent with prior myocardial infarction. No ischemia noted.      Neuro/Psych negative neurological ROS     GI/Hepatic negative GI ROS, Neg liver ROS,,,  Endo/Other  Hypothyroidism    Renal/GU negative Renal ROS     Musculoskeletal negative musculoskeletal ROS (+)    Abdominal   Peds  Hematology negative hematology ROS (+)   Anesthesia Other Findings   Reproductive/Obstetrics                             Anesthesia Physical Anesthesia Plan  ASA: 3  Anesthesia Plan: MAC   Post-op Pain Management: Minimal or no pain anticipated   Induction:   PONV Risk Score and Plan: 1 and Propofol infusion  Airway Management Planned: Simple Face Mask and Natural Airway  Additional Equipment:   Intra-op Plan:   Post-operative Plan:   Informed Consent: I  have reviewed the patients History and Physical, chart, labs and discussed the procedure including the risks, benefits and alternatives for the proposed anesthesia with the patient or authorized representative who has indicated his/her understanding and acceptance.     Dental advisory given  Plan Discussed with: Anesthesiologist and CRNA  Anesthesia Plan Comments:        Anesthesia Quick Evaluation

## 2022-10-15 ENCOUNTER — Encounter (HOSPITAL_COMMUNITY): Payer: Self-pay | Admitting: Gastroenterology

## 2022-10-15 ENCOUNTER — Ambulatory Visit (HOSPITAL_COMMUNITY): Payer: BC Managed Care – PPO | Admitting: Anesthesiology

## 2022-10-15 ENCOUNTER — Encounter (HOSPITAL_COMMUNITY)
Admission: RE | Disposition: A | Discharge status: Home / Self Care | Payer: Self-pay | Source: Ambulatory Visit | Attending: Gastroenterology

## 2022-10-15 ENCOUNTER — Other Ambulatory Visit: Payer: Self-pay

## 2022-10-15 ENCOUNTER — Ambulatory Visit (HOSPITAL_COMMUNITY)
Admission: RE | Admit: 2022-10-15 | Discharge: 2022-10-15 | Disposition: A | Discharge status: Home / Self Care | Payer: BC Managed Care – PPO | Source: Ambulatory Visit | Attending: Gastroenterology | Admitting: Gastroenterology

## 2022-10-15 DIAGNOSIS — I2510 Atherosclerotic heart disease of native coronary artery without angina pectoris: Secondary | ICD-10-CM | POA: Diagnosis not present

## 2022-10-15 DIAGNOSIS — Z7984 Long term (current) use of oral hypoglycemic drugs: Secondary | ICD-10-CM | POA: Insufficient documentation

## 2022-10-15 DIAGNOSIS — E785 Hyperlipidemia, unspecified: Secondary | ICD-10-CM | POA: Diagnosis not present

## 2022-10-15 DIAGNOSIS — Z951 Presence of aortocoronary bypass graft: Secondary | ICD-10-CM | POA: Insufficient documentation

## 2022-10-15 DIAGNOSIS — I480 Paroxysmal atrial fibrillation: Secondary | ICD-10-CM | POA: Insufficient documentation

## 2022-10-15 DIAGNOSIS — K648 Other hemorrhoids: Secondary | ICD-10-CM | POA: Insufficient documentation

## 2022-10-15 DIAGNOSIS — Z955 Presence of coronary angioplasty implant and graft: Secondary | ICD-10-CM | POA: Diagnosis not present

## 2022-10-15 DIAGNOSIS — I252 Old myocardial infarction: Secondary | ICD-10-CM | POA: Diagnosis not present

## 2022-10-15 DIAGNOSIS — Z79899 Other long term (current) drug therapy: Secondary | ICD-10-CM | POA: Insufficient documentation

## 2022-10-15 DIAGNOSIS — I255 Ischemic cardiomyopathy: Secondary | ICD-10-CM | POA: Diagnosis not present

## 2022-10-15 DIAGNOSIS — Z79890 Hormone replacement therapy (postmenopausal): Secondary | ICD-10-CM | POA: Insufficient documentation

## 2022-10-15 DIAGNOSIS — I5022 Chronic systolic (congestive) heart failure: Secondary | ICD-10-CM | POA: Insufficient documentation

## 2022-10-15 DIAGNOSIS — Z83719 Family history of colon polyps, unspecified: Secondary | ICD-10-CM | POA: Insufficient documentation

## 2022-10-15 DIAGNOSIS — Z7901 Long term (current) use of anticoagulants: Secondary | ICD-10-CM | POA: Diagnosis not present

## 2022-10-15 DIAGNOSIS — Z1211 Encounter for screening for malignant neoplasm of colon: Secondary | ICD-10-CM | POA: Diagnosis not present

## 2022-10-15 DIAGNOSIS — E039 Hypothyroidism, unspecified: Secondary | ICD-10-CM | POA: Insufficient documentation

## 2022-10-15 DIAGNOSIS — Z95810 Presence of automatic (implantable) cardiac defibrillator: Secondary | ICD-10-CM | POA: Insufficient documentation

## 2022-10-15 DIAGNOSIS — K5730 Diverticulosis of large intestine without perforation or abscess without bleeding: Secondary | ICD-10-CM | POA: Insufficient documentation

## 2022-10-15 HISTORY — PX: COLONOSCOPY WITH PROPOFOL: SHX5780

## 2022-10-15 SURGERY — COLONOSCOPY WITH PROPOFOL
Anesthesia: Monitor Anesthesia Care

## 2022-10-15 MED ORDER — ONDANSETRON HCL 4 MG/2ML IJ SOLN
INTRAMUSCULAR | Status: DC | PRN
  Administered 2022-10-15: 4 mg via INTRAVENOUS

## 2022-10-15 MED ORDER — PHENYLEPHRINE 80 MCG/ML (10ML) SYRINGE FOR IV PUSH (FOR BLOOD PRESSURE SUPPORT)
PREFILLED_SYRINGE | INTRAVENOUS | Status: DC | PRN
  Administered 2022-10-15: 120 ug via INTRAVENOUS

## 2022-10-15 MED ORDER — PROPOFOL 10 MG/ML IV BOLUS
INTRAVENOUS | Status: DC | PRN
  Administered 2022-10-15: 50 mg via INTRAVENOUS
  Administered 2022-10-15: 20 mg via INTRAVENOUS

## 2022-10-15 MED ORDER — LACTATED RINGERS IV SOLN
INTRAVENOUS | Status: AC | PRN
  Administered 2022-10-15: 1000 mL via INTRAVENOUS

## 2022-10-15 MED ORDER — EPHEDRINE SULFATE-NACL 50-0.9 MG/10ML-% IV SOSY
PREFILLED_SYRINGE | INTRAVENOUS | Status: DC | PRN
  Administered 2022-10-15 (×5): 5 mg via INTRAVENOUS

## 2022-10-15 MED ORDER — SODIUM CHLORIDE 0.9 % IV SOLN: INTRAVENOUS | Status: DC

## 2022-10-15 MED ORDER — PROPOFOL 1000 MG/100ML IV EMUL
INTRAVENOUS | Status: AC
  Filled 2022-10-15: qty 100

## 2022-10-15 MED ORDER — PROPOFOL 500 MG/50ML IV EMUL
INTRAVENOUS | Status: DC | PRN
  Administered 2022-10-15: 100 ug/kg/min via INTRAVENOUS

## 2022-10-15 SURGICAL SUPPLY — 22 items

## 2022-10-15 NOTE — Transfer of Care (Signed)
Immediate Anesthesia Transfer of Care Note  Patient: [name] [name]  Procedure(s) Performed: Procedure(s): COLONOSCOPY WITH PROPOFOL (N/A)  Patient Location: PACU  Anesthesia Type:MAC  Level of Consciousness: Patient easily awoken, sedated, comfortable, cooperative, following commands, responds to stimulation.   Airway & Oxygen Therapy: Patient spontaneously breathing, ventilating well, oxygen via simple oxygen mask.  Post-op Assessment: Report given to PACU RN, vital signs reviewed and stable, moving all extremities.   Post vital signs: Reviewed and stable.  Complications: No apparent anesthesia complications  Last Vitals:  Vitals Value Taken Time  BP 92/57 10/15/22 0818  Temp    Pulse 79 10/15/22 0818  Resp 29 10/15/22 0818  SpO2 100 % 10/15/22 0818  Vitals shown include unvalidated device data.  Last Pain:  Vitals:   10/15/22 0641  TempSrc: Tympanic  PainSc: 0-No pain         Complications: No notable events documented.

## 2022-10-15 NOTE — Discharge Instructions (Signed)

## 2022-10-15 NOTE — Anesthesia Postprocedure Evaluation (Signed)
Anesthesia Post Note  Patient: [name] [name]  Procedure(s) Performed: COLONOSCOPY WITH PROPOFOL     Patient location during evaluation: Endoscopy Anesthesia Type: MAC Level of consciousness: awake and alert Pain management: pain level controlled Vital Signs Assessment: post-procedure vital signs reviewed and stable Respiratory status: spontaneous breathing and respiratory function stable Cardiovascular status: stable Postop Assessment: no apparent nausea or vomiting Anesthetic complications: no  No notable events documented.  Last Vitals:  Vitals:   10/15/22 0837 10/15/22 0840  BP:  106/60  Pulse: 77 73  Resp: 20 19  Temp:    SpO2: 95% 95%    Last Pain:  Vitals:   10/15/22 0840  TempSrc:   PainSc: 0-No pain                 Thirza Pellicano DANIEL

## 2022-10-15 NOTE — Op Note (Signed)
[facility] Patient Name: [name] [name] Procedure Date: 10/15/2022 MRN: [id number] Attending MD: Otis Brace , MD, [id number] Date of Birth: 1961/08/26 CSN: [id number] Age: 62 Admit Type: Outpatient Procedure:                Colonoscopy Indications:              Colon cancer screening in patient at increased                            risk: Family history of colon polyps in multiple                            1st-degree relatives, Last colonoscopy: 2017 Providers:                Otis Brace, MD, Katina Degree, RN, Darliss Cheney, Technician Referring MD:              Medicines:                Sedation Administered by an Anesthesia Professional Complications:            No immediate complications. Estimated Blood Loss:     Estimated blood loss was minimal. Procedure:                Pre-Anesthesia Assessment:                           - Prior to the procedure, a History and Physical                            was performed, and patient medications and                            allergies were reviewed. The patient's tolerance of                            previous anesthesia was also reviewed. The risks                            and benefits of the procedure and the sedation                            options and risks were discussed with the patient.                            All questions were answered, and informed consent                            was obtained. Prior Anticoagulants: The patient has                            taken Eliquis (apixaban), last dose was 2 days  prior to procedure. ASA Grade Assessment: III - A                            patient with severe systemic disease. After                            reviewing the risks and benefits, the patient was                            deemed in satisfactory condition to undergo the                            procedure.                            After obtaining informed consent, the colonoscope                            was passed under direct vision. Throughout the                            procedure, the patient's blood pressure, pulse, and                            oxygen saturations were monitored continuously. The                            [id number]) Olympus colonoscope was                            introduced through the anus and advanced to the the                            terminal ileum, with identification of the                            appendiceal orifice and IC valve. The colonoscopy                            was performed without difficulty. The patient                            tolerated the procedure well. The quality of the                            bowel preparation was fair. The terminal ileum,                            ileocecal valve, appendiceal orifice, and rectum                            were photographed. Scope In: 7:57:53 AM Scope Out: 8:10:11 AM Scope Withdrawal Time: 0 hours 7 minutes 20 seconds  Total Procedure Duration: 0 hours 12 minutes 18 seconds  Findings:      Hemorrhoids were found on perianal exam.      The terminal ileum appeared normal.      Scattered diverticula were found in the entire colon.      Internal hemorrhoids were found during retroflexion. The hemorrhoids       were small.      The exam was otherwise without abnormality on direct and retroflexion       views. Impression:               - Preparation of the colon was fair.                           - Hemorrhoids found on perianal exam.                           - The examined portion of the ileum was normal.                           - Diverticulosis in the entire examined colon.                           - Internal hemorrhoids.                           - The examination was otherwise normal on direct                            and retroflexion views.                           - No specimens  collected. Moderate Sedation:      Moderate (conscious) sedation was personally administered by an       anesthesia professional. The following parameters were monitored: oxygen       saturation, heart rate, blood pressure, and response to care. Recommendation:           - Patient has a contact number available for                            emergencies. The signs and symptoms of potential                            delayed complications were discussed with the                            patient. Return to normal activities tomorrow.                            Written discharge instructions were provided to the                            patient.                           - Resume previous diet.                           -  Continue present medications.                           - Repeat colonoscopy in 5 years for screening                            purposes.                           - Return to my office PRN. Procedure Code(s):        --- Professional ---                           [id number], Colorectal cancer screening; colonoscopy on                            individual at high risk Diagnosis Code(s):        --- Professional ---                           Z83.71, Family history of colonic polyps                           K64.8, Other hemorrhoids                           K57.30, Diverticulosis of large intestine without                            perforation or abscess without bleeding CPT copyright 2022 [employer]. All rights reserved. The codes documented in this report are preliminary and upon coder review may  be revised to meet current compliance requirements. Otis Brace, MD Otis Brace, MD 10/15/2022 8:15:31 AM Number of Addenda: 0

## 2022-10-15 NOTE — Anesthesia Procedure Notes (Signed)
Procedure Name: MAC Date/Time: 10/15/2022 7:54 AM  Performed by: Deliah Boston, CRNAPre-anesthesia Checklist: Patient identified, Emergency Drugs available, Suction available and Patient being monitored Patient Re-evaluated:Patient Re-evaluated prior to induction Oxygen Delivery Method: Simple face mask Preoxygenation: Pre-oxygenation with 100% oxygen Placement Confirmation: positive ETCO2 and breath sounds checked- equal and bilateral

## 2022-10-15 NOTE — H&P (Signed)
Primary Care Physician:  Shirline Frees, MD Primary Gastroenterologist:  Dr. Alessandra Bevels  Reason for Consultation: Outpatient colonoscopy  HPI: [name] [name] is a 62 y.o. male here for outpatient screening colonoscopy.  Patient with family history of colon polyps in the brother, sister and mother. Last colonoscopy in 2017 showed benign polyp in the cecum and repeat was recommended in 5 years.         Patient with past medical history of ischemic cardiomyopathy with EF of around 25%, status post AICD placement, history of paroxysmal atrial fibrillation on Eliquis.  Holding Eliquis for 2 days.  Denies any GI symptoms.  Denies any active cardiac symptoms.  Past Medical History:  Diagnosis Date   CAD (coronary artery disease)    Anterior MI with emergent PCI followed by emergent CABG x 3 in April of 2007 per Dr. Roxan Hockey with SVG to LAD and SVG to ramus & OM1   Chronic systolic heart failure ([place])    Hyperlipidemia    ICD (implantable cardioverter-defibrillator) in place    MI (myocardial infarction) ([place]) 11/2005    Past Surgical History:  Procedure Laterality Date   CARDIAC CATHETERIZATION  2004, 2007   CARDIAC DEFIBRILLATOR PLACEMENT  04/2006   [employer]-Dr. Leonia Reeves   CAROTID STENT INSERTION     CHOLECYSTECTOMY     CORONARY ARTERY BYPASS GRAFT  11/2005   EP IMPLANTABLE DEVICE N/A 09/06/2015   Procedure: ICD Generator Changeout;  Surgeon: Evans Lance, MD;  Location: [facility];  Service: Cardiovascular;  Laterality: N/A;   VASECTOMY      Prior to Admission medications   Medication Sig Start Date End Date Taking? Authorizing Provider  aspirin 81 MG EC tablet Take 1 tablet (81 mg total) by mouth daily. 12/12/15  Yes Evans Lance, MD  carvedilol (COREG) 12.5 MG tablet TAKE 1 TABLET BY MOUTH TWICE A DAY WITH MEALS 12/25/21  Yes Belva Crome, MD  colestipol (COLESTID) 1 g tablet Take 1 g by mouth 2 (two) times daily. 06/27/22  Yes [provider]   CRESTOR 40 MG tablet Take 20 mg by mouth daily. 04/19/15  Yes [provider]  dapagliflozin propanediol (FARXIGA) 10 MG TABS tablet Take 1 tablet (10 mg total) by mouth daily before breakfast. 07/30/22  Yes Belva Crome, MD  diphenoxylate-atropine (LOMOTIL) 2.5-0.025 MG per tablet Take 1 tablet by mouth 4 (four) times daily as needed for diarrhea or loose stools.    Yes [provider]  ELIQUIS 5 MG TABS tablet TAKE 1 TABLET BY MOUTH TWICE A DAY 07/16/22  Yes Belva Crome, MD  ENTRESTO 49-51 MG TAKE 1 TABLET BY MOUTH TWICE A DAY 01/03/22  Yes Belva Crome, MD  fish oil-omega-3 fatty acids 1000 MG capsule Take 1 g by mouth daily.    Yes [provider]  levothyroxine (SYNTHROID, LEVOTHROID) 25 MCG tablet Take 25 mcg by mouth daily.   Yes [provider]  mirtazapine (REMERON) 30 MG tablet Take 30 mg by mouth at bedtime.   Yes [provider]  spironolactone (ALDACTONE) 25 MG tablet TAKE 0.5 TABLETS (12.5 MG TOTAL) BY MOUTH DAILY. NEEDS FOLLOW UP APPOINTMENT FOR ANYMORE REFILLS 10/14/22  Yes Bensimhon, Shaune Pascal, MD  nitroGLYCERIN (NITROSTAT) 0.4 MG SL tablet PLACE 1 TABLET UNDER THE TONGUE EVERY 5 MINUTES AS NEEDED FOR CHEST PAIN. 06/28/22   Evans Lance, MD  promethazine (PHENERGAN) 25 MG tablet Take 25 mg by mouth every 6 (six) hours as needed  for nausea or vomiting.    [provider]    Scheduled Meds: Continuous Infusions:  sodium chloride     lactated ringers 10 mL/hr at 10/15/22 0649   PRN Meds:.lactated ringers  Allergies as of 09/12/2022 - Review Complete 07/30/2022  Allergen Reaction Noted   Adhesive [tape] Other (See Comments) 10/01/2011   Bee venom Hives and Swelling 01/18/2020   Eggs or egg-derived products Nausea And Vomiting 10/01/2011   Penicillins Rash 10/01/2011   Ace inhibitors  05/14/2021    Family History  Problem Relation Age of Onset   Heart attack Father     Social History   Socioeconomic History    Marital status: Married    Spouse name: Not on file   Number of children: Not on file   Years of education: Not on file   Highest education level: Not on file  Occupational History   Not on file  Tobacco Use   Smoking status: Never   Smokeless tobacco: Never  Vaping Use   Vaping Use: Never used  Substance and Sexual Activity   Alcohol use: No    Alcohol/week: 0.0 standard drinks of alcohol   Drug use: No   Sexual activity: Not on file  Other Topics Concern   Not on file  Social History Narrative   Not on file   Social Determinants of Health   Financial Resource Strain: Not on file  Food Insecurity: Not on file  Transportation Needs: No Transportation Needs (07/04/2022)   PRAPARE - Transportation    Lack of Transportation (Medical): No    Lack of Transportation (Non-Medical): No  Physical Activity: Not on file  Stress: Not on file  Social Connections: Not on file  Intimate Partner Violence: Not on file    Review of Systems:   Physical Exam: Vital signs: Vitals:   10/15/22 0641  BP: 133/67  Pulse: 74  Resp: 20  Temp: 97.8 F (36.6 C)  SpO2: 96%     General:   Alert,  Well-developed, well-nourished, pleasant and cooperative in NAD Lungs:  Clear throughout to auscultation.   No wheezes, crackles, or rhonchi. No acute distress. Heart:  Regular rate and rhythm; no murmurs, clicks, rubs,  or gallops. Abdomen: Soft, nontender, nondistended, bowel sounds present, no peritoneal signs Rectal:  Deferred  GI:  Lab Results: No results for input(s): "WBC", "HGB", "HCT", "PLT" in the last 72 hours. BMET No results for input(s): "NA", "K", "CL", "CO2", "GLUCOSE", "BUN", "CREATININE", "CALCIUM" in the last 72 hours. LFT No results for input(s): "PROT", "ALBUMIN", "AST", "ALT", "ALKPHOS", "BILITOT", "BILIDIR", "IBILI" in the last 72 hours. PT/INR No results for input(s): "LABPROT", "INR" in the last 72 hours.   Studies/Results: No results  found.  Impression/Plan: -Family history of colon polyps and multiple family members -Ischemic cardiomyopathy.  Holding Eliquis for 2 days. -Bile salt induced diarrhea  Recommendations ------------------------- -Proceed with colonoscopy today.  Risks (bleeding, infection, bowel perforation that could require surgery, sedation-related changes in cardiopulmonary systems), benefits (identification and possible treatment of source of symptoms, exclusion of certain causes of symptoms), and alternatives (watchful waiting, radiographic imaging studies, empiric medical treatment)  were explained to patient/family in detail and patient wishes to proceed.     LOS: 0 days   Otis Brace  MD, FACP 10/15/2022, 7:48 AM  Contact #  [phone]

## 2022-10-17 NOTE — Progress Notes (Signed)
Remote ICD transmission.   

## 2022-10-18 ENCOUNTER — Encounter (HOSPITAL_COMMUNITY): Payer: Self-pay | Admitting: Gastroenterology

## 2022-11-07 ENCOUNTER — Other Ambulatory Visit (HOSPITAL_COMMUNITY): Payer: Self-pay | Admitting: Internal Medicine

## 2022-11-14 ENCOUNTER — Encounter (INDEPENDENT_AMBULATORY_CARE_PROVIDER_SITE_OTHER): Payer: BC Managed Care – PPO | Admitting: Ophthalmology

## 2022-12-01 NOTE — Progress Notes (Unsigned)
Cardiology Office Note   Date:  12/02/2022   ID:  [name] [name], DOB 10/27/1960, MRN [id number]  PCP:  Johny BlamerHarris, William, MD    No chief complaint on file.  CAD  Wt Readings from Last 3 Encounters:  12/02/22 208 lb (94.3 kg)  10/15/22 213 lb 10 oz (96.9 kg)  07/30/22 213 lb 9.6 oz (96.9 kg)       History of Present Illness: [name] [name] is a 62 y.o. male  with a hx of ostial LAD Taxus DES stent, subsequent acute stent thrombosis 2007 with occlusion extending into the left main requiring emergency coronary bypass grafting with saphenous vein grafts 2007.  Chronic systolic heart failure with EF <20 % 10/2019,  hyperlipidemia, and AICD placed.  Conversion to Entresto fall 2019.    On Entresto, Carvedilol, Spironolactone.  Marcelline DeistFarxiga was added in 2023.  Overall, has felt well.  Notes that when he does  a lot of yard work, his HR will be a higher then the usual.  Back pain also limits him as well.   Had an episode of palpitations in mid March.   Denies : Chest pain. Dizziness. Leg edema. Nitroglycerin use. Orthopnea. Palpitations. Paroxysmal nocturnal dyspnea. Shortness of breath. Syncope.        Past Medical History:  Diagnosis Date   CAD (coronary artery disease)    Anterior MI with emergent PCI followed by emergent CABG x 3 in April of 2007 per Dr. Dorris FetchHendrickson with SVG to LAD and SVG to ramus & OM1   Chronic systolic heart failure ([facility])    Hyperlipidemia    ICD (implantable cardioverter-defibrillator) in place    MI (myocardial infarction) ([facility]) 11/2005    Past Surgical History:  Procedure Laterality Date   CARDIAC CATHETERIZATION  2004, 2007   CARDIAC DEFIBRILLATOR PLACEMENT  04/2006   [employer] [facility]-Dr. Amil AmenEdmunds   CAROTID STENT INSERTION     CHOLECYSTECTOMY     COLONOSCOPY WITH PROPOFOL N/A 10/15/2022   Procedure: COLONOSCOPY WITH PROPOFOL;  Surgeon: Kathi DerBrahmbhatt, Parag, MD;  Location: [facility];  Service: Gastroenterology;  Laterality: N/A;   CORONARY  ARTERY BYPASS GRAFT  11/2005   EP IMPLANTABLE DEVICE N/A 09/06/2015   Procedure: ICD Generator Changeout;  Surgeon: Marinus MawGregg W Taylor, MD;  [facility];  Service: Cardiovascular;  Laterality: N/A;   VASECTOMY       Current Outpatient Medications  Medication Sig Dispense Refill   aspirin 81 MG EC tablet Take 1 tablet (81 mg total) by mouth daily.     carvedilol (COREG) 12.5 MG tablet TAKE 1 TABLET BY MOUTH TWICE A DAY WITH MEALS 180 tablet 3   colestipol (COLESTID) 1 g tablet Take 1 g by mouth 2 (two) times daily.     CRESTOR 40 MG tablet Take 20 mg by mouth daily.  5   dapagliflozin propanediol (FARXIGA) 10 MG TABS tablet Take 1 tablet (10 mg total) by mouth daily before breakfast. 30 tablet 11   diphenoxylate-atropine (LOMOTIL) 2.5-0.025 MG per tablet Take 1 tablet by mouth 4 (four) times daily as needed for diarrhea or loose stools.      ELIQUIS 5 MG TABS tablet TAKE 1 TABLET BY MOUTH TWICE A DAY 180 tablet 1   ENTRESTO 49-51 MG TAKE 1 TABLET BY MOUTH TWICE A DAY 180 tablet 3   fish oil-omega-3 fatty acids 1000 MG capsule Take 1 g by mouth daily.      ketoconazole (NIZORAL) 2 % cream Apply topically 2 (two)  times daily.     levothyroxine (SYNTHROID, LEVOTHROID) 25 MCG tablet Take 25 mcg by mouth daily.     loratadine-pseudoephedrine (CLARITIN-D 24-HOUR) 10-240 MG 24 hr tablet Take 1 tablet by mouth daily.     mirtazapine (REMERON) 30 MG tablet Take 30 mg by mouth at bedtime.     nitroGLYCERIN (NITROSTAT) 0.4 MG SL tablet PLACE 1 TABLET UNDER THE TONGUE EVERY 5 MINUTES AS NEEDED FOR CHEST PAIN. 25 tablet 5   promethazine (PHENERGAN) 25 MG tablet Take 25 mg by mouth every 6 (six) hours as needed for nausea or vomiting.     spironolactone (ALDACTONE) 25 MG tablet TAKE 0.5 TABLETS (12.5 MG TOTAL) BY MOUTH DAILY. NEEDS FOLLOW UP APPOINTMENT FOR ANYMORE REFILLS 15 tablet 0   No current facility-administered medications for this visit.    Allergies:   Adhesive [tape], Bee venom,  Egg-derived products, Penicillins, and Ace inhibitors    Social History:  The patient  reports that he has never smoked. He has never used smokeless tobacco. He reports that he does not drink alcohol and does not use drugs.   Family History:  The patient's family history includes Heart attack in his father.    ROS:  Please see the history of present illness.   Otherwise, review of systems are positive for palpitations.   All other systems are reviewed and negative.    PHYSICAL EXAM: VS:  BP 108/70   Pulse (!) 58   Ht 6' (1.829 m)   Wt 208 lb (94.3 kg)   SpO2 93%   BMI 28.21 kg/m  , BMI Body mass index is 28.21 kg/m. GEN: Well nourished, well developed, in no acute distress HEENT: normal Neck: no JVD, carotid bruits, or masses Cardiac: RRR; no murmurs, rubs, or gallops,no edema  Respiratory:  clear to auscultation bilaterally, normal work of breathing GI: soft, nontender, nondistended, + BS MS: no deformity or atrophy Skin: warm and dry, no rash Neuro:  Strength and sensation are intact Psych: euthymic mood, full affect   EKG:   The ekg ordered today demonstrates NSR, RBBB, PVC, LAFB   Recent Labs: 08/23/2022: BUN 15; Creatinine, Ser 1.18; Potassium CANCELED; Sodium 142   Lipid Panel No results found for: "CHOL", "TRIG", "HDL", "CHOLHDL", "VLDL", "LDLCALC", "LDLDIRECT"   Other studies Reviewed: Additional studies/ records that were reviewed today with results demonstrating: labs reviewed.   ASSESSMENT AND PLAN:  CAD: Status post CABG in 2007 after ostial LAD stent thrombosis and subsequent left main occlusion.  Taking baby aspirin. No bleeding issues.  No cath since his CABG.  Chronic systolic heart failure: He has been seen in the heart failure clinic by Dr. Gala Romney in past.  Continue medical therapy.  No CHF sx.  No volume overload.  AICD: Continue follow-up in the EP clinic with Dr. [name]. Hyperlipidemia: Continue high intensity statin therapy.  LDL 60 in  10/23.   Paroxysmal atrial fibrillation: He has had a burden of greater than 3% monitoring from his AICD.  Continue Eliquis for stroke prevention.  No bleeding problems.   Current medicines are reviewed at length with the patient today.  The patient concerns regarding his medicines were addressed.  The following changes have been made:  No change  Labs/ tests ordered today include:  No orders of the defined types were placed in this encounter.   Recommend 150 minutes/week of aerobic exercise Low fat, low carb, high fiber diet recommended  Disposition:   FU in 1 year; if he has any  new symptoms, would have a low threshold to repeat an echocardiogram   Signed, Lance Muss, MD  12/02/2022 2:35 PM    [facility] [address] [place], [place], [place]  [address] Phone: [phone]; Fax: [phone]

## 2022-12-02 ENCOUNTER — Ambulatory Visit: Payer: BC Managed Care – PPO | Attending: Interventional Cardiology | Admitting: Interventional Cardiology

## 2022-12-02 VITALS — BP 108/70 | HR 58 | Ht 72.0 in | Wt 208.0 lb

## 2022-12-02 DIAGNOSIS — I10 Essential (primary) hypertension: Secondary | ICD-10-CM

## 2022-12-02 DIAGNOSIS — I480 Paroxysmal atrial fibrillation: Secondary | ICD-10-CM

## 2022-12-02 DIAGNOSIS — I5022 Chronic systolic (congestive) heart failure: Secondary | ICD-10-CM

## 2022-12-02 DIAGNOSIS — E7849 Other hyperlipidemia: Secondary | ICD-10-CM

## 2022-12-02 DIAGNOSIS — Z95810 Presence of automatic (implantable) cardiac defibrillator: Secondary | ICD-10-CM | POA: Diagnosis not present

## 2022-12-02 DIAGNOSIS — Z7901 Long term (current) use of anticoagulants: Secondary | ICD-10-CM

## 2022-12-02 DIAGNOSIS — I2510 Atherosclerotic heart disease of native coronary artery without angina pectoris: Secondary | ICD-10-CM

## 2022-12-02 NOTE — Patient Instructions (Signed)
Medication Instructions:  Your physician recommends that you continue on your current medications as directed. Please refer to the Current Medication list given to you today.  *If you need a refill on your cardiac medications before your next appointment, please call your pharmacy*   Lab Work: none If you have labs (blood work) drawn today and your tests are completely normal, you will receive your results only by: MyChart Message (if you have MyChart) OR A paper copy in the mail If you have any lab test that is abnormal or we need to change your treatment, we will call you to review the results.   Testing/Procedures: none   Follow-Up: At [facility], you and your health needs are our priority.  As part of our continuing mission to provide you with exceptional heart care, we have created designated Provider Care Teams.  These Care Teams include your primary Cardiologist (physician) and Advanced Practice Providers (APPs -  Physician Assistants and Nurse Practitioners) who all work together to provide you with the care you need, when you need it.  We recommend signing up for the patient portal called "MyChart".  Sign up information is provided on this After Visit Summary.  MyChart is used to connect with patients for Virtual Visits (Telemedicine).  Patients are able to view lab/test results, encounter notes, upcoming appointments, etc.  Non-urgent messages can be sent to your provider as well.   To learn more about what you can do with MyChart, go to [email or web address].    Your next appointment:   12 month(s)  Provider:   Jayadeep Varanasi, MD     Other Instructions    

## 2022-12-09 ENCOUNTER — Other Ambulatory Visit: Payer: Self-pay

## 2022-12-09 MED ORDER — CARVEDILOL 12.5 MG PO TABS: 12.5000 mg | ORAL_TABLET | Freq: Two times a day (BID) | ORAL | 3 refills | Status: DC

## 2022-12-13 ENCOUNTER — Other Ambulatory Visit (HOSPITAL_COMMUNITY): Payer: Self-pay | Admitting: Internal Medicine

## 2022-12-16 ENCOUNTER — Encounter: Payer: Self-pay | Admitting: Interventional Cardiology

## 2022-12-16 MED ORDER — SPIRONOLACTONE 25 MG PO TABS: 12.5000 mg | ORAL_TABLET | Freq: Every day | ORAL | 11 refills | Status: DC

## 2022-12-20 ENCOUNTER — Other Ambulatory Visit: Payer: Self-pay

## 2022-12-20 MED ORDER — ENTRESTO 49-51 MG PO TABS: 1.0000 | ORAL_TABLET | Freq: Two times a day (BID) | ORAL | 3 refills | Status: DC

## 2022-12-30 ENCOUNTER — Ambulatory Visit: Payer: BC Managed Care – PPO | Attending: Internal Medicine | Admitting: Internal Medicine

## 2022-12-30 VITALS — BP 110/68 | HR 62 | Ht 72.0 in | Wt 213.0 lb

## 2022-12-30 DIAGNOSIS — I5022 Chronic systolic (congestive) heart failure: Secondary | ICD-10-CM

## 2022-12-30 DIAGNOSIS — Z95810 Presence of automatic (implantable) cardiac defibrillator: Secondary | ICD-10-CM | POA: Diagnosis not present

## 2022-12-30 DIAGNOSIS — I4891 Unspecified atrial fibrillation: Secondary | ICD-10-CM

## 2022-12-30 LAB — CUP PACEART INCLINIC DEVICE CHECK
Battery Remaining Longevity: 53 mo
Battery Voltage: 2.99 V
Brady Statistic RV Percent Paced: 2.3 %
Date Time Interrogation Session: 20240506130031
HighPow Impedance: 49 Ohm
HighPow Impedance: 66 Ohm
Implantable Lead Connection Status: 753985
Implantable Lead Implant Date: 20070921
Implantable Lead Location: 753860
Implantable Lead Model: 6947
Implantable Pulse Generator Implant Date: 20170111
Lead Channel Impedance Value: 456 Ohm
Lead Channel Impedance Value: 551 Ohm
Lead Channel Pacing Threshold Amplitude: 0.5 V
Lead Channel Pacing Threshold Pulse Width: 0.4 ms
Lead Channel Sensing Intrinsic Amplitude: 23.375 mV
Lead Channel Sensing Intrinsic Amplitude: 26.5 mV
Lead Channel Setting Pacing Amplitude: 2.5 V
Lead Channel Setting Pacing Pulse Width: 0.4 ms
Lead Channel Setting Sensing Sensitivity: 0.3 mV
Zone Setting Status: 755011
Zone Setting Status: 755011

## 2022-12-30 NOTE — Patient Instructions (Signed)
Medication Instructions:  Your physician recommends that you continue on your current medications as directed. Please refer to the Current Medication list given to you today.  *If you need a refill on your cardiac medications before your next appointment, please call your pharmacy*  Lab Work: None ordered.  If you have labs (blood work) drawn today and your tests are completely normal, you will receive your results only by: MyChart Message (if you have MyChart) OR A paper copy in the mail If you have any lab test that is abnormal or we need to change your treatment, we will call you to review the results.  Testing/Procedures: None ordered.  Follow-Up: At [facility], you and your health needs are our priority.  As part of our continuing mission to provide you with exceptional heart care, we have created designated Provider Care Teams.  These Care Teams include your primary Cardiologist (physician) and Advanced Practice Providers (APPs -  Physician Assistants and Nurse Practitioners) who all work together to provide you with the care you need, when you need it.   Your next appointment:   1 year(s)  The format for your next appointment:   In Person  Provider:   Lewayne Bunting, MD{or one of the following Advanced Practice Providers on your designated Care Team:   Francis Dowse, [place] Casimiro Needle "Mardelle Matte" Lanna Poche, [place]  Remote monitoring is used to monitor your ICD from home. This monitoring reduces the number of office visits required to check your device to one time per year. It allows [place] to keep an eye on the functioning of your device to ensure it is working properly. You are scheduled for a device check from home on 03/10/23. You may send your transmission at any time that day. If you have a wireless device, the transmission will be sent automatically. After your physician reviews your transmission, you will receive a postcard with your next transmission date.

## 2022-12-30 NOTE — Progress Notes (Signed)
HPI [name] returns today for ICD followup. He is a pleasant 62 yo man with a h/o CAD ,s/p MI, CABG, with persistent LV dysfunction and class 2 CHF, s/p ICD insertion. In the interim, he notes that he had a couple of episodes where he felt like his heart was beating differently. No syncope or ICD therapies.  Allergies  Allergen Reactions   Adhesive [Tape] Other (See Comments)    REACTION: "IT BURNS ME"   Bee Venom Hives and Swelling   Egg-Derived Products Nausea And Vomiting   Penicillins Rash    Has patient had a PCN reaction causing immediate rash, facial/tongue/throat swelling, SOB or lightheadedness with hypotension: Yes Has patient had a PCN reaction causing severe rash involving mucus membranes or skin necrosis: No Has patient had a PCN reaction that required hospitalization No Has patient had a PCN reaction occurring within the last 10 years: No If all of the above answers are "NO", then may proceed with Cephalosporin use.    Ace Inhibitors     Other reaction(s): cough     Current Outpatient Medications  Medication Sig Dispense Refill   aspirin 81 MG EC tablet Take 1 tablet (81 mg total) by mouth daily.     carvedilol (COREG) 12.5 MG tablet Take 1 tablet (12.5 mg total) by mouth 2 (two) times daily with a meal. 180 tablet 3   Cetirizine HCl (ZYRTEC ALLERGY PO) Take 1 tablet by mouth daily.     colestipol (COLESTID) 1 g tablet Take 1 g by mouth 2 (two) times daily.     CRESTOR 40 MG tablet Take 20 mg by mouth daily.  5   dapagliflozin propanediol (FARXIGA) 10 MG TABS tablet Take 1 tablet (10 mg total) by mouth daily before breakfast. 30 tablet 11   diphenoxylate-atropine (LOMOTIL) 2.5-0.025 MG per tablet Take 1 tablet by mouth 4 (four) times daily as needed for diarrhea or loose stools.      ELIQUIS 5 MG TABS tablet TAKE 1 TABLET BY MOUTH TWICE A DAY 180 tablet 1   fish oil-omega-3 fatty acids 1000 MG capsule Take 1 g by mouth daily.      ketoconazole (NIZORAL) 2  % cream Apply topically 2 (two) times daily.     levothyroxine (SYNTHROID, LEVOTHROID) 25 MCG tablet Take 25 mcg by mouth daily.     mirtazapine (REMERON) 30 MG tablet Take 30 mg by mouth at bedtime.     nitroGLYCERIN (NITROSTAT) 0.4 MG SL tablet PLACE 1 TABLET UNDER THE TONGUE EVERY 5 MINUTES AS NEEDED FOR CHEST PAIN. 25 tablet 5   promethazine (PHENERGAN) 25 MG tablet Take 25 mg by mouth every 6 (six) hours as needed for nausea or vomiting.     sacubitril-valsartan (ENTRESTO) 49-51 MG Take 1 tablet by mouth 2 (two) times daily. 180 tablet 3   spironolactone (ALDACTONE) 25 MG tablet Take 0.5 tablets (12.5 mg total) by mouth daily. 15 tablet 11   No current facility-administered medications for this visit.     Past Medical History:  Diagnosis Date   CAD (coronary artery disease)    Anterior MI with emergent PCI followed by emergent CABG x 3 in April of 2007 per Dr. Dorris Fetch with SVG to LAD and SVG to ramus & OM1   Chronic systolic heart failure ([facility])    Hyperlipidemia    ICD (implantable cardioverter-defibrillator) in place    MI (myocardial infarction) ([facility]) 11/2005    ROS:   All systems reviewed and  negative except as noted in the HPI.   Past Surgical History:  Procedure Laterality Date   CARDIAC CATHETERIZATION  2004, 2007   CARDIAC DEFIBRILLATOR PLACEMENT  04/2006   [employer] [facility]-Dr. Amil Amen   CAROTID STENT INSERTION     CHOLECYSTECTOMY     COLONOSCOPY WITH PROPOFOL N/A 10/15/2022   Procedure: COLONOSCOPY WITH PROPOFOL;  Surgeon: Kathi Der, MD;  Location: [facility];  Service: Gastroenterology;  Laterality: N/A;   CORONARY ARTERY BYPASS GRAFT  11/2005   EP IMPLANTABLE DEVICE N/A 09/06/2015   Procedure: ICD Generator Changeout;  Surgeon: Marinus Maw, MD;  Location: [facility];  Service: Cardiovascular;  Laterality: N/A;   VASECTOMY       Family History  Problem Relation Age of Onset   Heart attack Father      Social History   Socioeconomic  History   Marital status: Married    Spouse name: Not on file   Number of children: Not on file   Years of education: Not on file   Highest education level: Not on file  Occupational History   Not on file  Tobacco Use   Smoking status: Never   Smokeless tobacco: Never  Vaping Use   Vaping Use: Never used  Substance and Sexual Activity   Alcohol use: No    Alcohol/week: 0.0 standard drinks of alcohol   Drug use: No   Sexual activity: Not on file  Other Topics Concern   Not on file  Social History Narrative   Not on file   Social Determinants of Health   Financial Resource Strain: Not on file  Food Insecurity: Not on file  Transportation Needs: No Transportation Needs (07/04/2022)   PRAPARE - Administrator, Civil Service (Medical): No    Lack of Transportation (Non-Medical): No  Physical Activity: Not on file  Stress: Not on file  Social Connections: Not on file  Intimate Partner Violence: Not on file     BP 110/68   Pulse 62   Ht 6' (1.829 m)   Wt 213 lb (96.6 kg)   SpO2 96%   BMI 28.89 kg/m   Physical Exam:  Well appearing NAD HEENT: Unremarkable Neck:  No JVD, no thyromegally Lymphatics:  No adenopathy Back:  No CVA tenderness Lungs:  Clear with no wheezes HEART:  Regular rate rhythm, no murmurs, no rubs, no clicks Abd:  soft, positive bowel sounds, no organomegally, no rebound, no guarding Ext:  2 plus pulses, no edema, no cyanosis, no clubbing Skin:  No rashes no nodules Neuro:  CN II through XII intact, motor grossly intact  EKG - reviewed  DEVICE  Normal device function.  See PaceArt for details.   Assess/Plan: ICM - he denies anginal symptoms. No change in meds. PAF - he has had a couple of episodes lasting over 10 hours but appears to be minimally symptomatic. No change in treatment. If he has more, we would consider referral for catheter ablation.  Chronic systolic heart failure - his symptoms are class 2A. He will continue  GDMT. ICD - his Medtronic single chamber ICD is working normally. We will recheck in several months.   Sharlot Gowda Holly Iannaccone,MD

## 2023-02-20 ENCOUNTER — Other Ambulatory Visit: Payer: Self-pay | Admitting: *Deleted

## 2023-02-20 DIAGNOSIS — I480 Paroxysmal atrial fibrillation: Secondary | ICD-10-CM

## 2023-02-20 MED ORDER — APIXABAN 5 MG PO TABS: 5.0000 mg | ORAL_TABLET | Freq: Two times a day (BID) | ORAL | 1 refills | Status: DC

## 2023-02-20 NOTE — Telephone Encounter (Signed)
Eliquis 5mg  refill request received. Patient is 62 years old, weight-96.6kg, Crea-1.18 on 08/23/22, Diagnosis-Afib, and last seen by Dr. [name] on 12/30/22. Dose is appropriate based on dosing criteria. Will send in refill to requested pharmacy.

## 2023-03-10 ENCOUNTER — Ambulatory Visit: Payer: BC Managed Care – PPO

## 2023-03-10 DIAGNOSIS — I255 Ischemic cardiomyopathy: Secondary | ICD-10-CM | POA: Diagnosis not present

## 2023-03-10 LAB — CUP PACEART REMOTE DEVICE CHECK
Battery Remaining Longevity: 48 mo
Battery Voltage: 2.99 V
Brady Statistic RV Percent Paced: 2.12 %
Date Time Interrogation Session: 20240715033423
HighPow Impedance: 45 Ohm
HighPow Impedance: 56 Ohm
Implantable Lead Connection Status: 753985
Implantable Lead Implant Date: 20070921
Implantable Lead Location: 753860
Implantable Lead Model: 6947
Implantable Pulse Generator Implant Date: 20170111
Lead Channel Impedance Value: 399 Ohm
Lead Channel Impedance Value: 475 Ohm
Lead Channel Pacing Threshold Amplitude: 0.5 V
Lead Channel Pacing Threshold Pulse Width: 0.4 ms
Lead Channel Sensing Intrinsic Amplitude: 22.625 mV
Lead Channel Sensing Intrinsic Amplitude: 22.625 mV
Lead Channel Setting Pacing Amplitude: 2.5 V
Lead Channel Setting Pacing Pulse Width: 0.4 ms
Lead Channel Setting Sensing Sensitivity: 0.3 mV
Zone Setting Status: 755011
Zone Setting Status: 755011

## 2023-03-13 DIAGNOSIS — L821 Other seborrheic keratosis: Secondary | ICD-10-CM | POA: Diagnosis not present

## 2023-03-13 DIAGNOSIS — L570 Actinic keratosis: Secondary | ICD-10-CM | POA: Diagnosis not present

## 2023-03-21 NOTE — Progress Notes (Signed)
Remote ICD transmission.   

## 2023-05-23 DIAGNOSIS — Z125 Encounter for screening for malignant neoplasm of prostate: Secondary | ICD-10-CM | POA: Diagnosis not present

## 2023-05-23 DIAGNOSIS — I10 Essential (primary) hypertension: Secondary | ICD-10-CM | POA: Diagnosis not present

## 2023-05-23 DIAGNOSIS — E7800 Pure hypercholesterolemia, unspecified: Secondary | ICD-10-CM | POA: Diagnosis not present

## 2023-05-30 DIAGNOSIS — Z0000 Encounter for general adult medical examination without abnormal findings: Secondary | ICD-10-CM | POA: Diagnosis not present

## 2023-05-30 DIAGNOSIS — L821 Other seborrheic keratosis: Secondary | ICD-10-CM | POA: Diagnosis not present

## 2023-05-30 DIAGNOSIS — I10 Essential (primary) hypertension: Secondary | ICD-10-CM | POA: Diagnosis not present

## 2023-05-30 DIAGNOSIS — L570 Actinic keratosis: Secondary | ICD-10-CM | POA: Diagnosis not present

## 2023-07-01 DIAGNOSIS — E7800 Pure hypercholesterolemia, unspecified: Secondary | ICD-10-CM | POA: Diagnosis not present

## 2023-07-01 DIAGNOSIS — Z23 Encounter for immunization: Secondary | ICD-10-CM | POA: Diagnosis not present

## 2023-07-01 DIAGNOSIS — R972 Elevated prostate specific antigen [PSA]: Secondary | ICD-10-CM | POA: Diagnosis not present

## 2023-08-15 ENCOUNTER — Other Ambulatory Visit: Payer: Self-pay | Admitting: Internal Medicine

## 2023-08-15 DIAGNOSIS — I480 Paroxysmal atrial fibrillation: Secondary | ICD-10-CM

## 2023-08-18 ENCOUNTER — Other Ambulatory Visit: Payer: Self-pay

## 2023-08-18 ENCOUNTER — Other Ambulatory Visit: Payer: Self-pay | Admitting: Interventional Cardiology

## 2023-08-18 MED ORDER — DAPAGLIFLOZIN PROPANEDIOL 10 MG PO TABS: 10.0000 mg | ORAL_TABLET | Freq: Every day | ORAL | 3 refills | Status: DC

## 2023-08-18 NOTE — Telephone Encounter (Signed)
Prescription refill request for Eliquis received. Indication:afib Last office visit:7/24 Scr:1.18  12/23 Age: 62 Weight:96.6  kg  Prescription refilled

## 2023-08-19 ENCOUNTER — Other Ambulatory Visit (HOSPITAL_COMMUNITY): Payer: Self-pay

## 2023-08-21 ENCOUNTER — Other Ambulatory Visit: Payer: Self-pay | Admitting: Interventional Cardiology

## 2023-08-21 ENCOUNTER — Other Ambulatory Visit (HOSPITAL_COMMUNITY): Payer: Self-pay

## 2023-08-21 ENCOUNTER — Telehealth: Payer: Self-pay | Admitting: Pharmacy Technician

## 2023-08-21 NOTE — Telephone Encounter (Signed)
Pharmacy Patient Advocate Encounter   Received notification from Pt Calls Messages that prior authorization for Marcelline Deist is required/requested.   Insurance verification completed.   The patient is insured through [facility] .   Per test claim: The current 08/19/23 day co-pay is, $30.00- one month  BRAND only.  No PA needed at this time. This test claim was processed through [facility]- copay amounts may vary at other pharmacies due to pharmacy/plan contracts, or as the patient moves through the different stages of their insurance plan.

## 2023-08-31 ENCOUNTER — Encounter: Payer: Self-pay | Admitting: Internal Medicine

## 2023-09-01 ENCOUNTER — Other Ambulatory Visit: Payer: Self-pay

## 2023-09-01 MED ORDER — DAPAGLIFLOZIN PROPANEDIOL 10 MG PO TABS: 10.0000 mg | ORAL_TABLET | Freq: Every day | ORAL | 1 refills | Status: DC

## 2023-09-02 DIAGNOSIS — H90A22 Sensorineural hearing loss, unilateral, left ear, with restricted hearing on the contralateral side: Secondary | ICD-10-CM | POA: Diagnosis not present

## 2023-09-08 ENCOUNTER — Ambulatory Visit (INDEPENDENT_AMBULATORY_CARE_PROVIDER_SITE_OTHER): Payer: BC Managed Care – PPO

## 2023-09-08 DIAGNOSIS — I255 Ischemic cardiomyopathy: Secondary | ICD-10-CM

## 2023-09-08 DIAGNOSIS — I5022 Chronic systolic (congestive) heart failure: Secondary | ICD-10-CM

## 2023-09-08 LAB — CUP PACEART REMOTE DEVICE CHECK
Battery Remaining Longevity: 46 mo
Battery Voltage: 2.98 V
Brady Statistic RV Percent Paced: 1.05 %
Date Time Interrogation Session: 20250113031706
HighPow Impedance: 45 Ohm
HighPow Impedance: 58 Ohm
Implantable Lead Connection Status: 753985
Implantable Lead Implant Date: 20070921
Implantable Lead Location: 753860
Implantable Lead Model: 6947
Implantable Pulse Generator Implant Date: 20170111
Lead Channel Impedance Value: 456 Ohm
Lead Channel Impedance Value: 513 Ohm
Lead Channel Pacing Threshold Amplitude: 0.5 V
Lead Channel Pacing Threshold Pulse Width: 0.4 ms
Lead Channel Sensing Intrinsic Amplitude: 23.125 mV
Lead Channel Sensing Intrinsic Amplitude: 23.125 mV
Lead Channel Setting Pacing Amplitude: 2.5 V
Lead Channel Setting Pacing Pulse Width: 0.4 ms
Lead Channel Setting Sensing Sensitivity: 0.3 mV
Zone Setting Status: 755011
Zone Setting Status: 755011

## 2023-10-16 NOTE — Progress Notes (Signed)
 Remote ICD transmission.

## 2023-11-10 DIAGNOSIS — H2513 Age-related nuclear cataract, bilateral: Secondary | ICD-10-CM | POA: Diagnosis not present

## 2023-11-10 DIAGNOSIS — H4311 Vitreous hemorrhage, right eye: Secondary | ICD-10-CM | POA: Diagnosis not present

## 2023-11-10 DIAGNOSIS — H2511 Age-related nuclear cataract, right eye: Secondary | ICD-10-CM | POA: Diagnosis not present

## 2023-11-10 DIAGNOSIS — H2512 Age-related nuclear cataract, left eye: Secondary | ICD-10-CM | POA: Diagnosis not present

## 2023-11-10 DIAGNOSIS — H43811 Vitreous degeneration, right eye: Secondary | ICD-10-CM | POA: Diagnosis not present

## 2023-11-22 ENCOUNTER — Other Ambulatory Visit: Payer: Self-pay | Admitting: Internal Medicine

## 2023-11-24 ENCOUNTER — Other Ambulatory Visit: Payer: Self-pay | Admitting: Internal Medicine

## 2023-11-29 ENCOUNTER — Other Ambulatory Visit: Payer: Self-pay | Admitting: Internal Medicine

## 2023-12-03 ENCOUNTER — Other Ambulatory Visit: Payer: Self-pay | Admitting: Interventional Cardiology

## 2023-12-04 ENCOUNTER — Other Ambulatory Visit: Payer: Self-pay | Admitting: Interventional Cardiology

## 2023-12-16 DIAGNOSIS — Z23 Encounter for immunization: Secondary | ICD-10-CM | POA: Diagnosis not present

## 2023-12-18 ENCOUNTER — Other Ambulatory Visit: Payer: Self-pay | Admitting: Interventional Cardiology

## 2024-01-17 ENCOUNTER — Other Ambulatory Visit: Payer: Self-pay | Admitting: Cardiology

## 2024-01-21 ENCOUNTER — Encounter: Payer: Self-pay | Admitting: Cardiology

## 2024-01-21 ENCOUNTER — Ambulatory Visit: Payer: BC Managed Care – PPO | Attending: Cardiology | Admitting: Cardiology

## 2024-01-21 VITALS — BP 122/75 | HR 67 | Ht 72.0 in | Wt 213.0 lb

## 2024-01-21 DIAGNOSIS — R0989 Other specified symptoms and signs involving the circulatory and respiratory systems: Secondary | ICD-10-CM

## 2024-01-21 DIAGNOSIS — I5022 Chronic systolic (congestive) heart failure: Secondary | ICD-10-CM | POA: Diagnosis not present

## 2024-01-21 NOTE — Patient Instructions (Signed)
 Medication Instructions:  The current medical regimen is effective;  continue present plan and medications.  *If you need a refill on your cardiac medications before your next appointment, please call your pharmacy*  Testing/Procedures: Your physician has requested that you have an echocardiogram. Echocardiography is a painless test that uses sound waves to create images of your heart. It provides your doctor with information about the size and shape of your heart and how well your heart's chambers and valves are working. This procedure takes approximately one hour. There are no restrictions for this procedure. Please do NOT wear cologne, perfume, aftershave, or lotions (deodorant is allowed). Please arrive 15 minutes prior to your appointment time.  Please note: We ask at that you not bring children with you during ultrasound (echo/ vascular) testing. Due to room size and safety concerns, children are not allowed in the ultrasound rooms during exams. Our front office staff cannot provide observation of children in our lobby area while testing is being conducted. An adult accompanying a patient to their appointment will only be allowed in the ultrasound room at the discretion of the ultrasound technician under special circumstances. We apologize for any inconvenience.  Your physician has requested that you have a carotid duplex. This test is an ultrasound of the carotid arteries in your neck. It looks at blood flow through these arteries that supply the brain with blood. Allow one hour for this exam. There are no restrictions or special instructions.   Follow-Up: At [facility], you and your health needs are our priority.  As part of our continuing mission to provide you with exceptional heart care, our providers are all part of one team.  This team includes your primary Cardiologist (physician) and Advanced Practice Providers or APPs (Physician Assistants and Nurse Practitioners) who all  work together to provide you with the care you need, when you need it.  Your next appointment:   1 year(s)  Provider:   Dr Dorothye Gathers    We recommend signing up for the patient portal called "MyChart".  Sign up information is provided on this After Visit Summary.  MyChart is used to connect with patients for Virtual Visits (Telemedicine).  Patients are able to view lab/test results, encounter notes, upcoming appointments, etc.  Non-urgent messages can be sent to your provider as well.   To learn more about what you can do with MyChart, go to [email or web address].

## 2024-01-21 NOTE — Progress Notes (Signed)
 Cardiology Office Note:  .   Date:  01/21/2024  ID:  [name], [name] 04/21/1961, MRN [id number] PCP: Roselind Congo, MD  [facility] Providers Cardiologist:  Dorothye Gathers, MD Advanced Heart Failure:  Jules Oar, MD    History of Present Illness: .   [name] [name] is a 63 y.o. male Discussed the use of AI scribe software for clinical note transcription with the patient, who gave verbal consent to proceed.  History of Present Illness [name] [name] is a 63 year old male with coronary artery disease and heart failure who presents for follow-up.  He has a history of coronary artery disease with a Taxis drug-eluting stent placed in the ostial LAD in 2007, with subsequent in acute stent thrombosis requiring emergent bypass surgery. No catheterization has been performed since his CABG. In 2021, his ejection fraction was noted to be less than 20%, leading to ICD placement, which has not delivered any defibrillations. The defibrillator lead fracture previously, necessitating a new lead and battery replacement. He recalls waking up during the procedure when the new defibrillator was being placed behind the muscle, as the initial placement below the skin was uncomfortable.  He is currently on Entresto  (49/51 mg), carvedilol , spironolactone , and Farxiga , all added in 2023. He also takes baby aspirin  and Eliquis  5 mg twice daily for atrial fibrillation. No bleeding issues are reported, and he bruises less now compared to when he was on Plavix . He has been seen by an advanced heart failure clinic in the past.  He experiences occasional shortness of breath upon waking, attributed to shallow breathing. He tries to keep his heart rate below 120 bpm during physical activities to avoid triggering his ICD, which is set to activate at 200 bpm. He sweats easily and needs to maintain adequate hydration to prevent episodes of irregular heartbeats and difficulty breathing, which have occurred  twice when he overexerted himself. He manages these symptoms by sleeping in a recliner when they occur.  He is concerned about the cost of his medications, noting that his wife manages to reduce costs through coupons. He is worried about the risk of stroke, as he has had friends who have experienced strokes, and his father had carotid artery surgeries twice.  No major swelling in his legs and no significant chest pain or shortness of breath, although he acknowledges a decrease in his daily activity capacity as he ages.      Studies Reviewed: Aaron Aas    EKG Interpretation Date/Time:  Wednesday Jan 21 2024 14:46:59 EDT Ventricular Rate:  67 PR Interval:  208 QRS Duration:  174 QT Interval:  470 QTC Calculation: 496 R Axis:   -74  Text Interpretation: Normal sinus rhythm Right bundle branch block Left anterior fascicular block Bifascicular block Left ventricular hypertrophy with repolarization abnormality ( R in aVL , Romhilt-Estes ) When compared with ECG of 18-Jan-2020 11:55, No significant change was found Confirmed by Dorothye Gathers ([id number]) on 01/21/2024 3:01:09 PM    Results LABS LDL: 68 mg/dL Hb: 17 g/dL Cr: 1.2 mg/dL  DIAGNOSTIC EKG: Normal rhythm, right bundle branch block Stress test (2021): Stable Echocardiogram (2021): Ejection fraction 20%  Risk Assessment/Calculations:            Physical Exam:   VS:  BP 122/75   Pulse 67   Ht 6' (1.829 m)   Wt 213 lb (96.6 kg)   SpO2 91%   BMI 28.89 kg/m    Wt Readings from Last 3 Encounters:  01/21/24 213 lb (96.6 kg)  12/30/22 213 lb (96.6 kg)  12/02/22 208 lb (94.3 kg)    GEN: Well nourished, well developed in no acute distress NECK: No JVD; No carotid bruits CARDIAC: RRR, no murmurs, no rubs, no gallops RESPIRATORY:  Clear to auscultation without rales, wheezing or rhonchi  ABDOMEN: Soft, non-tender, non-distended EXTREMITIES:  No edema; No deformity   ASSESSMENT AND PLAN: .    Assessment and Plan Assessment &  Plan Chronic systolic heart failure Chronic systolic heart failure with an ejection fraction of approximately 20% since 2021. Currently well-managed with no significant symptoms such as dyspnea or fluid overload. On optimal medical therapy including Entresto , carvedilol , spironolactone , and Farxiga . No recent hospitalizations or exacerbations. Discussed potential future interventions such as LVADs or heart transplants if condition deteriorates, but not indicated at present. He is aware of signs of worsening heart failure, such as increased dyspnea and fluid retention, and understands the importance of monitoring these symptoms. - Order echocardiogram to assess current ejection fraction and cardiac function.  Atrial fibrillation Atrial fibrillation managed with Eliquis  5 mg twice daily. No recent episodes. Heart rate control appears adequate with carvedilol . No bleeding issues with anticoagulation therapy. He monitors heart rate during activities to maintain it below 120 bpm.  Coronary artery disease with history of CABG and stent Coronary artery disease with a history of CABG and a drug-eluting stent placed in 2007. No recent catheterizations since CABG. On aspirin  81 mg daily for secondary prevention. LDL cholesterol well-controlled at 68 mg/dL with rosuvastatin. No recent angina or ischemic symptoms. He is concerned about stroke risk and has a family history of carotid artery disease. - Order carotid Doppler ultrasound to assess for carotid artery disease.         We will follow-up with results of study  Signed, Dorothye Gathers, MD

## 2024-01-29 ENCOUNTER — Encounter: Payer: Self-pay | Admitting: Cardiology

## 2024-01-29 MED ORDER — ENTRESTO 49-51 MG PO TABS: 1.0000 | ORAL_TABLET | Freq: Two times a day (BID) | ORAL | 3 refills | Status: AC

## 2024-01-31 NOTE — Progress Notes (Unsigned)
  Electrophysiology Office Note:   Date:  02/02/2024  ID:  [name] [name], [name] [name] 02/24/1961, MRN [id number]  Primary Cardiologist: Dorothye Gathers, MD Primary Heart Failure: Jules Oar, MD Electrophysiologist: Manya Sells, MD       History of Present Illness:   [name] [name] is a 63 y.o. male with h/o AF, HFrEF / ICM s/p ICD, CAD s/p MI & CABG seen today for routine electrophysiology followup.   Since last being seen in our clinic the patient reports doing very well. No device related concerns. He reports his insurance company is now charging 230$ per month for home monitoring. He would prefer to visit every 6 months in clinic.     He denies chest pain, palpitations, dyspnea, PND, orthopnea, nausea, vomiting, dizziness, syncope, edema, weight gain, or early satiety.   Review of systems complete and found to be negative unless listed in HPI.    EP Information / Studies Reviewed:    EKG is not ordered today. EKG from 01/21/24 reviewed which showed NSR, RBBB, LAFB 67 bpm      ICD Interrogation-  reviewed in detail today,  See PACEART report.  Device History: Medtronic Single Chamber ICD implanted 05/16/06 for HFrEF Generator Change > 09/06/15 History of appropriate therapy: No History of AAD therapy: No   Risk Assessment/Calculations:    CHA2DS2-VASc Score = 2   This indicates a 2.2% annual risk of stroke. The patient's score is based upon: CHF History: 1 HTN History: 0 Diabetes History: 0 Stroke History: 0 Vascular Disease History: 1 Age Score: 0 Gender Score: 0             Physical Exam:   VS:  BP 102/66   Pulse 66   Ht 6' (1.829 m)   Wt 213 lb 6.4 oz (96.8 kg)   SpO2 94%   BMI 28.94 kg/m    Wt Readings from Last 3 Encounters:  02/02/24 213 lb 6.4 oz (96.8 kg)  01/21/24 213 lb (96.6 kg)  12/30/22 213 lb (96.6 kg)     GEN: Well nourished, well developed in no acute distress NECK: No JVD; No carotid bruits CARDIAC: Regular rate and rhythm, no murmurs,  rubs, gallops RESPIRATORY:  Clear to auscultation without rales, wheezing or rhonchi  ABDOMEN: Soft, non-tender, non-distended EXTREMITIES:  No edema; No deformity   ASSESSMENT AND PLAN:    Chronic Systolic Dysfunction / ICM s/p Medtronic single chamber ICD  -euvolemic on exam / by device   -Stable on an appropriate medical regimen / GDMT  -follows with Advanced HF Team  -Normal ICD function -See Pace Art report -No programming changes -reviewed with device clinic > pt to unplug device at home but keep it if he has issues and can plug back in for remote on an as needed basis. He is aware of how to complete an unscheduled remote.   Paroxysmal Atrial Fibrillation Prior notes show couple episodes lasting over 10 hours -CHA2DS2-VASc 2 -if recurrent AF, refer for AF ablation  -no AF events since 04/2023 -AF burden on device 0.3%  Secondary Hypercoagulable State  -continue Eliquis  5mg  BID, dose reviewed and appropriate by age / wt   CAD s/p MI & CABG  -no anginal symptoms     Hypertension  -well controlled on current regimen     Disposition:   Follow up with Dr. Carolynne Citron or EP APP in 6 months   Signed, Creighton Doffing, NP-C, AGACNP-BC [facility] - Electrophysiology  02/02/2024, 4:25 PM

## 2024-02-02 ENCOUNTER — Encounter: Payer: BC Managed Care – PPO | Admitting: Physician Assistant

## 2024-02-02 ENCOUNTER — Encounter: Payer: Self-pay | Admitting: Pulmonary Disease

## 2024-02-02 ENCOUNTER — Ambulatory Visit: Payer: Self-pay | Attending: Internal Medicine | Admitting: Pulmonary Disease

## 2024-02-02 VITALS — BP 102/66 | HR 66 | Ht 72.0 in | Wt 213.4 lb

## 2024-02-02 DIAGNOSIS — I255 Ischemic cardiomyopathy: Secondary | ICD-10-CM

## 2024-02-02 DIAGNOSIS — I10 Essential (primary) hypertension: Secondary | ICD-10-CM

## 2024-02-02 DIAGNOSIS — Z95810 Presence of automatic (implantable) cardiac defibrillator: Secondary | ICD-10-CM

## 2024-02-02 DIAGNOSIS — I480 Paroxysmal atrial fibrillation: Secondary | ICD-10-CM

## 2024-02-02 DIAGNOSIS — I5022 Chronic systolic (congestive) heart failure: Secondary | ICD-10-CM

## 2024-02-02 LAB — CUP PACEART INCLINIC DEVICE CHECK
Date Time Interrogation Session: 20250609162736
Implantable Lead Connection Status: 753985
Implantable Lead Implant Date: 20070921
Implantable Lead Location: 753860
Implantable Lead Model: 6947
Implantable Pulse Generator Implant Date: 20170111

## 2024-02-02 NOTE — Patient Instructions (Signed)
 Medication Instructions:  Your physician recommends that you continue on your current medications as directed. Please refer to the Current Medication list given to you today.  *If you need a refill on your cardiac medications before your next appointment, please call your pharmacy*  Lab Work: If you have labs (blood work) drawn today and your tests are completely normal, you will receive your results only by: MyChart Message (if you have MyChart) OR A paper copy in the mail If you have any lab test that is abnormal or we need to change your treatment, we will call you to review the results.  Testing/Procedures: None ordered today.  Follow-Up: At [facility], you and your health needs are our priority.  As part of our continuing mission to provide you with exceptional heart care, our providers are all part of one team.  This team includes your primary Cardiologist (physician) and Advanced Practice Providers or APPs (Physician Assistants and Nurse Practitioners) who all work together to provide you with the care you need, when you need it.  Your next appointment:   6 month(s)  Provider:   You may see Creighton Doffing, NP  We recommend signing up for the patient portal called "MyChart".  Sign up information is provided on this After Visit Summary.  MyChart is used to connect with patients for Virtual Visits (Telemedicine).  Patients are able to view lab/test results, encounter notes, upcoming appointments, etc.  Non-urgent messages can be sent to your provider as well.   To learn more about what you can do with MyChart, go to [email or web address].

## 2024-02-08 ENCOUNTER — Other Ambulatory Visit: Payer: Self-pay | Admitting: Internal Medicine

## 2024-02-08 DIAGNOSIS — I480 Paroxysmal atrial fibrillation: Secondary | ICD-10-CM

## 2024-02-09 NOTE — Telephone Encounter (Signed)
 Prescription refill request for Eliquis  received. Indication:afib Last office visit:6/25 Scr:1.43  9/24 Age: 63 Weight:96.8  kg  Prescription refilled

## 2024-02-27 ENCOUNTER — Other Ambulatory Visit: Payer: Self-pay | Admitting: Interventional Cardiology

## 2024-03-01 ENCOUNTER — Other Ambulatory Visit: Payer: Self-pay | Admitting: Cardiology

## 2024-03-01 ENCOUNTER — Other Ambulatory Visit: Payer: Self-pay

## 2024-03-01 MED ORDER — DAPAGLIFLOZIN PROPANEDIOL 10 MG PO TABS: 10.0000 mg | ORAL_TABLET | Freq: Every day | ORAL | 3 refills | Status: AC

## 2024-03-02 ENCOUNTER — Other Ambulatory Visit: Payer: Self-pay | Admitting: Interventional Cardiology

## 2024-03-03 ENCOUNTER — Encounter: Payer: Self-pay | Admitting: Cardiology

## 2024-03-05 ENCOUNTER — Telehealth: Payer: Self-pay

## 2024-03-05 ENCOUNTER — Other Ambulatory Visit (HOSPITAL_COMMUNITY): Payer: Self-pay

## 2024-03-05 NOTE — Telephone Encounter (Signed)
 Pharmacy Patient Advocate Encounter  Insurance verification completed.   The patient is insured through [facility]   Ran test claim for FARXIGA . Currently a quantity of 30 is a 30 day supply and the co-pay is $30 . The current 30 day co-pay is, $30.  No PA needed at this time.  This test claim was processed through [facility]- copay amounts may vary at other pharmacies due to pharmacy/plan contracts, or as the patient moves through the different stages of their insurance plan.      Pharmacy provided with processing information.

## 2024-03-05 NOTE — Telephone Encounter (Addendum)
 ERROR

## 2024-03-05 NOTE — Telephone Encounter (Signed)
 Great. Thank you.

## 2024-03-05 NOTE — Telephone Encounter (Signed)
 Addressed with pharmacy. They needed to run it for brand.

## 2024-03-05 NOTE — Telephone Encounter (Signed)
 Pharmacy said they will notify patient as soon as the fill is ready for pickup.

## 2024-03-05 NOTE — Telephone Encounter (Signed)
 Do we need to contact the patient?

## 2024-03-08 ENCOUNTER — Ambulatory Visit (HOSPITAL_BASED_OUTPATIENT_CLINIC_OR_DEPARTMENT_OTHER)
Admission: RE | Admit: 2024-03-08 | Discharge: 2024-03-08 | Disposition: A | Discharge status: Home / Self Care | Source: Ambulatory Visit | Attending: Cardiology | Admitting: Cardiology

## 2024-03-08 ENCOUNTER — Ambulatory Visit (HOSPITAL_COMMUNITY)
Admission: RE | Admit: 2024-03-08 | Discharge: 2024-03-08 | Disposition: A | Discharge status: Home / Self Care | Source: Ambulatory Visit | Attending: Cardiology | Admitting: Cardiology

## 2024-03-08 DIAGNOSIS — R0989 Other specified symptoms and signs involving the circulatory and respiratory systems: Secondary | ICD-10-CM | POA: Diagnosis not present

## 2024-03-08 DIAGNOSIS — I5022 Chronic systolic (congestive) heart failure: Secondary | ICD-10-CM | POA: Diagnosis not present

## 2024-03-08 LAB — ECHOCARDIOGRAM COMPLETE
AR max vel: 3.27 cm2
AV Area VTI: 3.03 cm2
AV Area mean vel: 3.04 cm2
AV Mean grad: 2 mmHg
AV Peak grad: 3.1 mmHg
Ao pk vel: 0.88 m/s
Area-P 1/2: 2.83 cm2
Est EF: <20
MV M vel: 1.96 m/s
MV Peak grad: 15.4 mmHg
Radius: 0.55 cm
S' Lateral: 5.96 cm

## 2024-03-08 MED ORDER — PERFLUTREN LIPID MICROSPHERE
1.0000 mL | INTRAVENOUS | Status: AC | PRN
  Administered 2024-03-08: 3 mL via INTRAVENOUS

## 2024-03-09 ENCOUNTER — Ambulatory Visit: Payer: Self-pay | Admitting: Cardiology

## 2024-03-17 DIAGNOSIS — L578 Other skin changes due to chronic exposure to nonionizing radiation: Secondary | ICD-10-CM | POA: Diagnosis not present

## 2024-03-17 DIAGNOSIS — D229 Melanocytic nevi, unspecified: Secondary | ICD-10-CM | POA: Diagnosis not present

## 2024-03-17 DIAGNOSIS — L570 Actinic keratosis: Secondary | ICD-10-CM | POA: Diagnosis not present

## 2024-03-17 DIAGNOSIS — L820 Inflamed seborrheic keratosis: Secondary | ICD-10-CM | POA: Diagnosis not present

## 2024-03-17 DIAGNOSIS — L814 Other melanin hyperpigmentation: Secondary | ICD-10-CM | POA: Diagnosis not present

## 2024-03-17 DIAGNOSIS — L821 Other seborrheic keratosis: Secondary | ICD-10-CM | POA: Diagnosis not present

## 2024-05-05 DIAGNOSIS — H2513 Age-related nuclear cataract, bilateral: Secondary | ICD-10-CM | POA: Diagnosis not present

## 2024-05-05 DIAGNOSIS — H43811 Vitreous degeneration, right eye: Secondary | ICD-10-CM | POA: Diagnosis not present

## 2024-05-05 DIAGNOSIS — H2512 Age-related nuclear cataract, left eye: Secondary | ICD-10-CM | POA: Diagnosis not present

## 2024-05-05 DIAGNOSIS — H2511 Age-related nuclear cataract, right eye: Secondary | ICD-10-CM | POA: Diagnosis not present

## 2024-05-10 DIAGNOSIS — L570 Actinic keratosis: Secondary | ICD-10-CM | POA: Diagnosis not present

## 2024-05-10 DIAGNOSIS — L820 Inflamed seborrheic keratosis: Secondary | ICD-10-CM | POA: Diagnosis not present

## 2024-05-25 DIAGNOSIS — Z125 Encounter for screening for malignant neoplasm of prostate: Secondary | ICD-10-CM | POA: Diagnosis not present

## 2024-05-25 DIAGNOSIS — E039 Hypothyroidism, unspecified: Secondary | ICD-10-CM | POA: Diagnosis not present

## 2024-05-25 DIAGNOSIS — E7800 Pure hypercholesterolemia, unspecified: Secondary | ICD-10-CM | POA: Diagnosis not present

## 2024-05-25 DIAGNOSIS — I10 Essential (primary) hypertension: Secondary | ICD-10-CM | POA: Diagnosis not present

## 2024-06-17 DIAGNOSIS — I10 Essential (primary) hypertension: Secondary | ICD-10-CM | POA: Diagnosis not present

## 2024-06-17 DIAGNOSIS — I2510 Atherosclerotic heart disease of native coronary artery without angina pectoris: Secondary | ICD-10-CM | POA: Diagnosis not present

## 2024-06-17 DIAGNOSIS — E7800 Pure hypercholesterolemia, unspecified: Secondary | ICD-10-CM | POA: Diagnosis not present

## 2024-06-17 DIAGNOSIS — Z23 Encounter for immunization: Secondary | ICD-10-CM | POA: Diagnosis not present

## 2024-06-17 DIAGNOSIS — Z0000 Encounter for general adult medical examination without abnormal findings: Secondary | ICD-10-CM | POA: Diagnosis not present

## 2024-06-17 DIAGNOSIS — E039 Hypothyroidism, unspecified: Secondary | ICD-10-CM | POA: Diagnosis not present

## 2024-07-26 ENCOUNTER — Encounter: Payer: Self-pay | Admitting: Internal Medicine

## 2024-07-28 ENCOUNTER — Ambulatory Visit: Attending: Internal Medicine

## 2024-07-28 DIAGNOSIS — I5022 Chronic systolic (congestive) heart failure: Secondary | ICD-10-CM

## 2024-07-29 LAB — CUP PACEART REMOTE DEVICE CHECK
Battery Remaining Longevity: 40 mo
Battery Voltage: 2.97 V
Brady Statistic RV Percent Paced: 0.67 %
Date Time Interrogation Session: 20251203131140
HighPow Impedance: 44 Ohm
HighPow Impedance: 56 Ohm
Implantable Lead Connection Status: 753985
Implantable Lead Implant Date: 20070921
Implantable Lead Location: 753860
Implantable Lead Model: 6947
Implantable Pulse Generator Implant Date: 20170111
Lead Channel Impedance Value: 456 Ohm
Lead Channel Impedance Value: 513 Ohm
Lead Channel Pacing Threshold Amplitude: 0.5 V
Lead Channel Pacing Threshold Pulse Width: 0.4 ms
Lead Channel Sensing Intrinsic Amplitude: 22 mV
Lead Channel Sensing Intrinsic Amplitude: 22 mV
Lead Channel Setting Pacing Amplitude: 2.5 V
Lead Channel Setting Pacing Pulse Width: 0.4 ms
Lead Channel Setting Sensing Sensitivity: 0.3 mV
Zone Setting Status: 755011
Zone Setting Status: 755011

## 2024-08-01 ENCOUNTER — Ambulatory Visit: Payer: Self-pay | Admitting: Internal Medicine

## 2024-08-03 NOTE — Progress Notes (Signed)
 Remote ICD Transmission

## 2024-08-04 ENCOUNTER — Encounter: Payer: Self-pay | Admitting: Internal Medicine

## 2024-08-04 ENCOUNTER — Other Ambulatory Visit: Payer: Self-pay

## 2024-08-04 DIAGNOSIS — I480 Paroxysmal atrial fibrillation: Secondary | ICD-10-CM

## 2024-08-04 MED ORDER — APIXABAN 5 MG PO TABS: 5.0000 mg | ORAL_TABLET | Freq: Two times a day (BID) | ORAL | 1 refills | Status: AC

## 2024-08-04 NOTE — Telephone Encounter (Signed)
 Prescription refill request for Eliquis  received. Indication:afib Last office visit:6/25 Scr: 1.2  2025 Age:63 Weight:96.8  kg  Prescription refilled

## 2024-09-14 ENCOUNTER — Ambulatory Visit: Admitting: Cardiology

## 2024-09-24 ENCOUNTER — Ambulatory Visit: Admitting: Student in an Organized Health Care Education/Training Program

## 2024-09-28 ENCOUNTER — Other Ambulatory Visit: Payer: Self-pay | Admitting: Pulmonary Disease

## 2024-09-29 MED ORDER — NITROGLYCERIN 0.4 MG SL SUBL: 0.4000 mg | SUBLINGUAL_TABLET | SUBLINGUAL | 2 refills | Status: AC | PRN

## 2024-10-27 ENCOUNTER — Ambulatory Visit

## 2024-11-12 ENCOUNTER — Ambulatory Visit: Admitting: Student in an Organized Health Care Education/Training Program

## 2025-01-26 ENCOUNTER — Ambulatory Visit

## 2025-04-27 ENCOUNTER — Ambulatory Visit

## 2025-07-27 ENCOUNTER — Ambulatory Visit

## 2025-10-26 ENCOUNTER — Ambulatory Visit
